# Patient Record
Sex: Male | Born: 1935 | Race: White | Hispanic: No | Smoking: Former smoker
Health system: Southern US, Community
[De-identification: ages and names within clinical notes are randomized; demographics above are authoritative.]

## PROBLEM LIST (undated history)

## (undated) DIAGNOSIS — R112 Nausea with vomiting, unspecified: Secondary | ICD-10-CM

## (undated) DIAGNOSIS — I951 Orthostatic hypotension: Secondary | ICD-10-CM

## (undated) DIAGNOSIS — Z9989 Dependence on other enabling machines and devices: Secondary | ICD-10-CM

## (undated) DIAGNOSIS — Z950 Presence of cardiac pacemaker: Secondary | ICD-10-CM

## (undated) DIAGNOSIS — I714 Abdominal aortic aneurysm, without rupture, unspecified: Secondary | ICD-10-CM

## (undated) DIAGNOSIS — Z9889 Other specified postprocedural states: Secondary | ICD-10-CM

## (undated) DIAGNOSIS — M549 Dorsalgia, unspecified: Secondary | ICD-10-CM

## (undated) DIAGNOSIS — K219 Gastro-esophageal reflux disease without esophagitis: Secondary | ICD-10-CM

## (undated) DIAGNOSIS — I10 Essential (primary) hypertension: Secondary | ICD-10-CM

## (undated) DIAGNOSIS — G4730 Sleep apnea, unspecified: Secondary | ICD-10-CM

## (undated) DIAGNOSIS — M84376A Stress fracture, unspecified foot, initial encounter for fracture: Secondary | ICD-10-CM

## (undated) DIAGNOSIS — M7989 Other specified soft tissue disorders: Secondary | ICD-10-CM

## (undated) DIAGNOSIS — I4891 Unspecified atrial fibrillation: Secondary | ICD-10-CM

## (undated) DIAGNOSIS — N400 Enlarged prostate without lower urinary tract symptoms: Secondary | ICD-10-CM

## (undated) DIAGNOSIS — G8929 Other chronic pain: Secondary | ICD-10-CM

## (undated) HISTORY — DX: Unspecified atrial fibrillation: I48.91

## (undated) HISTORY — DX: Orthostatic hypotension: I95.1

## (undated) HISTORY — PX: HERNIA REPAIR: SHX51

## (undated) HISTORY — PX: SHOULDER SURGERY: SHX246

## (undated) HISTORY — PX: WRIST FRACTURE SURGERY: SHX121

## (undated) HISTORY — DX: Abdominal aortic aneurysm, without rupture: I71.4

## (undated) HISTORY — DX: Abdominal aortic aneurysm, without rupture, unspecified: I71.40

## (undated) HISTORY — DX: Dependence on other enabling machines and devices: Z99.89

## (undated) HISTORY — DX: Presence of cardiac pacemaker: Z95.0

---

## 2003-02-22 ENCOUNTER — Inpatient Hospital Stay (HOSPITAL_COMMUNITY): Admission: AD | Admit: 2003-02-22 | Discharge: 2003-02-23 | Payer: Self-pay | Admitting: *Deleted

## 2003-09-20 ENCOUNTER — Encounter: Admission: RE | Admit: 2003-09-20 | Discharge: 2003-09-20 | Payer: Self-pay | Admitting: Orthopaedic Surgery

## 2003-11-10 ENCOUNTER — Ambulatory Visit (HOSPITAL_COMMUNITY): Admission: RE | Admit: 2003-11-10 | Discharge: 2003-11-10 | Payer: Self-pay | Admitting: Orthopaedic Surgery

## 2003-11-10 ENCOUNTER — Ambulatory Visit (HOSPITAL_BASED_OUTPATIENT_CLINIC_OR_DEPARTMENT_OTHER): Admission: RE | Admit: 2003-11-10 | Discharge: 2003-11-10 | Payer: Self-pay | Admitting: Orthopaedic Surgery

## 2003-12-21 ENCOUNTER — Ambulatory Visit: Payer: Self-pay | Admitting: Cardiology

## 2004-01-19 ENCOUNTER — Ambulatory Visit: Payer: Self-pay | Admitting: Cardiology

## 2004-02-16 ENCOUNTER — Ambulatory Visit: Payer: Self-pay | Admitting: Cardiology

## 2004-03-15 ENCOUNTER — Ambulatory Visit: Payer: Self-pay | Admitting: Cardiology

## 2004-03-30 ENCOUNTER — Ambulatory Visit (HOSPITAL_BASED_OUTPATIENT_CLINIC_OR_DEPARTMENT_OTHER): Admission: RE | Admit: 2004-03-30 | Discharge: 2004-03-30 | Payer: Self-pay | Admitting: Orthopaedic Surgery

## 2004-03-30 ENCOUNTER — Ambulatory Visit (HOSPITAL_COMMUNITY): Admission: RE | Admit: 2004-03-30 | Discharge: 2004-03-30 | Payer: Self-pay | Admitting: Orthopaedic Surgery

## 2004-04-12 ENCOUNTER — Ambulatory Visit: Payer: Self-pay | Admitting: Cardiology

## 2004-04-19 ENCOUNTER — Ambulatory Visit: Payer: Self-pay | Admitting: Cardiology

## 2004-04-26 ENCOUNTER — Ambulatory Visit: Payer: Self-pay | Admitting: Cardiology

## 2004-05-23 ENCOUNTER — Ambulatory Visit: Payer: Self-pay | Admitting: Cardiology

## 2004-05-30 ENCOUNTER — Ambulatory Visit: Payer: Self-pay | Admitting: Cardiology

## 2004-06-13 ENCOUNTER — Ambulatory Visit: Payer: Self-pay | Admitting: Cardiology

## 2004-06-22 ENCOUNTER — Ambulatory Visit: Payer: Self-pay | Admitting: Cardiology

## 2004-06-28 ENCOUNTER — Ambulatory Visit: Payer: Self-pay | Admitting: Cardiology

## 2004-07-06 ENCOUNTER — Ambulatory Visit: Payer: Self-pay | Admitting: Cardiology

## 2004-07-18 ENCOUNTER — Ambulatory Visit: Payer: Self-pay | Admitting: Cardiology

## 2004-08-01 ENCOUNTER — Ambulatory Visit: Payer: Self-pay | Admitting: Cardiology

## 2004-08-08 ENCOUNTER — Ambulatory Visit: Payer: Self-pay | Admitting: Cardiology

## 2004-08-22 ENCOUNTER — Ambulatory Visit: Payer: Self-pay | Admitting: Cardiology

## 2004-09-19 ENCOUNTER — Ambulatory Visit: Payer: Self-pay | Admitting: Cardiology

## 2004-10-03 ENCOUNTER — Ambulatory Visit: Payer: Self-pay | Admitting: Cardiology

## 2004-10-24 ENCOUNTER — Ambulatory Visit: Payer: Self-pay | Admitting: Cardiology

## 2004-11-21 ENCOUNTER — Ambulatory Visit: Payer: Self-pay | Admitting: Cardiology

## 2004-11-28 ENCOUNTER — Ambulatory Visit: Payer: Self-pay | Admitting: Cardiology

## 2004-12-26 ENCOUNTER — Ambulatory Visit: Payer: Self-pay | Admitting: Cardiology

## 2005-01-23 ENCOUNTER — Ambulatory Visit: Payer: Self-pay | Admitting: Cardiology

## 2005-02-20 ENCOUNTER — Ambulatory Visit: Payer: Self-pay | Admitting: Cardiology

## 2005-03-20 ENCOUNTER — Ambulatory Visit: Payer: Self-pay | Admitting: Cardiology

## 2005-04-16 ENCOUNTER — Ambulatory Visit: Payer: Self-pay | Admitting: Cardiology

## 2005-05-15 ENCOUNTER — Ambulatory Visit: Payer: Self-pay | Admitting: Cardiology

## 2005-05-17 ENCOUNTER — Ambulatory Visit (HOSPITAL_COMMUNITY): Admission: RE | Admit: 2005-05-17 | Discharge: 2005-05-17 | Payer: Self-pay | Admitting: Unknown Physician Specialty

## 2005-05-20 ENCOUNTER — Ambulatory Visit: Payer: Self-pay | Admitting: Cardiology

## 2005-06-13 ENCOUNTER — Ambulatory Visit: Payer: Self-pay | Admitting: Cardiology

## 2005-07-15 ENCOUNTER — Ambulatory Visit: Payer: Self-pay | Admitting: Cardiology

## 2005-07-25 ENCOUNTER — Ambulatory Visit: Payer: Self-pay | Admitting: Cardiology

## 2006-03-12 ENCOUNTER — Encounter: Payer: Self-pay | Admitting: Cardiology

## 2006-05-09 ENCOUNTER — Encounter: Payer: Self-pay | Admitting: Cardiology

## 2006-08-26 ENCOUNTER — Ambulatory Visit: Payer: Self-pay | Admitting: Cardiology

## 2007-09-21 ENCOUNTER — Ambulatory Visit: Payer: Self-pay | Admitting: Cardiology

## 2008-02-22 ENCOUNTER — Encounter: Payer: Self-pay | Admitting: Cardiology

## 2008-02-25 ENCOUNTER — Encounter: Payer: Self-pay | Admitting: Cardiology

## 2008-04-08 ENCOUNTER — Encounter: Payer: Self-pay | Admitting: Cardiology

## 2008-04-12 ENCOUNTER — Ambulatory Visit: Payer: Self-pay | Admitting: Cardiology

## 2008-04-24 ENCOUNTER — Encounter: Payer: Self-pay | Admitting: Cardiology

## 2008-06-13 ENCOUNTER — Ambulatory Visit: Payer: Self-pay | Admitting: Cardiology

## 2008-09-21 DIAGNOSIS — I714 Abdominal aortic aneurysm, without rupture, unspecified: Secondary | ICD-10-CM | POA: Insufficient documentation

## 2008-09-21 DIAGNOSIS — I951 Orthostatic hypotension: Secondary | ICD-10-CM | POA: Insufficient documentation

## 2008-09-21 DIAGNOSIS — I4891 Unspecified atrial fibrillation: Secondary | ICD-10-CM | POA: Insufficient documentation

## 2008-11-17 ENCOUNTER — Encounter: Payer: Self-pay | Admitting: Cardiology

## 2008-11-17 DIAGNOSIS — R935 Abnormal findings on diagnostic imaging of other abdominal regions, including retroperitoneum: Secondary | ICD-10-CM

## 2008-12-26 ENCOUNTER — Encounter: Payer: Self-pay | Admitting: Cardiology

## 2009-01-24 ENCOUNTER — Ambulatory Visit: Payer: Self-pay | Admitting: Cardiology

## 2009-01-24 DIAGNOSIS — K449 Diaphragmatic hernia without obstruction or gangrene: Secondary | ICD-10-CM | POA: Insufficient documentation

## 2009-01-24 DIAGNOSIS — E785 Hyperlipidemia, unspecified: Secondary | ICD-10-CM

## 2009-02-03 ENCOUNTER — Encounter: Payer: Self-pay | Admitting: Cardiology

## 2009-02-09 ENCOUNTER — Encounter: Payer: Self-pay | Admitting: Cardiology

## 2009-02-20 ENCOUNTER — Encounter (INDEPENDENT_AMBULATORY_CARE_PROVIDER_SITE_OTHER): Payer: Self-pay | Admitting: *Deleted

## 2009-03-03 ENCOUNTER — Encounter: Payer: Self-pay | Admitting: Cardiology

## 2009-03-30 ENCOUNTER — Ambulatory Visit: Payer: Self-pay | Admitting: Vascular Surgery

## 2009-04-05 ENCOUNTER — Encounter: Payer: Self-pay | Admitting: Vascular Surgery

## 2009-04-06 ENCOUNTER — Encounter: Payer: Self-pay | Admitting: Cardiology

## 2009-04-17 ENCOUNTER — Encounter: Payer: Self-pay | Admitting: Cardiology

## 2009-05-10 ENCOUNTER — Ambulatory Visit: Payer: Self-pay | Admitting: Cardiology

## 2009-05-10 DIAGNOSIS — R609 Edema, unspecified: Secondary | ICD-10-CM | POA: Insufficient documentation

## 2009-11-07 ENCOUNTER — Telehealth (INDEPENDENT_AMBULATORY_CARE_PROVIDER_SITE_OTHER): Payer: Self-pay | Admitting: *Deleted

## 2009-11-14 ENCOUNTER — Ambulatory Visit: Payer: Self-pay | Admitting: Cardiology

## 2009-11-23 ENCOUNTER — Ambulatory Visit (HOSPITAL_COMMUNITY): Admission: RE | Admit: 2009-11-23 | Discharge: 2009-11-23 | Payer: Self-pay | Admitting: Urology

## 2009-12-04 ENCOUNTER — Ambulatory Visit: Payer: Self-pay | Admitting: Cardiology

## 2010-01-28 HISTORY — PX: CHOLECYSTECTOMY: SHX55

## 2010-02-27 NOTE — Assessment & Plan Note (Addendum)
Summary: 6 mo fu   Visit Type:  Follow-up Primary Provider:  [facility]   History of Present Illness: the patient is an 77 year old male with no prior history of coronary artery disease. The patient had a catheterization 2005 which showed nonobstructive coronary disease. He had a recent ER visit with complaints of palpitations, dizziness and chest pain. He does have a history of orthostatic hypotension. He also has a history of paroxysmal A. fib ablation. The patient however was in normal sinus rhythm. His episode occurred in the middle of the night and he took a nitroglycerin tablet. This made him quite hypotensive. Laboratory work done in the ER was essentially within normal limits and the patient was set up for an outpatient stress test with his primary care physician. Myocardial perfusion imaging showed probably normal LV perfusion with an ejection fraction of 69% and 2 small fixed defects but no ischemia. The patient achieved 7 METs.  The patient also has a history of AAA for which he sees [place] for surgery. He has a history of mild leg edema and was given a prescription for a limited time of hydrochlorothiazide. Patient since his ER visit has had no recurrent palpitations. He also has had no shortness of breath.  Preventive Screening-Counseling & Management  Alcohol-Tobacco     Smoking Status: quit     Year Quit: 1956  Current Medications (verified): 1)  Nexium 40 Mg Cpdr (Esomeprazole Magnesium) .... Take 1 Tablet By Mouth Once A Day 2)  Aspirin 81 Mg Tbec (Aspirin) .... Take One Tablet By Mouth Daily 3)  Dorzolamide Hcl 2 % Soln (Dorzolamide Hcl) .... One Drop in The Right Eye Once Daily 4)  Xalatan 0.005 % Soln (Latanoprost) .... One Drop in The Right Eye Two Times A Day 5)  Vitamin D 400 Unit Caps (Cholecalciferol) .... Take 2 Tablet By Mouth Once A Day 6)  Proscar 5 Mg Tabs (Finasteride) .... Take 1 Tablet By Mouth Once A Day 7)  Nitrostat 0.4 Mg Subl (Nitroglycerin) .Marland Kitchen.. 1 Tablet Under  Tongue At Onset of Chest Pain; You May Repeat Every 5 Minutes For Up To 3 Doses. 8)  Multivitamins   Tabs (Multiple Vitamin) .Marland Kitchen.. 1 By Mouth Daily 9)  Fish Oil 1000 Mg Caps (Omega-3 Fatty Acids) .... Take 2 Tablet By Mouth Two Times A Day 10)  Pindolol 5 Mg Tabs (Pindolol) .... Take 1/2 Tab (2.5mg ) Daily  Allergies: 1)  ! Crestor 2)  ! Sulfa  Comments:  Nurse/Medical Assistant: The patient's medication list and allergies were reviewed with the patient and were updated in the Medication and Allergy Lists.  Past History:  Family History: Last updated: 01/24/2009 [place]  Social History: Last updated: 09/21/2008 Tobacco Use - No.  Alcohol Use - no  Risk Factors: Smoking Status: quit (12/04/2009)  Past Medical History: ORTHOSTATIC HYPOTENSION (ICD-458.0) ABDOMINAL AORTIC ANEURYSM (ICD-441.4) ATRIAL FIBRILLATION (ICD-427.31) Bicarbonate perfusion study October 2011 ejection fraction 69%. Small defects no ischemia. 7 metastases achieved. ER visit 1014 2011 ruled out for MI normal sinus rhythm orthostasis after sublingual nitroglycerin.    Review of Systems       The patient complains of chest pain, palpitations, and leg swelling.  The patient denies fatigue, malaise, fever, weight gain/loss, vision loss, decreased hearing, hoarseness, shortness of breath, prolonged cough, wheezing, sleep apnea, coughing up blood, abdominal pain, blood in stool, nausea, vomiting, diarrhea, heartburn, incontinence, blood in urine, muscle weakness, joint pain, rash, skin lesions, headache, fainting, dizziness, depression, anxiety, enlarged lymph nodes, easy bruising  or bleeding, and environmental allergies.    Vital Signs:  Patient profile:   78 year old male Height:      68 inches Weight:      191 pounds Pulse rate:   60 / minute Pulse (ortho):   70 / minute BP sitting:   137 / 82  (left arm) BP standing:   135 / 78 Cuff size:   large  Vitals Entered By: Carlye Grippe (December 04, 2009 8:47  AM)  Serial Vital Signs/Assessments:  Time      Position  BP       Pulse  Resp  Temp     By 9:31 AM   Lying RA  138/84   59                    Gayle [address] LPN 0:45 AM   Sitting   135/85   61                    [place], LPN 4:09 AM   Standing  135/78   70                    [place], LPN  Comments: 8:11 AM After 3 min:  131/81  66 After 5 min:  135/81  65 By: Hoover Brunette, LPN    Physical Exam  Additional Exam:  General: Well-developed, well-nourished in no distress head: Normocephalic and atraumatic eyes PERRLA/EOMI intact, conjunctiva and lids normal nose: No deformity or lesions mouth normal dentition, normal posterior pharynx neck: Supple, no JVD.  No masses, thyromegaly or abnormal cervical nodes lungs: Normal breath sounds bilaterally without wheezing.  Normal percussion heart: regular rate and rhythm with normal S1 and S2, no S3 or S4.  PMI is normal.  No pathological murmurs abdomen: Normal bowel sounds, abdomen is soft and nontender without masses, organomegaly or hernias noted.  No hepatosplenomegaly musculoskeletal: Back normal, normal gait muscle strength and tone normal pulsus: Pulse is normal in all 4 extremities Extremities: very mild low pitting edema of the lower extremities. neurologic: Alert and oriented x 3 skin: Intact without lesions or rashes cervical nodes: No significant adenopathy psychologic: Normal affect    Impression & Recommendations:  Problem # 1:  ORTHOSTATIC HYPOTENSION (ICD-458.0) patient hypotension after nitroglycerin. We will check orthostatics.  Problem # 2:  ATRIAL FIBRILLATION (ICD-427.31) patient reports palpitations. There is no evidence of recurrent atrial fibrillation. However he could benefit from a low dose prescription which pindolol 2.5 mg p.o. q. day Iwith the intrinsic sympathomimetic activity given his heart rate of 60 beats per minute.the patient is currently not on Coumadin. His updated medication list for this  problem includes:    Aspirin 81 Mg Tbec (Aspirin) .Marland Kitchen... Take one tablet by mouth daily    Pindolol 5 Mg Tabs (Pindolol) .Marland Kitchen... Take 1/2 tab (2.5mg ) daily  Problem # 3:  ABDOMINAL AORTIC ANEURYSM (ICD-441.4) follow up after surgery in [place]. Last measurement was 4.1 cm.  Patient Instructions: 1)  Pindolol 2.5mg  once daily  2)  Follow up in  6 months Prescriptions: PINDOLOL 5 MG TABS (PINDOLOL) take 1/2 tab (2.5mg ) daily  #15 x 6   Entered by:   Hoover Brunette, LPN   Authorized by:   Lewayne Bunting, MD, [facility]   Signed by:   Hoover Brunette, LPN on 91/47/8295   Method used:   Electronically to        [facility]* (retail)  [address]       [place], [place]  [address]       Ph: [phone]       Fax: [phone]   RxID:   [id number]   Handout requested.   Appended Document: 6 mo fu Patient at low risk for cardiovascular complications related to galbladder surgery. Can proceed with any furhter cardiac diagnostic testing.   Appended Document: 6 mo fu Correction on above.  Per Dr. Andee Lineman - can proceed without any further cardiac diagnostic testing.

## 2010-02-27 NOTE — Letter (Signed)
Summary: Engineer, materials at [facility]  [address]   [place], [place] [address]   Phone: [phone]  Fax: [phone]        February 20, 2009 MRN: [id number]   [name] [address] [place], [place]  [address]   Dear [name],  Your test ordered by Selena Batten has been reviewed by your physician (or physician assistant) and was found to be normal or stable. Your physician (or physician assistant) felt no changes were needed at this time.  ____ Echocardiogram  ____ Cardiac Stress Test  __X__ Lab Work  ____ Peripheral vascular study of arms, legs or neck  ____ CT scan or X-ray  ____ Lung or Breathing test  ____ Other:   Thank you.   Hoover Brunette, LPN    Duane Boston, M.D., F.A.C.C. Thressa Sheller, M.D., F.A.C.C. Oneal Grout, M.D., F.A.C.C. Cheree Ditto, M.D., F.A.C.C. Daiva Nakayama, M.D., F.A.C.C. Kenney Houseman, M.D., F.A.C.C. Jeanne Ivan, PA-C

## 2010-02-27 NOTE — Letter (Signed)
Summary: Appointment -missed  [facility] [address]   [place], [place] [address]   Phone: [phone]  Fax: [phone]     April 06, 2009 MRN: [id number]     [name] [address] [place], [place]  [address]     Dear [name],  Our records indicate you missed your appointment on April 06, 2009                        with Dr. Andee Lineman.   It is very important that we reach you to reschedule this appointment. We look forward to participating in your health care needs.   Please contact us at the number listed above at your earliest convenience to reschedule this appointment.   Sincerely,    Glass blower/designer

## 2010-02-27 NOTE — Assessment & Plan Note (Signed)
Summary: 3 MO FU R/S FROM NO SHOW   Visit Type:  Follow-up Primary Provider:  [facility]  CC:  Edema Lower extremities.  History of Present Illness: the patient is a 77 year old male history paroxysmal atrial fibrillation, history of DVT and PE. The patient also has a history of abdominal aortic aneurysm. Because of a flank pain and right groin pain a repeat CT scan was done. He was radiologist's impression that the patient's aneurysm had increased in size to 3.9 cm. However his most recent CT scan the patient had an aneurysm size of approximately 4.1 cm and therefore no definite change. There was evidence of thrombosis in the aneurysm but no evidence of dissection. The patient was seen in followup by vascular surgery. They felt his aneurysm was stable and recommended further followup in our practice. The patient denies any central chest pain shortness of breath orthopnea PND palpitations or syncope. He underwent a recent endoscopy by Dr. Karilyn Cota. The patient reports some lower extremity edema over the last several weeks. Otherwise he has remained stable from a cardiovascular perspective. He does report chronic back pain.  Clinical Review Panels:  Echocardiogram Echocardiogram 1. The left ventricular chamber size is normal.         2. Global left ventricular wall motion and contractility are within         normal limits.         3. The estimated ejection fraction is 60-65%.          4. The right ventricular global systolic function is normal.         5. The aortic valve structure is normal. (04/12/2008)  Cardiac Imaging Cardiac Cath Findings  1. Nonobstructive disease.  2. Atrial fibrillation.   PLAN:  Start the patient on the Diltiazem drip for rate control.  Consider  starting him on a permanent rate control agent as well as Coumadin and  attempt at DC cardioversion with heparin coverage over the next 24-hour  period.                                             Vida Roller, M.D.  (02/22/2003)    Current Medications (verified): 1)  Nexium 40 Mg Cpdr (Esomeprazole Magnesium) .... Take 1 Tablet By Mouth Two Times A Day  (Place On File) 2)  Aspirin 81 Mg Tbec (Aspirin) .... Take One Tablet By Mouth Daily 3)  Dorzolamide Hcl 2 % Soln (Dorzolamide Hcl) .... One Drop in The Right Eye Once Daily 4)  Xalatan 0.005 % Soln (Latanoprost) .... One Drop in The Right Eye Two Times A Day 5)  Vitamin D 1000 Unit Tabs (Cholecalciferol) .... One Tablet Two Times A Day 6)  Proscar 5 Mg Tabs (Finasteride) .... Take 1 Tablet By Mouth Once A Day 7)  Simvastatin 20 Mg Tabs (Simvastatin) .... Take 1 Tab By Mouth At Bedtime 8)  Nitrostat 0.4 Mg Subl (Nitroglycerin) .Marland Kitchen.. 1 Tablet Under Tongue At Onset of Chest Pain; You May Repeat Every 5 Minutes For Up To 3 Doses. 9)  Multivitamins   Tabs (Multiple Vitamin) .Marland Kitchen.. 1 By Mouth Daily 10)  Carafate 1 Gm Tabs (Sucralfate) .... As Directed  Allergies (verified): 1)  ! Crestor  Past History:  Past Medical History: Last updated: 09/21/2008 ORTHOSTATIC HYPOTENSION (ICD-458.0) ABDOMINAL AORTIC ANEURYSM (ICD-441.4) ATRIAL FIBRILLATION (ICD-427.31)    Review of Systems  The patient complains of abdominal pain and leg swelling.  The patient denies fatigue, malaise, fever, weight gain/loss, vision loss, decreased hearing, hoarseness, chest pain, palpitations, shortness of breath, prolonged cough, wheezing, sleep apnea, coughing up blood, blood in stool, nausea, vomiting, diarrhea, heartburn, incontinence, blood in urine, muscle weakness, joint pain, rash, skin lesions, headache, fainting, dizziness, depression, anxiety, enlarged lymph nodes, easy bruising or bleeding, and environmental allergies.    Vital Signs:  Patient profile:   77 year old male Height:      68 inches Weight:      188 pounds BMI:     28.69 Pulse rate:   58 / minute Resp:     16 per minute BP sitting:   137 / 80  (right arm)  Vitals Entered By: Marrion Coy, CNA  (May 10, 2009 10:54 AM)  Physical Exam  Additional Exam:  General: Well-developed, well-nourished in no distress head: Normocephalic and atraumatic eyes PERRLA/EOMI intact, conjunctiva and lids normal nose: No deformity or lesions mouth normal dentition, normal posterior pharynx neck: Supple, no JVD.  No masses, thyromegaly or abnormal cervical nodes lungs: Normal breath sounds bilaterally without wheezing.  Normal percussion heart: regular rate and rhythm with normal S1 and S2, no S3 or S4.  PMI is normal.  No pathological murmurs abdomen: Normal bowel sounds, abdomen is soft and nontender without masses, organomegaly or hernias noted.  No hepatosplenomegaly musculoskeletal: Back normal, normal gait muscle strength and tone normal pulsus: Pulse is normal in all 4 extremities Extremities: No peripheral pitting edema neurologic: Alert and oriented x 3 skin: Intact without lesions or rashes cervical nodes: No significant adenopathy psychologic: Normal affect    EKG  Procedure date:  05/10/2009  Findings:      sinus bradycardia. Nonspecific ST-T wave changes.  Impression & Recommendations:  Problem # 1:  ABDOMINAL AORTIC ANEURYSM (ICD-441.4) the patient has a stable aortic aneurysm. There is no evidence of dissection. His right flank and groin pain appears to be related to T11 T. 12 disc narrowing. The patient will have a followup abdominal ultrasound for AAA in 6 months and thereafter every 6 months.  Problem # 2:  HIATAL HERNIA WITH REFLUX (ICD-553.3) followed by gastroenterology His updated medication list for this problem includes:    Nexium 40 Mg Cpdr (Esomeprazole magnesium) .Marland Kitchen... Take 1 tablet by mouth two times a day  (place on file)    Carafate 1 Gm Tabs (Sucralfate) .Marland Kitchen... As directed  Problem # 3:  ATRIAL FIBRILLATION (ICD-427.31) no evidence of recurrent 8 fibrillation. The patient's electrocardiogram was reviewed and was notable for sinus bradycardia. His updated  medication list for this problem includes:    Aspirin 81 Mg Tbec (Aspirin) .Marland Kitchen... Take one tablet by mouth daily  Orders: EKG w/ Interpretation (93000)  Problem # 4:  LEG EDEMA (ICD-782.3) have given the patient a short-term prescription of low-dose hydrochlorothiazide 12.5 mg p.o. q. daily  Other Orders: T-Ultrasound Abdominal, Limited ([address])  Patient Instructions: 1)  In about six months just before your next visit, your physician has requested that you have an abdominal aorta duplex. During this test, an ultrasound is used to evaluate the aorta. Allow 30 minutes for this exam. Do not eat after midnight the day before and avoid carbonated beverages. There are no restrictions or special instructions. Lynden Ang will call and schedule this for you. Please call her in six months if you don't hear from [place].  2)  Your physician has recommended you make the following change in  your medication: START HYDROCHLORATHIAZIDE 12.5MG  DAILY TIMES 4 WEEKS ONLY. Please continue all other meds as before. Your new prescription has been sent to your pharmacy. 3)  Your physician wants you to follow-up in:6 months. You will receive a reminder letter in the mail about two months in advance. If you don't receive a letter, please call our office to schedule the follow-up appointment. Prescriptions: HYDROCHLOROTHIAZIDE 12.5 MG CAPS (HYDROCHLOROTHIAZIDE) Take 1 tablet by mouth once a day times 4 weeks only then stop  #28 x 0   Entered by:   Carlye Grippe   Authorized by:   Lewayne Bunting, MD, [facility]   Signed by:   Carlye Grippe on 05/10/2009   Method used:   Electronically to        [facility]* (retail)       [address]       [place], [place]  [address]       Ph: [phone]       Fax: [phone]   RxID:   [id number]

## 2010-02-27 NOTE — Letter (Signed)
Summary: Andree Coss NP CONSULT/ DR. Sophiea Ueda  V V NP CONSULT/ DR. Cyla Haluska   Imported By: Zachary George 04/05/2009 14:16:42  _____________________________________________________________________  External Attachment:    Type:   Image     Comment:   External Document

## 2010-02-27 NOTE — Letter (Signed)
Summary: [facility] Correspondence/ [facility]  External Correspondence/ [facility]   Imported By: Dorise Hiss 02/14/2009 12:29:57  _____________________________________________________________________  External Attachment:    Type:   Image     Comment:   External Document

## 2010-02-27 NOTE — Progress Notes (Signed)
Summary: BLOOD PRESSURE ISSUES, SOB   Phone Note Call from Patient Call back at Home Phone [phone] Call back at [phone]   Caller: RHONDA  Reason for Call: Talk to Nurse Summary of Call: BLOOD PRESSURE RANGES FROM  140/88 - 188/87  SINCE LAST WEDNESDAY. SAID THAT IT USUALLY RUNS 120/65.  WOULD LIKE TO SEE DOCTOR TODAY ABOUT THIS Initial call taken by: Claudette Laws,  November 07, 2009 8:14 AM  Follow-up for Phone Call        Last Wednesday had chest pain that lasted 10-15 while playing golf.  After pain went away, was able to continue with game.   Has episode over weekend with SOB with walking to car about 1/4 mile.   These symptoms are new to him and usually does not have any problems.   Has OV scheduled for 11/7. Follow-up by: Hoover Brunette, LPN,  November 07, 2009 1:47 PM  Additional Follow-up for Phone Call Additional follow up Details #1::        start him onlabetaloll 100 mg p.o. b.i.d. and needs to be seen next week in the office Additional Follow-up by: Lewayne Bunting, MD, [facility],  November 10, 2009 11:57 AM    Additional Follow-up for Phone Call Additional follow up Details #2::    Went to PMD ([facility]) on 10/11 & he put on HCTZ.  Continued to not feel good.  Heart rate was up to 98 and dizzy.  Went to ED at [facility] around 3:30 that morning and MD there told him to d/c HCTZ.   Did some lab and EKG, sent home.  States he is scheduled for stress test on 10/18 at 11:00 - ordered by PMD.  Maryruth Bun)  Stated his blood pressure was back down to normal now, so I did not advise him to start Labetalol since he had ED visit.  Will scan in ED notes for your review.   Hoover Brunette, LPN  November 10, 2009 5:24 PM   OK, just make sure he has f/u in our office.   Follow-up by: Lewayne Bunting, MD, [facility],  November 15, 2009 12:52 PM  Additional Follow-up for Phone Call Additional follow up Details #3:: Details for Additional Follow-up Action Taken: Has OV scheduled for 11/7.  Additional Follow-up  by: Hoover Brunette, LPN,  November 17, 2009 3:39 PM

## 2010-03-08 ENCOUNTER — Encounter: Payer: Self-pay | Admitting: Cardiology

## 2010-03-14 ENCOUNTER — Encounter: Payer: Self-pay | Admitting: Cardiology

## 2010-03-19 ENCOUNTER — Encounter: Payer: Self-pay | Admitting: Cardiology

## 2010-03-21 ENCOUNTER — Encounter: Payer: Self-pay | Admitting: Cardiology

## 2010-03-27 ENCOUNTER — Encounter: Payer: Self-pay | Admitting: Cardiology

## 2010-03-28 ENCOUNTER — Telehealth (INDEPENDENT_AMBULATORY_CARE_PROVIDER_SITE_OTHER): Payer: Self-pay | Admitting: *Deleted

## 2010-04-03 ENCOUNTER — Encounter: Payer: Self-pay | Admitting: Cardiology

## 2010-04-05 ENCOUNTER — Other Ambulatory Visit: Payer: Self-pay | Admitting: General Surgery

## 2010-04-05 ENCOUNTER — Encounter (HOSPITAL_COMMUNITY): Payer: Medicare Other

## 2010-04-05 LAB — BASIC METABOLIC PANEL
Calcium: 9.2 mg/dL (ref 8.4–10.5)
Chloride: 105 mEq/L (ref 96–112)
Creatinine, Ser: 0.99 mg/dL (ref 0.4–1.5)
GFR calc Af Amer: >60 mL/min (ref >60)
GFR calc non Af Amer: >60 mL/min (ref >60)
Glucose, Bld: 129 mg/dL — ABNORMAL HIGH (ref 70–99)
Potassium: 4.3 mEq/L (ref 3.5–5.1)
Sodium: 139 mEq/L (ref 135–145)

## 2010-04-05 LAB — CBC
HCT: 42.9 % (ref 39.0–52.0)
Hemoglobin: 13.8 g/dL (ref 13.0–17.0)
MCHC: 32.2 g/dL (ref 30.0–36.0)
MCV: 86 fL (ref 78.0–100.0)
Platelets: 192 10*3/uL (ref 150–400)
RDW: 13.1 % (ref 11.5–15.5)
WBC: 7.3 10*3/uL (ref 4.0–10.5)

## 2010-04-05 LAB — SURGICAL PCR SCREEN
MRSA, PCR: NEGATIVE
Staphylococcus aureus: NEGATIVE

## 2010-04-05 NOTE — Letter (Signed)
Summary: External Correspondence/  DR. Lovell Sheehan  External Correspondence/  DR. Lovell Sheehan   Imported By: Dorise Hiss 03/28/2010 09:27:26  _____________________________________________________________________  External Attachment:    Type:   Image     Comment:   External Document

## 2010-04-05 NOTE — Medication Information (Signed)
Summary: RX Folder/  MED LIST DR. Joyce Gross Folder/  MED LIST DR. Lovell Sheehan   Imported By: Dorise Hiss 03/28/2010 09:31:09  _____________________________________________________________________  External Attachment:    Type:   Image     Comment:   External Document

## 2010-04-05 NOTE — Letter (Signed)
Summary: External Correspondence/  DR. Lovell Sheehan  External Correspondence/  DR. Lovell Sheehan   Imported By: Dorise Hiss 03/28/2010 40:98:11  _____________________________________________________________________  External Attachment:    Type:   Image     Comment:   External Document

## 2010-04-05 NOTE — Letter (Signed)
Summary: External Correspondence/  DR. Lovell Sheehan  External Correspondence/  DR. Lovell Sheehan   Imported By: Dorise Hiss 03/28/2010 81:19:14  _____________________________________________________________________  External Attachment:    Type:   Image     Comment:   External Document

## 2010-04-09 ENCOUNTER — Observation Stay (HOSPITAL_COMMUNITY)
Admission: RE | Admit: 2010-04-09 | Discharge: 2010-04-10 | Disposition: A | Discharge status: Home / Self Care | Payer: Medicare Other | Source: Ambulatory Visit | Attending: General Surgery | Admitting: General Surgery

## 2010-04-09 ENCOUNTER — Other Ambulatory Visit: Payer: Self-pay | Admitting: General Surgery

## 2010-04-09 DIAGNOSIS — Z01818 Encounter for other preprocedural examination: Secondary | ICD-10-CM | POA: Insufficient documentation

## 2010-04-09 DIAGNOSIS — Z01812 Encounter for preprocedural laboratory examination: Secondary | ICD-10-CM | POA: Insufficient documentation

## 2010-04-09 DIAGNOSIS — I714 Abdominal aortic aneurysm, without rupture, unspecified: Secondary | ICD-10-CM | POA: Insufficient documentation

## 2010-04-09 DIAGNOSIS — I10 Essential (primary) hypertension: Secondary | ICD-10-CM | POA: Insufficient documentation

## 2010-04-09 DIAGNOSIS — Z79899 Other long term (current) drug therapy: Secondary | ICD-10-CM | POA: Insufficient documentation

## 2010-04-09 DIAGNOSIS — K8020 Calculus of gallbladder without cholecystitis without obstruction: Principal | ICD-10-CM | POA: Insufficient documentation

## 2010-04-10 NOTE — Progress Notes (Signed)
Summary: cardiac clearance   Phone Note Other Incoming   Caller: FAX FROM DR. ZIEGLER - [facility] Summary of Call: Fax received requesting cardiac clearance. Initial call taken by: Hoover Brunette, LPN,  March 28, 2010 3:35 PM  Follow-up for Phone Call        Clearance is for gallbladder surgery.  Patient came by office today wanting to know if done yet.  States he would like to have this done as soon as possible, having alot of pain.  Vicky advised patient that we would send urgent note back to MD. Follow-up by: Hoover Brunette, LPN,  March 30, 2010 11:48 AM  Additional Follow-up for Phone Call Additional follow up Details #1::        Jan with [facility] calling to f/u on clearance  [phone]   [facility], LPN  April 01, 1608 5:01 PM     Additional Follow-up for Phone Call Additional follow up Details #2::    Patient at low risk from cardiovascular standpoint for galbladder surgery. Can proceed .also mader attachement to last office note.  Lewayne Bunting, MD, [facility]  April 03, 2010 5:48 AM   Note faxed.  Follow-up by: Hoover Brunette, LPN,  April 03, 2010 1:41 PM

## 2010-04-10 NOTE — Letter (Signed)
Summary: Letter/ FAXED CARDIAC CLEARANCE DR. ZIEGLER  Letter/ FAXED CARDIAC CLEARANCE DR. ZIEGLER   Imported By: Dorise Hiss 04/04/2010 16:26:26  _____________________________________________________________________  External Attachment:    Type:   Image     Comment:   External Document

## 2010-04-27 NOTE — H&P (Signed)
[name], [name] NO.:  [id number]  MEDICAL RECORD NO.:  [id number]           PATIENT TYPE:  LOCATION:                                 FACILITY:  PHYSICIAN:  Tilford Pillar, MD      DATE OF BIRTH:  15-Jul-1926  DATE OF ADMISSION: DATE OF DISCHARGE:  LH                             HISTORY & PHYSICAL   CHIEF COMPLAINTS:  Right upper quadrant epigastric abdominal pain.  HISTORY OF PRESENT ILLNESS:  The patient is an 76 year old male, who presented to my office with a history of increasing epigastric abdominal pain.  This occurred several times the previous week and with increasing episodes.  He stated that the last episode has occurred after eating barbecue, was having the most significant episode.  He denies any nausea or vomiting.  No change in his bowel movements.  No melena.  No hematochezia.  No jaundice.  He does have a significant family history of gallbladder disease.  PAST MEDICAL HISTORY:  Renal calculi, gastroesophageal reflux disease, benign prostatic hypertrophy, AAA measuring 3.4 on its last measurement, hypertension.  PREVIOUS SURGERIES:  Includes, herniorrhaphy.  MEDICATIONS:  Include aspirin, finasteride, Nexium, and dorzolamide eye drop.  ALLERGIES:  No known drug allergies.  SOCIAL HISTORY:  No tobacco.  Occasional alcohol.  No recreational drug use.  Occupation is retired.  FAMILY HISTORY:  Again, as mentioned above with history of gallbladder disease.  REVIEW OF SYSTEMS:  CONSTITUTIONAL:  Unremarkable.  EYES:  Unremarkable. EARS, NOSE, AND THROAT:  Unremarkable.  RESPIRATORY:  Unremarkable. CARDIOVASCULAR:  Unremarkable.  GASTROINTESTINAL:  Abdominal pain and indigestion.  GENITOURINARY:  Increased frequency.  MUSCULOSKELETAL: Arthralgias of the back and joints.  SKIN:  Unremarkable.  ENDOCRINE: Unremarkable.  NEURO:  Unremarkable.  PHYSICAL EXAM:  GENERAL:  The patient is an elderly-appearing male.  He is calm.  He is not in  any acute distress.  He is alert and oriented x3. HEENT:  Scalp:  No deformities or masses.  Eyes:  Pupils equal, round, reactive to light.  Extraocular movements are intact.  No scleral icterus or conjunctival pallor is noted.  Oral mucosa is pink.  Normal occlusion. NECK:  Trachea is midline.  No cervical lymphadenopathy.  No thyroid nodularity.  No goiter. PULMONARY:  Unlabored respiration.  No wheezes or crackles.  He is clear to auscultation bilaterally. CARDIOVASCULAR:  Regular rate and rhythm.  No murmurs or gallops.  He has 2+ radial, femoral, and dorsalis pedis pulses bilaterally. Initially, no carotid bruits are apparent. GASTROINTESTINAL:  Positive bowel sounds.  The abdomen is soft.  He has mild right upper quadrant abdominal tenderness.  No classic Eulah Pont sign is elicited.  He does have a small umbilical hernia.  No palpable pulsatile masses is apparent over to the abdomen. SKIN:  Warm and dry.  PERTINENT LABORATORY AND RADIOGRAPHIC STUDIES:  Right upper quadrant ultrasound did demonstrate positive stones within the gallbladder.  No gallbladder wall thickening or biliary tree dilatation.  ASSESSMENT AND PLAN:  Cholelithiasis and known abdominal aortic aneurysm disease.  At this time, I did discuss with the patient at length.  The risks, benefits, and alternatives  of laparoscopic possible open cholecystectomy including but not limited to risk of bleeding, infection, bile leak, small-bowel injury, common bile duct injury as well as the possibility of intraoperative cardiac, and pulmonary events. In addition, the patient was advised as he has a minimal expanding abdominal aortic aneurysm, currently less than 4 cm that would be advisable to proceed with a cholecystectomy at earlier rather than later.  At this time, cardiac clearance will be obtained.  We will plan to proceed above-mentioned laparoscopic cholecystectomy in his earliest convenience upon obtaining cardiac  clearance.     Tilford Pillar, MD     BZ/MEDQ  D:  04/08/2010  T:  04/08/2010  Job:  [id number]  Electronically Signed by Tilford Pillar MD on 04/27/2010 12:22:11 PM

## 2010-06-12 NOTE — Assessment & Plan Note (Signed)
[facility]                          [facility] NOTE   NAME:Michael, Fitzpatrick                     MRN:          [id number]  DATE:09/21/2007                            DOB:          03-25-1926    CARDIOLOGIST:  Learta Codding, MD,FACC   PRIMARY CARE PHYSICIAN:  Linward Foster   REASON FOR VISIT:  A 1-year followup.   HISTORY OF PRESENT ILLNESS:  [name] is a very pleasant 77 year old  male patient with a history of paroxysmal atrial fibrillation with a low  CHADS2 score of 1.  He has been maintained on aspirin therapy in the  past.  He had minimal luminal irregularities at cardiac catheterization  in 2005.  He has a small abdominal aortic aneurysm that has been  followed by serial ultrasounds.  The last scan done was in November 2008  and it measured 3.6 cm.  The patient, since being seen in July 2008,  developed a lower extremity DVT after fracturing his ankle.  He then  developed pulmonary emboli bilaterally.  He has been on Coumadin since  about November or December 2008.  In the office today, he notes he is  doing well.  He denies chest pain, shortness breath, syncope, near  syncope, cough, orthopnea, PND.  He continues to have some swelling in  the left leg.   CURRENT MEDICATIONS:  1. Nexium 40 mg daily.  2. Aspirin 81 mg daily.  3. Warfarin as directed and followed by Dr. Gerhard Munch.  4. Dorzolamide eye drops.  5. Xalatan eye drops.   PHYSICAL EXAMINATION:  GENERAL:  He is a well-nourished and well-  developed male.  VITAL SIGNS:  Blood pressure is 116/73, pulse 55, and weight 182.6  pounds.  HEENT:  Normal.  NECK:  Without JVD.  CARDIAC:  Normal S1 and S2 and regular rate and rhythm.  LUNGS:  Clear to auscultation bilaterally.  ABDOMEN:  Soft and nontender.  EXTREMITIES:  A 1+ edema bilaterally with the left being greater than  right.  NEUROLOGIC:  He is alert and oriented x3.  Cranial II through XII  grossly  intact.   Electrocardiogram reveals sinus rhythm at a rate of 54 and normal axes.  No acute changes.   ASSESSMENT AND PLAN:  1. Paroxysmal atrial fibrillation.  He is maintaining normal sinus      rhythm.  He requires only aspirin therapy, but he is currently on      Coumadin therapy secondary to a recent history of DVT and pulmonary      emboli.  From a cardiovascular standpoint, there is no reason for      him to be on aspirin and Coumadin therapy.  I have given him a note      to take to his primary care physician to see if aspirin can be      discontinued while he is on Coumadin.  Certainly when his Coumadin      is discontinued, he can restart aspirin at that time.  2. Deep vein thrombosis with bilateral pulmonary emboli.  He has  completed greater than 3 months of anticoagulation therapy and did      have a correctable cause for his DVT (i.e. leg trauma - fracture).      Management of, as well as the decision of if and when to      discontinue his Coumadin, will be per Dr. Gerhard Munch.  3. Abdominal aortic aneurysm.  His last ultrasound revealed an      aneurysm of 3.6 cm in November 2008.  We will repeat an ultrasound      in November or December of this year to reassess his aneurysm.   DISPOSITION:  The patient will follow up with Dr. Andee Lineman in 1 year or  sooner p.r.n.      Tereso Newcomer, PA-C  Electronically Signed      Learta Codding, MD,FACC  Electronically Signed   SW/MedQ  DD: 09/21/2007  DT: 09/22/2007  Job #: [id number]   cc:   Linward Foster

## 2010-06-12 NOTE — Consult Note (Signed)
NEW PATIENT CONSULTATION   [name], [name]  DOB:  Aug 31, 1926                                       03/30/2009  ZOXWR#:[id number]   I saw the patient in the office today to evaluate his small abdominal  aortic aneurysm.  He was referred by Dr. Cleophas Dunker.  This is a pleasant  77 year old gentleman who has been followed with a small aneurysm for a  couple of years by Dr. Andee Lineman.  He is followed by Dr. Cleophas Dunker and was  having some para lumbosacral pain.  This prompted a CT scan of the  abdomen and pelvis to further evaluate this.  He did have some narrowing  of the disk at T11-T12.  He had evidence of osteoarthritis.  It was felt  that his aneurysm had increased slightly in size compared to a previous  study based on the review by the radiologist and he had some eccentric  thrombus.  For this reason he was sent for vascular consultation.  Of  note, he does have a history of some chronic low back pain which has  been relatively stable.  Over the last few weeks he has had some mild  right lower quadrant pain which came on gradually and comes and goes.  There are no aggravating or alleviating factors.  The pain is fairly  mild.  He has had no associated nausea or vomiting.   PAST MEDICAL HISTORY:  Is significant for history of atrial fibrillation  in the past.  He denies any history of diabetes, hypertension,  hypercholesterolemia, history of previous myocardial infarction, history  of congestive heart failure or history of COPD.   FAMILY HISTORY:  He is unaware of any history of premature  cardiovascular disease.  He did have a brother who had an intracranial  aneurysm.   SOCIAL HISTORY:  He is single.  He has three children.  He quit tobacco  in 1956.  He does not drink alcohol on a regular basis.   REVIEW OF SYSTEMS:  GENERAL:  He has had no recent weight loss, weight  gain or problems with his appetite.  CARDIOVASCULAR:  He has had no  chest pain, chest  pressure, palpitations or arrhythmias.  He has had no  claudication, rest pain or nonhealing ulcers.  He has had no history of  stroke, TIAs or amaurosis fugax.  He did have a DVT in the left leg and  a pulmonary embolus in 2008 and was on Coumadin for a while for this.  GI:  He does have a history of reflux and hiatal hernia.  GU:  He has occasional urinary frequency.  MUSCULOSKELETAL:  He does have arthritis and back pain as described  above.  Pulmonary, neurologic, psychiatric, ENT, hematologic, integumentary  review of systems is unremarkable and is documented on the medical  history form in his chart.   I have reviewed his CT scan of the abdomen and pelvis which was done on  03/03/2009.  He does have a small infrarenal aneurysm which measures 3.9  cm in maximum diameter.  This was compared by the radiologist to a  previous study and showed some slight enlargement.  Of note, he was also  noted to have some diverticulosis in the descending and sigmoid colon.  He had left nephrolithiasis without hydronephrosis.   I have explained that generally we would  not consider elective repair of  the aneurysm unless it reached 5.5 cm in maximum diameter.  He is  followed closely by Dr. Andee Lineman who can continue to follow this.  We  would be happy to see him back at any time if he had any significant  enlargement of the aneurysm.  However, at this time the aneurysm remains  fairly small with really no worrisome features.     Di Kindle. Edilia Bo, M.D.  Electronically Signed   CSD/MEDQ  D:  03/30/2009  T:  03/31/2009  Job:  [id number]   cc:   Claude Manges. Cleophas Dunker, M.D.  Learta Codding, MD,FACC  Dr Quintin Alto

## 2010-06-12 NOTE — Assessment & Plan Note (Signed)
[facility]                          [facility] NOTE   NAME:[name], [name]                     MRN:          [id number]  DATE:06/13/2008                            DOB:          1926/03/19    REFERRING PHYSICIAN:  Linward Foster   HISTORY OF PRESENT ILLNESS:  The patient is a very pleasant 77 year old  male with a history paroxysmal atrial fibrillation, but with low CHADS2  score.  The patient has a history of DVT.  Most recently he was admitted  with chest pain.  He underwent stress testing which was negative.  He  also had a repeat CT scan which showed a recanalized pulmonary artery  and no recurrent DVT or pulmonary emboli.  The patient is currently off  Coumadin.  He does have known abdominal aortic aneurysm which is  followed closely.  The patient states that he has been doing well.  He  has had no recurrent palpitations or clinical evidence of atrial  fibrillation.  He has no orthopnea or PND.   MEDICATIONS:  1. Nexium 40 mg a day.  2. Aspirin 81 mg a day.  3. Dorzolamide eye drops.  4. Xalatan eye drops.   PHYSICAL EXAMINATION:  VITAL SIGNS:  Blood pressure 135/80, heart rate  60, and weight 188 pounds.  GENERAL:  A well-nourished white male in no apparent distress.  HEENT:  Pupils anisocoric.  Conjunctivae clear.  NECK:  Supple.  Normal carotid upstroke and no carotid bruits.  LUNGS:  Clear breath sounds bilaterally.  HEART:  Regular rate and rhythm.  Normal S1 and S2.  No murmurs, rubs,  or gallops.  ABDOMEN:  Soft and nontender.  No rebound or guarding.  Good bowel  sounds.  EXTREMITIES:  Evidence for postphlebitic syndrome with swelling of the  lower extremities, particularly in the left leg where he had prior DVT.  NEUROLOGIC:  The patient is alert and grossly nonfocal.   PROBLEMS:  1. Paroxysmal atrial fibrillation, stable and no recurrence.  2. History of deep vein thrombosis and pulmonary emboli.  No  recurrence off Coumadin.  3. 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  4. Abdominal aortic aneurysm.   PLAN:  1. The patient does report some symptoms of orthostasis when getting      up quickly, particularly on a hot day.  I told him to drink      adequate fluids, to consider compression stockings, but more      importantly to come up slowly when he resumes an erect position.  I      suspect venous pooling secondary to postphlebitic syndrome      particularly in the left leg.  2. The patient from a cardiac standpoint appears to be stable.  He had      stress test which was within normal limits.  3. The patient will need further followup for his abdominal aortic      aneurysm and we will schedule an ultrasound in 6 months.  The      patient's aneurysm did increase over a 59-month period from 3.7 to  4.1 cm.     Learta Codding, MD,FACC  Electronically Signed    GED/MedQ  DD: 06/13/2008  DT: 06/14/2008  Job #: [id number]   cc:   Linward Foster

## 2010-06-15 NOTE — Cardiovascular Report (Signed)
NAME:  [name], [name] NO.:  [id number]   MEDICAL RECORD NO.:  [id number]                   PATIENT TYPE:  OIB   LOCATION:  2899                                 FACILITY:  [facility]   PHYSICIAN:  Vida Roller, M.D.                DATE OF BIRTH:  1926/05/08   DATE OF PROCEDURE:  DATE OF DISCHARGE:                              CARDIAC CATHETERIZATION   PRIMARY CARE PHYSICIAN:  Dr. Eliberto Ivory, [place], [place].   CARDIOLOGIST:  Jonelle Sidle, M.D. [facility]   PROCEDURES PERFORMED:  1. Left-heart catheterization.  2. Selective coronary angiography.  3. Left ventriculography.  4. Attempted DC cardioversion, external.   HISTORY OF PRESENT ILLNESS:  [name] is a 77 year old gentleman who has  no known coronary artery disease.  He came in with chest pain to [facility] and  was transferred to [place] for a heart catheterization. He previously had a  perfusion study which showed no obstructive disease, normal left ventricular  function. He has a distant tobacco history, but no other risk factors.  He  was found to be in atrial fibrillation prior to the catheterization.  He had  been in sinus when he was at [facility], so it was less than 24 hours in atrial  fibrillation.   DESCRIPTION OF PROCEDURE:  After obtaining informed consent for both the  heart catheterization and the DC cardioversion, the patient was brought to  the cardiac catheterization laboratory in the fasting state.  There he was  prepped and draped in the usual sterile manner, and conscious sedation was  obtained using 2 mg of Versed and 50 micrograms of fentanyl in graduated  doses to achieve adequate conscious sedation both before and after the left  heart catheterization prior to the DC cardioversion.  The right femoral  artery was cannulated using the modified Seldinger technique with a 6-  [place], 10 cm sheath, and left heart catheterization was performed using a 6-  French Judkins left #4, 6-French  Judkins right #4, and 6-French angled  pigtail.  The angled pigtail was used for a left ventriculography done in  the 30-degree RAO view with a power injection.  At the conclusion of the  procedure, the catheters were removed.  The sheath was left in place, and  the patient had two attempts at DC cardioversion with a biphasic  defibrillator at 120 joules.  This was unsuccessful.  The patient remained  in atrial fibrillation.  He was moved back to the cardiac catheterization  holding area where the femoral artery sheath was removed.  Hemostasis was  obtained using direct manual pressure.  At the conclusion, there was no  evidence of ecchymosis or hematoma formation, and distal pulses were intact.  Total fluoroscopic time was 5.0 minutes.  Total ionized contrast 100 cc of  material.   RESULTS:  1. Aortic pressure 105/70.  2. Mean arterial pressure of 84 mmHg.  3. Left ventricular  pressure 102/14 with an end-diastolic pressure of 20     mmHg.   CORONARY ARTERIES:  1. The left main coronary artery is a moderate-caliber vessel which is     angiographically normal.  2. The left anterior descending coronary artery is a moderate-caliber vessel     which does not traverse the apex.  It has two moderate-caliber diagonals     and two very small diagonals, all of which are free of disease.  3. The circumflex coronary artery is a moderate-caliber, nondominant vessel     which has one large obtuse marginal and a small, atrial circumflex     branch.  The obtuse marginal bifurcates, has minimal luminal     irregularities.  The remainder of the circumflex is free of disease.  4. The right coronary artery is a large, dominant vessel with a relatively     large branching posterior descending, posterolateral system which is     angiographically free of disease.  5. The left ventriculogram shows an ejection fraction of 70% with no mitral     regurgitation and no wall motion abnormalities.    ASSESSMENT:  1. Nonobstructive disease.  2. Atrial fibrillation.   PLAN:  Start the patient on the Diltiazem drip for rate control.  Consider  starting him on a permanent rate control agent as well as Coumadin and  attempt at DC cardioversion with heparin coverage over the next 24-hour  period.                                               Vida Roller, M.D.    JH/MEDQ  D:  02/22/2003  T:  02/22/2003  Job:  [id number]

## 2010-06-15 NOTE — Assessment & Plan Note (Signed)
[facility]                          [facility] NOTE   NAME:Silversmith, NAJIB COLMENARES                     MRN:          [id number]  DATE:08/26/2006                            DOB:          [birth year]    REFERRING PHYSICIAN:  Weyman Pedro   HISTORY OF PRESENT ILLNESS:  The patient is a pleasant 76 year old male  with a history of atrial fibrillation. The patient is having normal  sinus rhythm. He has been doing well. He is status post hernia surgery.  He reports no chest pain, shortness of breath, orthopnea, PND. He has no  palpitations or syncope. On EKG he remains in normal sinus rhythm.   MEDICATIONS:  Nexium, Zetia, Vytorin, Uroxatral, and aspirin.   PHYSICAL EXAMINATION:  VITAL SIGNS: Blood pressure 126/80, heart rate is  57, weight 284 pounds.  NECK EXAM: Normal carotid upstrokes. No carotid bruits.  LUNGS: Clear.  HEART: Regular rate and rhythm. Normal S1, S2. No murmurs, rubs, or  gallops.  ABDOMEN: Soft.  EXTREMITY EXAM: No cyanosis, clubbing, or edema.  NEURO: Patient alert and oriented and grossly nonfocal.   PROBLEM LIST:  1. History of paroxysmal atrial fibrillation.      a.     Maintaining normal sinus rhythm.      b.     Previously on Coumadin anticoagulation discontinued due to       low CHADS score.  2. Nonobstructive coronary artery disease.  3. Normal left ventricular function.  4. History of right eye visual deficit  5. Gastroesophageal reflux disease.  6. Dyslipidemia.   PLAN:  1. The patient will have lipid panel drawn.  2. The patient will follow up in 1 year. He remains in normal sinus      rhythm, and cardiovascularly, he remains stable.     Learta Codding, MD,FACC  Electronically Signed    GED/MedQ  DD: 09/02/2006  DT: 09/02/2006  Job #: [id number]   cc:   [facility]

## 2010-06-15 NOTE — Op Note (Signed)
NAMEELHADJI, PECORE              ACCOUNT NO.:  [id number]   MEDICAL RECORD NO.:  [id number]          PATIENT TYPE:  AMB   LOCATION:  DSC                          FACILITY:  [facility]   PHYSICIAN:  Claude Manges. Whitfield, M.D.DATE OF BIRTH:  May 22, 1926   DATE OF PROCEDURE:  11/10/2003  DATE OF DISCHARGE:                                 OPERATIVE REPORT   PREOPERATIVE DIAGNOSIS:  1.  SLAP lesion right shoulder extending into biceps tendon.  2.  Impingement.  3.  Degenerative joint disease of distal clavicle, ie. acromioclavicular      joint.   POSTOPERATIVE DIAGNOSIS:  1.  Bucket handle tear of anterior glenoid labrum extending into biceps      tendon.  2.  Partial rotator cuff tear with synovitis.  3.  Impingement.  4.  Degenerative joint disease, acromioclavicular joint.   OPERATION PERFORMED:  1.  Arthroscopic debridement of anterior glenoid labrum, biceps tendon,      partial rotator cuff tear and synovitis.  2.  Arthroscopic subacromial decompression.  3.  Arthroscopic distal clavicle resection.   SURGEON:  Claude Manges. Cleophas Dunker, M.D.   ASSISTANT:  Jamelle Rushing, P.A.   ANESTHESIA:  General orotracheal anesthesia with supplemental interscalene  nerve block.   COMPLICATIONS:  None.   INDICATIONS FOR PROCEDURE:  The patient is a 77 year old gentleman who has  been experiencing problems with his right shoulder for approximately two to  three months.  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  He has tried numerous anti-inflammatory medicines without much  relief and continues to have pain to the point of compromise.  He has had an  MRI scan revealing degenerative arthropathy at the acromioclavicular joint  with a SLAP tear of the anterior glenoid labrum extending into the biceps  tendon.  There was no evidence of a rotator cuff tear.  He is now to have  arthroscopic  evaluation.   DESCRIPTION OF PROCEDURE:  With the patient comfortable on the operating  table and under general orotracheal anesthesia, and supplemental  interscalene nerve block, the patient was placed semisitting position with a  shoulder frame.  The right shoulder was then prepped with DuraPrep from the  base of the neck circumferentially below the elbow.  Sterile draping was  performed.   A marking pen was used to outline the acromion, the acromioclavicular joint  and the coracoid.  At a point a fingerbreadth posterior and medial to the  posterior angle of the acromion, a small stab wound was made.  The  arthroscope was easily placed into the shoulder joint.  There was no  effusion.  Diagnostic arthroscopy revealed a bucket handle tear extending  about 1 cm along the anterior glenoid labrum superiorly.  It did not extend  into the biceps tendon but there was a separate partial tear of the biceps  tendon about 1.5 cm distal to its take off on the superior glenoid rim.  Second portal was established anteriorly and the [employer]  shaver was  introduced to shave the bucket handle tear.  There was at least 50% of the  labrum still intact.  I also debrided the partial biceps tendon tear that  represented probably 20 to 25% of the tendon.  The remainder of the tendon  was intact.  There was a large partial rotator cuff tear which I also  debrided.  The ArthroCare wand was introduced to stabilize any further  fraying and stop any bleeding.  I did not see any appreciable chondromalacia  of the humeral head of the glenoid or loose bodies.   The cannula was then placed in the subacromial space anteriorly and the  arthroscope in the subacromial space posteriorly.  A third portal was  established in the lateral subacromial space.  An arthroscopic subacromial  decompression was performed with the arthrocare wand, the [place] shaver  and the 6 mm hooded bur.  A distal clavicle resection was  also performed  with the same instruments.  It had a nice dry field.  I also evaluated the  rotator cuff tear from the bursal surface and saw some fraying but did not  see any frank full thickness tear.  I got a very nice decompression.   Three stab wounds were then closed with staples.  Sterile bulky dressing was  applied followed by a sling.   PLAN:  Percocet for pain.  Recovery care center overnight.  Office one week.       PWW/MEDQ  D:  11/10/2003  T:  11/10/2003  Job:  [id number]

## 2010-06-15 NOTE — Discharge Summary (Signed)
NAME:  [name], [name]                        ACCOUNT NO.:  [id number]   MEDICAL RECORD NO.:  [id number]                   PATIENT TYPE:  INP   LOCATION:  3728                                 FACILITY:  [facility]   PHYSICIAN:  Vida Roller, M.D.                DATE OF BIRTH:  Jul 24, 1926   DATE OF ADMISSION:  02/22/2003  DATE OF DISCHARGE:  02/23/2003                                 DISCHARGE SUMMARY   BRIEF HISTORY:  This is a 77 year old male with history of gastroesophageal  reflux disease treated with Nexium who presented to [facility] for  evaluation of chest pain.  The patient has no know history of coronary  artery disease.  He had an exercise Cardiolite performed March 2004 which  was negative for ischemia but with poor exercise tolerance.  There was a  persistent inferior basal defect but no clear evidence of ischemia.  Ejection fraction was 60%.  Wall motion was normal.  The patient was treated  with nitroglycerin and morphine at [facility] and eventually transferred to  [facility] for further evaluation.   PAST MEDICAL HISTORY:  Gastroesophageal reflux disease.   ALLERGIES:  No known drug allergies.   MEDICATIONS:  Nexium 40 mg daily.   FAMILY HISTORY:  The patient's mother had an MI at age 80.   SOCIAL HISTORY:  The patient quit smoking in 1995.  He denies any  significant history of alcohol use.   HOSPITAL COURSE:  As noted, this patient was transferred to [facility] for further evaluation of chest pain.  At [facility], the  patient was noted to have an irregular heart rate and was found to be in  atrial fibrillation.   The patient underwent cardiac catheterization on the day of admission.  During the catheterization, he had DC cardioversion x2, both of which were  unsuccessful.  He was found to have nonobstructive coronary disease with the  ejection fraction of 75%.  He was treated with IV Cardizem for his atrial   fibrillation.   The patient spontaneously converted to normal sinus rhythm on January 26.  Decision was made to proceed with discharge on Coumadin therapy.   LABORATORY DATA:  Chest x-ray on January 25, showed atelectasis versus  infiltrate at the left lung base.  A CT scan of the chest on January 26 was  negative for acute pulmonary embolus.  It did show small bilateral pleural  effusions with an adjacent infiltrate or consolidation in the posterior  lobes, left greater than right.  There was also mild mediastinal adenopathy,  nonspecific, possibly reactive.  He was also noted to have a hiatal hernia.   A CBC was within normal limits with hemoglobin 15.3, hematocrit 44.8, WBC  9000, platelets 162,000.  Chemistry profile on January 26, revealed BUN 11,  creatinine 1.1, potassium 4.1, glucose 108, cardiac enzymes negative.  Lipid  profile revealed cholesterol 184, triglycerides  114, HDL 44, LDL 117, TSH  2.753, T4 7.1.   DISCHARGE MEDICATIONS:  1. Nexium as previously taking.  2. Coumadin 5 mg daily.  3. Metoprolol 50 mg 1/4 tablet twice daily.  4. Coated aspirin 325 mg daily.  5. Tylenol as needed.   DISCHARGE INSTRUCTIONS:  He was told to avoid any strenuous activity or  driving for two days.  He was told not to lift more than 10 pounds for one  week.  He was to be on a low-salt, low-fat diet.  He was told to call the  office if he had any increased pain, swelling or bleeding from his groin.  He was to be seen in the Coumadin Clinic Friday, February 25, 2003, at the  [facility] in [place].  He was to have a follow up appointment there February  15, at 1:45.   PROBLEMS:  1. Chest pain, myocardial infarction ruled out.  2. Cardiac catheterization February 22, 2003, with nonobstructive coronary     disease, ejection fraction 70%.  3. Atrial fibrillation with attempted DC cardioversion during heart     catheterization unsuccessful, but later spontaneous conversion to normal     sinus  rhythm.  4. Coumadin therapy initiated this admission.  5. Gastroesophageal reflux disease.  6. History of elevated lipids with normal lipid profile this admission.  7. Abnormal chest CT, please see as noted above.      Delton See, P.A. [facility]                  Vida Roller, M.D.    DR/MEDQ  D:  03/15/2003  T:  03/15/2003  Job:  [id number]   cc:   [facility]  [place], [place] Theresa Mulligan, M.D.  [place], [place]

## 2010-06-15 NOTE — Op Note (Signed)
[name], [name]              ACCOUNT NO.:  [id number]   MEDICAL RECORD NO.:  [id number]          PATIENT TYPE:  AMB   LOCATION:  DSC                          FACILITY:  [facility]   PHYSICIAN:  Claude Manges. Whitfield, M.D.DATE OF BIRTH:  1927-01-10   DATE OF PROCEDURE:  03/30/2004  DATE OF DISCHARGE:                                 OPERATIVE REPORT   PREOPERATIVE DIAGNOSES:  1.  Biceps tendon tear right shoulder.  2.  Residual arthrosis acromioclavicular joint.  3.  Possible rotator cuff tear.   POSTOPERATIVE DIAGNOSES:  1.  Biceps tendon tear.  2.  Residual arthrosis acromioclavicular joint.  3.  Partial rotator cuff tear.   PROCEDURE:  1.  Arthroscopic evaluation right shoulder.  2.  Open biceps tenodesis.  3.  Open distal clavicle resection.   SURGEON:  Claude Manges. Cleophas Dunker, M.D.   ASSISTANT:  Jamelle Rushing, P.A.C.   ANESTHESIA:  General with interscalene nerve block supplement.   COMPLICATIONS:  None.   HISTORY:  A 77 year old gentleman is nearly five months status post an  arthroscopic subacromial decompression distal clavicle resection of his  right shoulder.  He also had debridement of partial rotator cuff tear.  He  has done relatively well.  Much of the pain that he had preoperatively had  resolved but he was still having some discomfort in the area of the [facility] joint  associated with pain to the point that he is still having some compromise  particularly with playing golf - passion.  He has had local cortisone  injections at the [facility] joint with about 75% relief of his pain but he has had  some referred pain along the biceps muscle with a positive speed sign.  He  had previous MRI scan prior to the arthroscopy in October with evidence of a  biceps tendon tear.  This was debrided at the time of surgery but he may  have further tearing as a cause of his pain.  He is now to have an  arthroscopic evaluation, distal clavicle resection and possible biceps  tenodesis.   DESCRIPTION OF PROCEDURE:  With the patient comfortable on the operating  table and under general orotracheal anesthesia, with a supplement  interscalene nerve block in the right side of the neck, the patient was  placed in semisitting position.  The right shoulder was then prepped with  DuraPrep from the base of the neck circumferentially below the elbow.  Sterile draping was performed.   A marking pen was used to outline the acromion and the [facility] joint and the  coracoid.  At a point a fingerbreadth posterior and medial to the posterior  angle of acromion a small stab wound was made.  The arthroscope was easily  placed into the shoulder joint.  Diagnostic arthroscopy revealed very mild  chondromalacia of the humeral head and glenoid.  No evidence of a slap  lesion or labral tear.  There was considerable tearing of the biceps tendon  which appeared to progress since his arthroscopy in October.  There were no  loose bodies.  He also had a partial rotator cuff  tear on the joint surface  but no evidence of a through and through tear.   The arthroscope was then placed to subacromial space posteriorly.  There was  no evidence of a rotator cuff tear.  He had a very nice decompression and  without a significant bursal reaccumulation.   At that point, the arthroscope was removed and an open distal clavicle  resection was performed about a half to three quarter inch incision was made  directly over the distal clavicle for a sharp dissection.  Incision was  carried down to the subcutaneous tissue.  Small bleeders were Bovie  coagulated.  The [facility] joint capsule was incised.  Subperiosteal dissection was  performed along the distal clavicle.  There appeared to be an excellent  decompression from the previous arthroscopy but there was considerable scar  and cicatrix between the remaining distal clavicle and acromion.  This was  sharply debrided.  Oscillating saw was used to remove further distal   clavicle so there was a very nice decompression.  There was no evidence of  bleeding.  The wound was irrigated with saline solution.  The caps were  closed dorsally with 2-0 Vicryl, the subcu with 2-0 Vicryl and the skin  closed with skin clips.   An anterior incision was made over the shoulder at the level of the biceps  tendon approximately an inch and a half for a sharp dissection carried down  through subcutaneous tissue.  Gross bleeders were Bovie coagulated.  Deltoid  fascia was incised such that I could elevate the deltoid muscle fibers off  the anterior aspect of the shoulder.  The rotator cuff was identified.  There was no evidence of a cuff tear.  I had excellent previous  decompression along the acromion and there was no residual impingement from  the distal clavicle.   The biceps screws were identified.  Longitudinal incision was made less than  an inch in length.  Biceps tendon was retrieved.  Tag suture was inserted  and it was incised from its attachment to the superior labrum.  There was  considerable fraying and partial tearing and some synovitis which was  debrided.   The biceps groove was debrided of soft tissue with good bleeding bone.  A  single Mitek anchor was inserted and then there was a tenodesis performed in  the groove.  I further secured the tendon to the groove with 0 Ethibond  suture.  The wound was irrigated with saline solution.  The small incision  into the rotator cuff was closed with interrupted Ethibond suture.  The  wound was again irrigated.  The deltoid fascia closed with a running 0  Vicryl, the subcu with 2-0 Vicryl.  Skin closed with skin clips.  Sterile  bulky dressing was applied followed by a sling.   PLAN:  Discharge as outpatient.  Percocet for pain.  Office one week.      PWW/MEDQ  D:  03/30/2004  T:  03/30/2004  Job:  [id number]

## 2010-07-11 ENCOUNTER — Encounter: Payer: Self-pay | Admitting: Cardiology

## 2010-07-19 NOTE — Discharge Summary (Signed)
  [name], GALINDO NO.:  [id number]  MEDICAL RECORD NO.:  [id number]  LOCATION:  A310                          FACILITY:  [facility]  PHYSICIAN:  Tilford Pillar, MD      DATE OF BIRTH:  [birth year]  DATE OF ADMISSION:  04/09/2010 DATE OF DISCHARGE:  03/13/2012LH                              DISCHARGE SUMMARY   ADMISSION DIAGNOSES:  Status post laparoscopic cholecystectomy.  DISCHARGE DIAGNOSES:  Status post laparoscopic cholecystectomy for cholelithiasis, atherosclerotic vascular disease, history of abdominal aortic aneurysm, renal calculi, gastroesophageal reflux disease and hypertension.  DISPOSITION:  Home.  BRIEF HISTORY AND PHYSICAL:  Please see admission history and physical for complete H and P.  The patient is an 77 year old male who had presented for planned laparoscopic possible open cholecystectomy.  He was admitted for continued postoperative pain control monitoring.  HOSPITAL COURSE:  The patient underwent the above-mentioned laparoscopic cholecystectomy.  He tolerated this extremely well.  He has spent a brief period in the postanesthetic care unit.  Following the procedure, he was transferred to the floor and was watched overnight.  He was advanced to a regular diet.  He tolerated this extremely well.  His pain was controlled on oral analgesia.  He was ambulatory and patient was comfortable on the following morning.  Plans were made for discharge to home.  DISCHARGE INSTRUCTIONS:  He is instructed to increase activity.  He may shower.  He is not to soak the incisions for the next 3 weeks.  He is instructed on wound care.  He is instructed to continue a regular diet. He is to return to see me in the office in 3 weeks.     Tilford Pillar, MD     BZ/MEDQ  D:  07/18/2010  T:  07/19/2010  Job:  [id number]  Electronically Signed by Tilford Pillar MD on 07/19/2010 12:45:52 PM

## 2010-07-19 NOTE — Op Note (Signed)
NAMEROBYN, NOHR NO.:  [id number]  MEDICAL RECORD NO.:  [id number]  LOCATION:                                 FACILITY:  PHYSICIAN:  Tilford Pillar, MD      DATE OF BIRTH:  03-Jan-1927  DATE OF PROCEDURE:  04/09/2010 DATE OF DISCHARGE:  04/10/2010                              OPERATIVE REPORT   PREOPERATIVE DIAGNOSES:  Cholelithiasis.  POSTOPERATIVE DIAGNOSIS:  Cholelithiasis.  PROCEDURE:  Laparoscopic cholecystectomy.  SURGEON:  Dr. Tilford Pillar.  ANESTHESIA:  General anesthetic, local anesthetic 0.5% Sensorcaine plain.  SPECIMEN:  Gallbladder.  ESTIMATED BLOOD LOSS:  Minimal.  INDICATIONS:  The patient is an 77 year old male who had presented to my office with a history of right upper quadrant abdominal pain.  The workup was consistent for cholelithiasis.  The risks, benefits, alternatives of laparoscopic possible open cholecystectomy were discussed at length with the patient.  His questions and concerns were addressed.  The patient was consented for planned procedure.  OPERATION:  The patient was taken to the operating room.  He was placed in supine position on the operating table, at which time, general endotracheal anesthesia was administered.  Once the patient asleep, he was endotracheally intubated by Anesthesia.  At this point, his abdomen was prepped with DuraPrep solution and draped in a standard fashion.  A stab incision created supraumbilically with 11 blade scalpel additional dissection down through subcu tissues was carried out using electrocautery.  A Kocher clamp was utilized to grasp the anterior abdominal fascia with this anteriorly a Veress needle was inserted. Saline drop test was utilized to confirm intraperitoneal placement and then pneumoperitoneum was initiated.  Once sufficient pneumoperitoneum was obtained, 11-mm trocar inserted over laparoscope allowing visualization of the trocar entering the peritoneal cavity.  At  this time the inner cannula was removed.  The laparoscope was reinserted. There was no evidence of any trocar or Veress needle placement injury. At this time, the remaining trocars were placed with 11 mm trocar in the epigastrium, 5 mm trocar in the midline between the two 11-mm trocars and 5-mm trocar in the right lateral abdominal wall.  The patient was placed to a reverse Trendelenburg left lateral decubitus position.  The fundus of the gallbladder was grasped with a regular grasper and lifted up and over the right lobe of the liver.  At this time, a [place] was utilized bluntly stripped and the peritoneal reflection off the infundibulum exposing the cystic duct as it enters into the infundibulum.  A window was created behind the cystic duct.  Three clips was placed proximally, one distally and cystic duct was divided between to two most distal clips, similarly the cystic arteries were identified. Two Endo Clips were placed proximally, one distally and the cystic arteries divided between two most distal clips.  At this time, the gallbladder is dissected free from the gallbladder fossa using electrocautery.  Once it is free was placed into EndoCatch bag placed up and over the right lobe of liver.  At this time inspection of the gallbladder fossa demonstrated excellent hemostasis.  Endo Clips were inspected and there is no bleeding or bile leak.  At this time, attention  was turned to closure.  Using a Endo close suture passer device a 2-0 Vicryl sutures passed through both the 11 trocar sites.  With these sutures in place, the gallbladder was retrieved and it was removed through the umbilical trocar site intact EndoCatch bag.  At this time the pneumoperitoneum was evacuated.  The trocars were removed.  The Vicryl sutures were secured with local anesthetic was instilled.  A 4-0 Monocryl was utilized to reapproximate skin edges at all four trocar sites.  The skin was  washed and dried moist dry towel.  Benzoin was applied around incision.  Half inch Steri-Strips were placed.  Drapes removed.  The patient's allowed come out general anesthetic and the patient was transferred to regular hospital bed in stable condition.  At the conclusion of procedure, all instrument, sponge, and needle counts are correct.  The patient tolerated the procedure extremely well.     Tilford Pillar, MD     BZ/MEDQ  D:  07/18/2010  T:  07/19/2010  Job:  [id number]  Electronically Signed by Tilford Pillar MD on 07/19/2010 12:45:54 PM

## 2010-07-26 ENCOUNTER — Other Ambulatory Visit: Payer: Self-pay | Admitting: Orthopedic Surgery

## 2010-07-26 DIAGNOSIS — T148XXA Other injury of unspecified body region, initial encounter: Secondary | ICD-10-CM

## 2010-07-26 DIAGNOSIS — S52599A Other fractures of lower end of unspecified radius, initial encounter for closed fracture: Secondary | ICD-10-CM

## 2010-07-26 DIAGNOSIS — R52 Pain, unspecified: Secondary | ICD-10-CM

## 2010-07-26 DIAGNOSIS — M79646 Pain in unspecified finger(s): Secondary | ICD-10-CM

## 2010-07-26 DIAGNOSIS — S6390XA Sprain of unspecified part of unspecified wrist and hand, initial encounter: Secondary | ICD-10-CM

## 2010-07-26 DIAGNOSIS — Z89019 Acquired absence of unspecified thumb: Secondary | ICD-10-CM

## 2010-07-26 DIAGNOSIS — M259 Joint disorder, unspecified: Secondary | ICD-10-CM

## 2010-07-26 DIAGNOSIS — S61209A Unspecified open wound of unspecified finger without damage to nail, initial encounter: Secondary | ICD-10-CM

## 2010-07-27 ENCOUNTER — Ambulatory Visit
Admission: RE | Admit: 2010-07-27 | Discharge: 2010-07-27 | Disposition: A | Discharge status: Home / Self Care | Payer: Medicare Other | Source: Ambulatory Visit | Attending: Orthopedic Surgery | Admitting: Orthopedic Surgery

## 2010-07-27 DIAGNOSIS — S52599A Other fractures of lower end of unspecified radius, initial encounter for closed fracture: Secondary | ICD-10-CM

## 2010-07-27 DIAGNOSIS — M259 Joint disorder, unspecified: Secondary | ICD-10-CM

## 2010-08-03 ENCOUNTER — Other Ambulatory Visit: Payer: Medicare Other

## 2010-08-09 ENCOUNTER — Encounter: Payer: Self-pay | Admitting: Cardiology

## 2010-08-16 ENCOUNTER — Ambulatory Visit (INDEPENDENT_AMBULATORY_CARE_PROVIDER_SITE_OTHER): Payer: Medicare Other | Admitting: Cardiology

## 2010-08-16 ENCOUNTER — Encounter: Payer: Self-pay | Admitting: Cardiology

## 2010-08-16 VITALS — BP 133/79 | HR 63 | Ht 68.0 in | Wt 190.0 lb

## 2010-08-16 DIAGNOSIS — Z01818 Encounter for other preprocedural examination: Secondary | ICD-10-CM

## 2010-08-16 DIAGNOSIS — I4891 Unspecified atrial fibrillation: Secondary | ICD-10-CM

## 2010-08-16 DIAGNOSIS — I714 Abdominal aortic aneurysm, without rupture: Secondary | ICD-10-CM

## 2010-08-16 MED ORDER — PINDOLOL 5 MG PO TABS: ORAL_TABLET | ORAL | Status: DC

## 2010-08-16 NOTE — Assessment & Plan Note (Signed)
No recurrent atrial fibrillation. No palpitations. I did prescribe the patient a low dose of pindolol 2.5 mg by mouth daily. This was prescribed previously, but the patient does not recall if he ever filled the prescription already had side effects of this medication. He will check with his pharmacy.

## 2010-08-16 NOTE — Assessment & Plan Note (Signed)
Followed by his primary care physician and recently evaluated. Stable at 3.5 cm

## 2010-08-16 NOTE — Assessment & Plan Note (Signed)
I understand he scheduled for repair of right thumb FPL rupture and right wrist plate removal. There are no cardiovascular contraindications to proceed with surgery either under local or general anesthesia.

## 2010-08-16 NOTE — Progress Notes (Signed)
HPI [name] is an 77 year old patient of mine with no prior history of significant coronary artery disease. Previously he had a myocardial perfusion imaging scan which showed normal LV perfusion and ejection fraction of 69% with 2 small fixed defect but no ischemia. The patient reports no chest pain. He does have a history of paroxysmal atrial fibrillation but has remained in normal sinus rhythm. He reports no recurrent palpitations in his EKG in the office today is within normal limits. He does have a history of prior deep venous thrombosis and pulmonary embolism in 2008 in the setting of an ankle injury when orthosis was applied. He was treated for 6 months with warfarin and has had no recurrence.  The patient has some chronic lower extremity edema which is largely dependent but no evidence or history of heart failure. He does have vascular disease with a small aortic aneurysm at 3.5 cm which was recently assessed by his primary care physician. From a cardiovascular standpoint the patient is stable.  I understand he scheduled for repair of right thumb FPL rupture and right wrist plate removal. There are no cardiovascular contraindications to proceed with surgery either under local or general anesthesia.  Allergies  Allergen Reactions  . Flexeril (Cyclobenzaprine Hcl)   . Percocet (Oxycodone-Acetaminophen)   . Rosuvastatin     REACTION: N/V  . Sulfonamide Derivatives     REACTION: RASH  . Vicodin (Hydrocodone-Acetaminophen)     Current Outpatient Prescriptions on File Prior to Visit  Medication Sig Dispense Refill  . aspirin 81 MG EC tablet Take 81 mg by mouth daily.        . Cholecalciferol (VITAMIN D) 400 UNITS capsule Take 800 Units by mouth daily.        . dorzolamide (TRUSOPT) 2 % ophthalmic solution Place 1 drop into the right eye daily.        Marland Kitchen esomeprazole (NEXIUM) 40 MG capsule Take 40 mg by mouth daily.        . finasteride (PROSCAR) 5 MG tablet Take 5 mg by mouth  daily.        Marland Kitchen latanoprost (XALATAN) 0.005 % ophthalmic solution Place 1 drop into the right eye 2 (two) times daily.        . Multiple Vitamin (MULTIVITAMIN) tablet Take 1 tablet by mouth daily.        . nitroGLYCERIN (NITROSTAT) 0.4 MG SL tablet Place 0.4 mg under the tongue every 5 (five) minutes as needed.        . Omega-3 Fatty Acids (FISH OIL) 1000 MG CAPS Take 2 capsules by mouth 2 (two) times daily.          Past Medical History  Diagnosis Date  . Orthostatic hypotension   . AAA (abdominal aortic aneurysm)   . Atrial fibrillation     bicarbonate perfusion study 10/11 EF 69%. small defects no ischemia. 7 metastases acheived. ER visit 11/10/09 ruled out MI normal sinus rhythm orthostasis after sublingual nitroglycerin     No past surgical history on file.  No family history on file.  History   Social History  . Marital Status: Divorced    Spouse Name: N/A    Number of Children: N/A  . Years of Education: N/A   Occupational History  . Not on file.   Social History Main Topics  . Smoking status: Former Smoker -- 1.0 packs/day for 10 years    Types: Cigarettes    Quit date: 01/28/1954  . Smokeless tobacco: Never Used  Comment: tobacco use - no  . Alcohol Use: No  . Drug Use: Not on file  . Sexually Active: Not on file   Other Topics Concern  . Not on file   Social History Narrative  . No narrative on file    WRU:[id number] positives as outlined above. The remainder of the 18  point review of systems is negative  PHYSICAL EXAM BP 133/79  Pulse 63  Ht 5\' 8"  (1.727 m)  Wt 190 lb (86.183 kg)  BMI 28.89 kg/m2  General: Well-developed, well-nourished in no distress Head: Normocephalic and atraumatic Eyes:PERRLA/EOMI intact, conjunctiva and lids normal Ears: No deformity or lesions Mouth:normal dentition, normal posterior pharynx Neck: Supple, no JVD.  No masses, thyromegaly or abnormal cervical nodes Lungs: Normal breath sounds bilaterally without wheezing.   Normal percussion Cardiac: regular rate and rhythm with normal S1 and S2, no S3 or S4.  PMI is normal.  No pathological murmurs Abdomen: Normal bowel sounds, abdomen is soft and nontender without masses, organomegaly or hernias noted.  No hepatosplenomegaly MSK: Back normal, normal gait muscle strength and tone normal Vascular: Pulse is normal in all 4 extremities Extremities: No peripheral pitting edema Neurologic: Alert and oriented x 3 Skin: Intact without lesions or rashes Lymphatics: No significant adenopathy Psychologic: Normal affect  ECG: Sinus bradycardia no acute changes.  ASSESSMENT AND PLAN

## 2010-08-16 NOTE — Patient Instructions (Addendum)
   Begin Pindolol 5mg  - take 1/2 tab (2.5mg ) daily  Your physician wants you to follow up in: 6 months.  You will receive a reminder letter in the mail one-two months in advance.  If you don't receive a letter, please call our office to schedule the follow up appointment

## 2010-10-18 ENCOUNTER — Ambulatory Visit (HOSPITAL_COMMUNITY)
Admission: RE | Admit: 2010-10-18 | Discharge: 2010-10-18 | Disposition: A | Discharge status: Home / Self Care | Payer: Medicare Other | Source: Ambulatory Visit | Attending: Urology | Admitting: Urology

## 2010-10-18 ENCOUNTER — Other Ambulatory Visit (HOSPITAL_COMMUNITY): Payer: Self-pay | Admitting: Urology

## 2010-10-18 DIAGNOSIS — N200 Calculus of kidney: Secondary | ICD-10-CM

## 2010-10-18 DIAGNOSIS — R1032 Left lower quadrant pain: Secondary | ICD-10-CM | POA: Insufficient documentation

## 2010-11-15 ENCOUNTER — Ambulatory Visit (HOSPITAL_COMMUNITY)
Admission: RE | Admit: 2010-11-15 | Discharge: 2010-11-15 | Disposition: A | Discharge status: Home / Self Care | Payer: Medicare Other | Source: Ambulatory Visit | Attending: Urology | Admitting: Urology

## 2010-11-15 ENCOUNTER — Other Ambulatory Visit (HOSPITAL_COMMUNITY): Payer: Self-pay | Admitting: Urology

## 2010-11-15 DIAGNOSIS — N200 Calculus of kidney: Secondary | ICD-10-CM

## 2010-11-15 DIAGNOSIS — R109 Unspecified abdominal pain: Secondary | ICD-10-CM | POA: Insufficient documentation

## 2011-02-14 DIAGNOSIS — R1013 Epigastric pain: Secondary | ICD-10-CM | POA: Diagnosis not present

## 2011-02-14 DIAGNOSIS — K219 Gastro-esophageal reflux disease without esophagitis: Secondary | ICD-10-CM | POA: Diagnosis not present

## 2011-02-14 DIAGNOSIS — K432 Incisional hernia without obstruction or gangrene: Secondary | ICD-10-CM | POA: Diagnosis not present

## 2011-02-14 DIAGNOSIS — R079 Chest pain, unspecified: Secondary | ICD-10-CM | POA: Diagnosis not present

## 2011-02-14 DIAGNOSIS — I729 Aneurysm of unspecified site: Secondary | ICD-10-CM | POA: Diagnosis not present

## 2011-02-28 DIAGNOSIS — E291 Testicular hypofunction: Secondary | ICD-10-CM | POA: Diagnosis not present

## 2011-03-08 DIAGNOSIS — H4011X0 Primary open-angle glaucoma, stage unspecified: Secondary | ICD-10-CM | POA: Diagnosis not present

## 2011-03-29 DIAGNOSIS — E291 Testicular hypofunction: Secondary | ICD-10-CM | POA: Diagnosis not present

## 2011-04-10 DIAGNOSIS — E538 Deficiency of other specified B group vitamins: Secondary | ICD-10-CM | POA: Diagnosis not present

## 2011-04-10 DIAGNOSIS — E291 Testicular hypofunction: Secondary | ICD-10-CM | POA: Diagnosis not present

## 2011-04-10 DIAGNOSIS — J019 Acute sinusitis, unspecified: Secondary | ICD-10-CM | POA: Diagnosis not present

## 2011-04-10 DIAGNOSIS — E559 Vitamin D deficiency, unspecified: Secondary | ICD-10-CM | POA: Diagnosis not present

## 2011-04-10 DIAGNOSIS — E782 Mixed hyperlipidemia: Secondary | ICD-10-CM | POA: Diagnosis not present

## 2011-04-10 DIAGNOSIS — R079 Chest pain, unspecified: Secondary | ICD-10-CM | POA: Diagnosis not present

## 2011-04-10 DIAGNOSIS — I729 Aneurysm of unspecified site: Secondary | ICD-10-CM | POA: Diagnosis not present

## 2011-04-10 DIAGNOSIS — I10 Essential (primary) hypertension: Secondary | ICD-10-CM | POA: Diagnosis not present

## 2011-04-25 DIAGNOSIS — L570 Actinic keratosis: Secondary | ICD-10-CM | POA: Diagnosis not present

## 2011-05-02 DIAGNOSIS — I4891 Unspecified atrial fibrillation: Secondary | ICD-10-CM | POA: Diagnosis not present

## 2011-06-03 DIAGNOSIS — E291 Testicular hypofunction: Secondary | ICD-10-CM | POA: Diagnosis not present

## 2011-07-05 DIAGNOSIS — E291 Testicular hypofunction: Secondary | ICD-10-CM | POA: Diagnosis not present

## 2011-07-30 DIAGNOSIS — L570 Actinic keratosis: Secondary | ICD-10-CM | POA: Diagnosis not present

## 2011-08-05 DIAGNOSIS — E291 Testicular hypofunction: Secondary | ICD-10-CM | POA: Diagnosis not present

## 2011-08-08 DIAGNOSIS — H409 Unspecified glaucoma: Secondary | ICD-10-CM | POA: Diagnosis not present

## 2011-08-08 DIAGNOSIS — H4011X0 Primary open-angle glaucoma, stage unspecified: Secondary | ICD-10-CM | POA: Diagnosis not present

## 2011-08-08 DIAGNOSIS — H2510 Age-related nuclear cataract, unspecified eye: Secondary | ICD-10-CM | POA: Diagnosis not present

## 2011-09-05 DIAGNOSIS — E291 Testicular hypofunction: Secondary | ICD-10-CM | POA: Diagnosis not present

## 2011-09-27 ENCOUNTER — Other Ambulatory Visit: Payer: Self-pay | Admitting: Cardiology

## 2011-10-09 DIAGNOSIS — N400 Enlarged prostate without lower urinary tract symptoms: Secondary | ICD-10-CM | POA: Diagnosis not present

## 2011-10-09 DIAGNOSIS — I729 Aneurysm of unspecified site: Secondary | ICD-10-CM | POA: Diagnosis not present

## 2011-10-09 DIAGNOSIS — I10 Essential (primary) hypertension: Secondary | ICD-10-CM | POA: Diagnosis not present

## 2011-10-09 DIAGNOSIS — K219 Gastro-esophageal reflux disease without esophagitis: Secondary | ICD-10-CM | POA: Diagnosis not present

## 2011-10-09 DIAGNOSIS — E782 Mixed hyperlipidemia: Secondary | ICD-10-CM | POA: Diagnosis not present

## 2011-10-09 DIAGNOSIS — I4891 Unspecified atrial fibrillation: Secondary | ICD-10-CM | POA: Diagnosis not present

## 2011-10-09 DIAGNOSIS — E291 Testicular hypofunction: Secondary | ICD-10-CM | POA: Diagnosis not present

## 2011-10-09 DIAGNOSIS — K432 Incisional hernia without obstruction or gangrene: Secondary | ICD-10-CM | POA: Diagnosis not present

## 2011-10-11 ENCOUNTER — Encounter: Payer: Self-pay | Admitting: Cardiovascular Disease

## 2011-10-11 DIAGNOSIS — I714 Abdominal aortic aneurysm, without rupture: Secondary | ICD-10-CM | POA: Diagnosis not present

## 2011-10-11 DIAGNOSIS — I4891 Unspecified atrial fibrillation: Secondary | ICD-10-CM | POA: Diagnosis not present

## 2011-10-11 DIAGNOSIS — I2789 Other specified pulmonary heart diseases: Secondary | ICD-10-CM | POA: Diagnosis not present

## 2011-10-11 DIAGNOSIS — I059 Rheumatic mitral valve disease, unspecified: Secondary | ICD-10-CM | POA: Diagnosis not present

## 2011-10-11 DIAGNOSIS — R5381 Other malaise: Secondary | ICD-10-CM | POA: Diagnosis not present

## 2011-10-16 DIAGNOSIS — E291 Testicular hypofunction: Secondary | ICD-10-CM | POA: Diagnosis not present

## 2011-11-03 DIAGNOSIS — Z7982 Long term (current) use of aspirin: Secondary | ICD-10-CM | POA: Diagnosis not present

## 2011-11-03 DIAGNOSIS — Z79899 Other long term (current) drug therapy: Secondary | ICD-10-CM | POA: Diagnosis not present

## 2011-11-03 DIAGNOSIS — T6391XA Toxic effect of contact with unspecified venomous animal, accidental (unintentional), initial encounter: Secondary | ICD-10-CM | POA: Diagnosis not present

## 2011-11-11 DIAGNOSIS — L570 Actinic keratosis: Secondary | ICD-10-CM | POA: Diagnosis not present

## 2011-11-15 ENCOUNTER — Ambulatory Visit: Payer: Medicare Other | Admitting: Cardiology

## 2011-11-16 DIAGNOSIS — Z23 Encounter for immunization: Secondary | ICD-10-CM | POA: Diagnosis not present

## 2011-11-18 DIAGNOSIS — E291 Testicular hypofunction: Secondary | ICD-10-CM | POA: Diagnosis not present

## 2011-12-09 DIAGNOSIS — M722 Plantar fascial fibromatosis: Secondary | ICD-10-CM | POA: Diagnosis not present

## 2011-12-09 DIAGNOSIS — M79609 Pain in unspecified limb: Secondary | ICD-10-CM | POA: Diagnosis not present

## 2011-12-16 DIAGNOSIS — K219 Gastro-esophageal reflux disease without esophagitis: Secondary | ICD-10-CM | POA: Diagnosis not present

## 2011-12-16 DIAGNOSIS — J310 Chronic rhinitis: Secondary | ICD-10-CM | POA: Diagnosis not present

## 2011-12-19 ENCOUNTER — Ambulatory Visit: Payer: Medicare Other | Admitting: Cardiology

## 2011-12-20 ENCOUNTER — Ambulatory Visit (HOSPITAL_COMMUNITY)
Admission: RE | Admit: 2011-12-20 | Discharge: 2011-12-20 | Disposition: A | Discharge status: Home / Self Care | Payer: Medicare Other | Source: Ambulatory Visit | Attending: Urology | Admitting: Urology

## 2011-12-20 ENCOUNTER — Other Ambulatory Visit: Payer: Self-pay | Admitting: Urology

## 2011-12-20 ENCOUNTER — Ambulatory Visit (INDEPENDENT_AMBULATORY_CARE_PROVIDER_SITE_OTHER): Payer: Medicare Other | Admitting: Urology

## 2011-12-20 DIAGNOSIS — R972 Elevated prostate specific antigen [PSA]: Secondary | ICD-10-CM

## 2011-12-20 DIAGNOSIS — N401 Enlarged prostate with lower urinary tract symptoms: Secondary | ICD-10-CM | POA: Diagnosis not present

## 2011-12-20 DIAGNOSIS — K5900 Constipation, unspecified: Secondary | ICD-10-CM | POA: Diagnosis not present

## 2011-12-20 DIAGNOSIS — N400 Enlarged prostate without lower urinary tract symptoms: Secondary | ICD-10-CM | POA: Diagnosis not present

## 2011-12-20 DIAGNOSIS — N200 Calculus of kidney: Secondary | ICD-10-CM

## 2011-12-30 DIAGNOSIS — M79609 Pain in unspecified limb: Secondary | ICD-10-CM | POA: Diagnosis not present

## 2011-12-30 DIAGNOSIS — M722 Plantar fascial fibromatosis: Secondary | ICD-10-CM | POA: Diagnosis not present

## 2012-01-02 ENCOUNTER — Other Ambulatory Visit: Payer: Self-pay | Admitting: Cardiology

## 2012-01-02 MED ORDER — PINDOLOL 5 MG PO TABS: 2.5000 mg | ORAL_TABLET | Freq: Every day | ORAL | Status: DC

## 2012-01-27 DIAGNOSIS — R609 Edema, unspecified: Secondary | ICD-10-CM | POA: Diagnosis not present

## 2012-01-27 DIAGNOSIS — I2510 Atherosclerotic heart disease of native coronary artery without angina pectoris: Secondary | ICD-10-CM | POA: Diagnosis not present

## 2012-01-27 DIAGNOSIS — R002 Palpitations: Secondary | ICD-10-CM | POA: Diagnosis not present

## 2012-01-27 DIAGNOSIS — I4891 Unspecified atrial fibrillation: Secondary | ICD-10-CM | POA: Diagnosis not present

## 2012-02-04 DIAGNOSIS — H4011X0 Primary open-angle glaucoma, stage unspecified: Secondary | ICD-10-CM | POA: Diagnosis not present

## 2012-02-04 DIAGNOSIS — H409 Unspecified glaucoma: Secondary | ICD-10-CM | POA: Diagnosis not present

## 2012-02-05 DIAGNOSIS — M722 Plantar fascial fibromatosis: Secondary | ICD-10-CM | POA: Diagnosis not present

## 2012-02-17 DIAGNOSIS — R972 Elevated prostate specific antigen [PSA]: Secondary | ICD-10-CM | POA: Diagnosis not present

## 2012-02-18 DIAGNOSIS — I4891 Unspecified atrial fibrillation: Secondary | ICD-10-CM | POA: Diagnosis not present

## 2012-02-21 DIAGNOSIS — M722 Plantar fascial fibromatosis: Secondary | ICD-10-CM | POA: Diagnosis not present

## 2012-04-09 ENCOUNTER — Encounter: Payer: Self-pay | Admitting: Family Medicine

## 2012-04-09 DIAGNOSIS — E782 Mixed hyperlipidemia: Secondary | ICD-10-CM | POA: Diagnosis not present

## 2012-04-09 DIAGNOSIS — E559 Vitamin D deficiency, unspecified: Secondary | ICD-10-CM | POA: Diagnosis not present

## 2012-04-09 DIAGNOSIS — E291 Testicular hypofunction: Secondary | ICD-10-CM | POA: Diagnosis not present

## 2012-04-09 DIAGNOSIS — I10 Essential (primary) hypertension: Secondary | ICD-10-CM | POA: Diagnosis not present

## 2012-05-11 DIAGNOSIS — L570 Actinic keratosis: Secondary | ICD-10-CM | POA: Diagnosis not present

## 2012-05-11 DIAGNOSIS — C44221 Squamous cell carcinoma of skin of unspecified ear and external auricular canal: Secondary | ICD-10-CM | POA: Diagnosis not present

## 2012-05-11 DIAGNOSIS — D0460 Carcinoma in situ of skin of unspecified upper limb, including shoulder: Secondary | ICD-10-CM | POA: Diagnosis not present

## 2012-06-17 ENCOUNTER — Other Ambulatory Visit: Payer: Self-pay | Admitting: *Deleted

## 2012-06-17 DIAGNOSIS — I714 Abdominal aortic aneurysm, without rupture: Secondary | ICD-10-CM

## 2012-06-26 ENCOUNTER — Ambulatory Visit (INDEPENDENT_AMBULATORY_CARE_PROVIDER_SITE_OTHER): Payer: Medicare Other | Admitting: Urology

## 2012-06-26 DIAGNOSIS — N401 Enlarged prostate with lower urinary tract symptoms: Secondary | ICD-10-CM

## 2012-06-26 DIAGNOSIS — R972 Elevated prostate specific antigen [PSA]: Secondary | ICD-10-CM | POA: Diagnosis not present

## 2012-07-03 ENCOUNTER — Ambulatory Visit (INDEPENDENT_AMBULATORY_CARE_PROVIDER_SITE_OTHER): Payer: Medicare Other | Admitting: Urology

## 2012-07-03 DIAGNOSIS — M545 Low back pain, unspecified: Secondary | ICD-10-CM

## 2012-07-03 DIAGNOSIS — Z87442 Personal history of urinary calculi: Secondary | ICD-10-CM | POA: Diagnosis not present

## 2012-07-03 DIAGNOSIS — N401 Enlarged prostate with lower urinary tract symptoms: Secondary | ICD-10-CM | POA: Diagnosis not present

## 2012-07-05 DIAGNOSIS — N401 Enlarged prostate with lower urinary tract symptoms: Secondary | ICD-10-CM | POA: Diagnosis not present

## 2012-07-05 DIAGNOSIS — N200 Calculus of kidney: Secondary | ICD-10-CM | POA: Diagnosis not present

## 2012-07-05 DIAGNOSIS — R509 Fever, unspecified: Secondary | ICD-10-CM | POA: Diagnosis not present

## 2012-07-05 DIAGNOSIS — Z79899 Other long term (current) drug therapy: Secondary | ICD-10-CM | POA: Diagnosis not present

## 2012-07-05 DIAGNOSIS — Z87442 Personal history of urinary calculi: Secondary | ICD-10-CM | POA: Diagnosis not present

## 2012-07-05 DIAGNOSIS — R300 Dysuria: Secondary | ICD-10-CM | POA: Diagnosis not present

## 2012-07-05 DIAGNOSIS — N400 Enlarged prostate without lower urinary tract symptoms: Secondary | ICD-10-CM | POA: Diagnosis not present

## 2012-07-05 DIAGNOSIS — R350 Frequency of micturition: Secondary | ICD-10-CM | POA: Diagnosis not present

## 2012-07-05 DIAGNOSIS — K449 Diaphragmatic hernia without obstruction or gangrene: Secondary | ICD-10-CM | POA: Diagnosis not present

## 2012-07-05 DIAGNOSIS — Z7982 Long term (current) use of aspirin: Secondary | ICD-10-CM | POA: Diagnosis not present

## 2012-07-05 DIAGNOSIS — I714 Abdominal aortic aneurysm, without rupture: Secondary | ICD-10-CM | POA: Diagnosis not present

## 2012-07-05 DIAGNOSIS — N138 Other obstructive and reflux uropathy: Secondary | ICD-10-CM | POA: Diagnosis not present

## 2012-07-05 DIAGNOSIS — E871 Hypo-osmolality and hyponatremia: Secondary | ICD-10-CM | POA: Diagnosis not present

## 2012-07-07 DIAGNOSIS — E871 Hypo-osmolality and hyponatremia: Secondary | ICD-10-CM | POA: Diagnosis not present

## 2012-07-07 DIAGNOSIS — N400 Enlarged prostate without lower urinary tract symptoms: Secondary | ICD-10-CM | POA: Diagnosis not present

## 2012-07-07 DIAGNOSIS — R509 Fever, unspecified: Secondary | ICD-10-CM | POA: Diagnosis not present

## 2012-07-07 DIAGNOSIS — I4891 Unspecified atrial fibrillation: Secondary | ICD-10-CM | POA: Diagnosis not present

## 2012-07-07 DIAGNOSIS — I729 Aneurysm of unspecified site: Secondary | ICD-10-CM | POA: Diagnosis not present

## 2012-07-07 DIAGNOSIS — N410 Acute prostatitis: Secondary | ICD-10-CM | POA: Diagnosis not present

## 2012-07-09 DIAGNOSIS — E871 Hypo-osmolality and hyponatremia: Secondary | ICD-10-CM | POA: Diagnosis not present

## 2012-07-10 ENCOUNTER — Ambulatory Visit (INDEPENDENT_AMBULATORY_CARE_PROVIDER_SITE_OTHER): Payer: Medicare Other | Admitting: Urology

## 2012-07-10 DIAGNOSIS — N401 Enlarged prostate with lower urinary tract symptoms: Secondary | ICD-10-CM

## 2012-07-10 DIAGNOSIS — K5732 Diverticulitis of large intestine without perforation or abscess without bleeding: Secondary | ICD-10-CM

## 2012-07-10 DIAGNOSIS — N200 Calculus of kidney: Secondary | ICD-10-CM | POA: Diagnosis not present

## 2012-07-14 ENCOUNTER — Encounter (INDEPENDENT_AMBULATORY_CARE_PROVIDER_SITE_OTHER): Payer: Self-pay | Admitting: *Deleted

## 2012-07-14 DIAGNOSIS — I4891 Unspecified atrial fibrillation: Secondary | ICD-10-CM | POA: Diagnosis not present

## 2012-07-14 DIAGNOSIS — N400 Enlarged prostate without lower urinary tract symptoms: Secondary | ICD-10-CM | POA: Diagnosis not present

## 2012-07-14 DIAGNOSIS — I729 Aneurysm of unspecified site: Secondary | ICD-10-CM | POA: Diagnosis not present

## 2012-07-14 DIAGNOSIS — N410 Acute prostatitis: Secondary | ICD-10-CM | POA: Diagnosis not present

## 2012-07-14 DIAGNOSIS — R509 Fever, unspecified: Secondary | ICD-10-CM | POA: Diagnosis not present

## 2012-07-14 DIAGNOSIS — E871 Hypo-osmolality and hyponatremia: Secondary | ICD-10-CM | POA: Diagnosis not present

## 2012-07-22 ENCOUNTER — Ambulatory Visit: Payer: Medicare Other | Admitting: Vascular Surgery

## 2012-07-28 ENCOUNTER — Encounter: Payer: Self-pay | Admitting: Vascular Surgery

## 2012-07-29 ENCOUNTER — Ambulatory Visit (INDEPENDENT_AMBULATORY_CARE_PROVIDER_SITE_OTHER): Payer: Medicare Other | Admitting: Vascular Surgery

## 2012-07-29 ENCOUNTER — Encounter: Payer: Self-pay | Admitting: Vascular Surgery

## 2012-07-29 ENCOUNTER — Encounter (INDEPENDENT_AMBULATORY_CARE_PROVIDER_SITE_OTHER): Payer: Medicare Other | Admitting: Vascular Surgery

## 2012-07-29 VITALS — BP 131/81 | HR 58 | Resp 16 | Ht 68.0 in | Wt 188.0 lb

## 2012-07-29 DIAGNOSIS — I714 Abdominal aortic aneurysm, without rupture: Secondary | ICD-10-CM | POA: Diagnosis not present

## 2012-07-29 NOTE — Progress Notes (Signed)
[facility] [place]  Patient name: [name] MRN: [id number] DOB: 1926/09/14 Sex: male  REASON FOR CONSULT: reestablish follow up of abdominal aortic aneurysm.  HPI: [name] is a 79 y.o. male who I had seen previously back in March in 2011 with 3.9 cm infrarenal abdominal aortic aneurysm. I recommended yearly follow up and he continued that with Dr. Andee Lineman. However he now would like [place] to continue to fall the aneurysm is Dr. Andee Lineman has moved. Of note, he denies any abdominal pain or back pain. He is not a smoker. He is unaware of any history of abdominal aortic aneurysms in the family.  Past Medical History  Diagnosis Date  . Orthostatic hypotension   . AAA (abdominal aortic aneurysm)   . Atrial fibrillation     bicarbonate perfusion study 10/11 EF 69%. small defects no ischemia. 7 metastases acheived. ER visit 11/10/09 ruled out MI normal sinus rhythm orthostasis after sublingual nitroglycerin     FAMILY HISTORY: There is no history of premature cardiovascular disease.  SOCIAL HISTORY: History  Substance Use Topics  . Smoking status: Former Smoker -- 1.00 packs/day for 10 years    Types: Cigarettes    Quit date: 01/28/1954  . Smokeless tobacco: Never Used     Comment: tobacco use - no  . Alcohol Use: No   Allergies  Allergen Reactions  . Flexeril (Cyclobenzaprine Hcl)   . Percocet (Oxycodone-Acetaminophen)   . Rosuvastatin     REACTION: N/V  . Sulfonamide Derivatives     REACTION: RASH  . Vicodin (Hydrocodone-Acetaminophen)    Current Outpatient Prescriptions  Medication Sig Dispense Refill  . aspirin 81 MG EC tablet Take 81 mg by mouth daily.        . Cholecalciferol (VITAMIN D) 400 UNITS capsule Take 800 Units by mouth daily.        . dorzolamide (TRUSOPT) 2 % ophthalmic solution Place 1 drop into the right eye daily.        Marland Kitchen esomeprazole (NEXIUM) 40 MG capsule Take 40 mg by mouth daily.        . finasteride (PROSCAR) 5 MG tablet  Take 5 mg by mouth daily.        Marland Kitchen latanoprost (XALATAN) 0.005 % ophthalmic solution Place 1 drop into the right eye 2 (two) times daily.        . Multiple Vitamin (MULTIVITAMIN) tablet Take 1 tablet by mouth daily.        . nitroGLYCERIN (NITROSTAT) 0.4 MG SL tablet Place 0.4 mg under the tongue every 5 (five) minutes as needed.        . Omega-3 Fatty Acids (FISH OIL) 1000 MG CAPS Take 2 capsules by mouth 2 (two) times daily.        . pindolol (VISKEN) 5 MG tablet Take 0.5 tablets (2.5 mg total) by mouth daily.  15 tablet  0  . testosterone cypionate (DEPOTESTOTERONE CYPIONATE) 200 MG/ML injection Inject 200 mg into the muscle every 28 (twenty-eight) days. 5 ml        No current facility-administered medications for this visit.   REVIEW OF SYSTEMS: Arly.Keller ] denotes positive finding; [  ] denotes negative finding  CARDIOVASCULAR:  [ ]  chest pain   [ ]  chest pressure   Arly.Keller ] palpitations   Arly.Keller ] orthopnea   Arly.Keller ] dyspnea on exertion   [ ]  claudication   [ ]  rest pain   [ ]  DVT   [ ]  phlebitis PULMONARY:   [ ]   productive cough   [ ]  asthma   [ ]  wheezing NEUROLOGIC:   [ ]  weakness  [ ]  paresthesias  [ ]  aphasia  [ ]  amaurosis  [ ]  dizziness HEMATOLOGIC:   [ ]  bleeding problems   [ ]  clotting disorders MUSCULOSKELETAL:  [ ]  joint pain   [ ]  joint swelling Arly.Keller ] leg swelling GASTROINTESTINAL: [ ]   blood in stool  [ ]   hematemesis GENITOURINARY:  [ ]   dysuria  [ ]   hematuria PSYCHIATRIC:  [ ]  history of major depression INTEGUMENTARY:  [ ]  rashes  [ ]  ulcers CONSTITUTIONAL:  [ ]  fever   [ ]  chills  PHYSICAL EXAM: Filed Vitals:   07/29/12 0956  BP: 131/81  Pulse: 58  Resp: 16  Height: 5\' 8"  (1.727 m)  Weight: 188 lb (85.276 kg)  SpO2: 98%   Body mass index is 28.59 kg/(m^2). GENERAL: The patient is a well-nourished male, in no acute distress. The vital signs are documented above. CARDIOVASCULAR: There is a regular rate and rhythm. I do not detect carotid bruits. He has palpable femoral pulses,  popliteal pulses, and a left dorsalis pedis pulse. Both feet are warm and well-perfused. He has minimal lower extremity swelling. PULMONARY: There is good air exchange bilaterally without wheezing or rales. ABDOMEN: Soft and non-tender with normal pitched bowel sounds. His aneurysm is palpable and nontender. He does have a ventral hernia. MUSCULOSKELETAL: There are no major deformities or cyanosis. NEUROLOGIC: No focal weakness or paresthesias are detected. SKIN: There are no ulcers or rashes noted. PSYCHIATRIC: The patient has a normal affect.  DATA:  I have independently interpreted his duplex of his aneurysm which shows that the maximum diameter of the aneurysm is 4.5 cm. The maximum diameter of the right common iliac artery is 1.37 cm. The maximum diameter of the left common iliac artery is 1.39 cm.  MEDICAL ISSUES:  ABDOMINAL AORTIC ANEURYSM His abdominal aortic aneurysm is 4.5 cm in maximum diameter. This has not looked enlarge significantly. He has been followed previously by Dr.DeGent, but would now like [place] to follow the aneurysm. I've ordered a follow up ultrasound in 9 months and I'll see him back at that time. He knows to call sooner if he has problems. He understands we would not consider elective repair of the aneurysm unless it reached 5.5 cm in maximum diameter.   DICKSON,CHRISTOPHER S [facility] [place] Beeper: [phone]

## 2012-07-29 NOTE — Addendum Note (Signed)
Addended by: Adria Dill L on: 07/29/2012 10:33 AM   Modules accepted: Orders

## 2012-07-29 NOTE — Assessment & Plan Note (Signed)
His abdominal aortic aneurysm is 4.5 cm in maximum diameter. This has not looked enlarge significantly. He has been followed previously by Dr.DeGent, but would now like [place] to follow the aneurysm. I've ordered a follow up ultrasound in 9 months and I'll see him back at that time. He knows to call sooner if he has problems. He understands we would not consider elective repair of the aneurysm unless it reached 5.5 cm in maximum diameter.

## 2012-08-04 DIAGNOSIS — H4011X0 Primary open-angle glaucoma, stage unspecified: Secondary | ICD-10-CM | POA: Diagnosis not present

## 2012-08-04 DIAGNOSIS — H2510 Age-related nuclear cataract, unspecified eye: Secondary | ICD-10-CM | POA: Diagnosis not present

## 2012-08-04 DIAGNOSIS — H409 Unspecified glaucoma: Secondary | ICD-10-CM | POA: Diagnosis not present

## 2012-08-10 DIAGNOSIS — Z85828 Personal history of other malignant neoplasm of skin: Secondary | ICD-10-CM | POA: Diagnosis not present

## 2012-08-10 DIAGNOSIS — L570 Actinic keratosis: Secondary | ICD-10-CM | POA: Diagnosis not present

## 2012-08-11 ENCOUNTER — Other Ambulatory Visit (INDEPENDENT_AMBULATORY_CARE_PROVIDER_SITE_OTHER): Payer: Self-pay | Admitting: *Deleted

## 2012-08-11 ENCOUNTER — Telehealth (INDEPENDENT_AMBULATORY_CARE_PROVIDER_SITE_OTHER): Payer: Self-pay | Admitting: *Deleted

## 2012-08-11 ENCOUNTER — Encounter (INDEPENDENT_AMBULATORY_CARE_PROVIDER_SITE_OTHER): Payer: Self-pay | Admitting: Internal Medicine

## 2012-08-11 ENCOUNTER — Ambulatory Visit (INDEPENDENT_AMBULATORY_CARE_PROVIDER_SITE_OTHER): Payer: Medicare Other | Admitting: Internal Medicine

## 2012-08-11 VITALS — BP 128/64 | HR 60 | Temp 98.0°F | Ht 68.0 in | Wt 189.3 lb

## 2012-08-11 DIAGNOSIS — Z1211 Encounter for screening for malignant neoplasm of colon: Secondary | ICD-10-CM

## 2012-08-11 DIAGNOSIS — D126 Benign neoplasm of colon, unspecified: Secondary | ICD-10-CM

## 2012-08-11 DIAGNOSIS — R935 Abnormal findings on diagnostic imaging of other abdominal regions, including retroperitoneum: Secondary | ICD-10-CM

## 2012-08-11 MED ORDER — PEG-KCL-NACL-NASULF-NA ASC-C 100 G PO SOLR: 1.0000 | Freq: Once | ORAL | Status: DC

## 2012-08-11 NOTE — Telephone Encounter (Signed)
Patient needs movi prep 

## 2012-08-11 NOTE — Patient Instructions (Addendum)
Surveillance colonoscopy. The risks and benefits such as perforation, bleeding, and infection were reviewed with the patient and is agreeable. 

## 2012-08-11 NOTE — Progress Notes (Signed)
Subjective:     Patient ID: [name], male   DOB: 12-Jan-1927, 79 y.o.   MRN: [id number]  HPI Referred to our office by Dr. Bjorn Pippin for possible cecal diverticulitis.  Seen at [facility] on 07/05/12 for burning during urination.  He also apparently had a fever per ED notes. He had low back pain. He did not have any abdominal pain during that visit. CT scan w/o CM revealed Normal caliber large small bowel throughout the abdomen. No evidence of obstruction. Extensive colonic diverticulosis without evidence of active inflammation. There is fullness in the region of the cecum which is nonspecific. No free fluid or suspicious adenopathy. Impression: Marked prostatomegaly with coarse central calcifications and indentation of the bladder base. This may be etiology of the patient's dysuria. Tiny solitary punctate renal stones bilaterally. Slight interval growth in the size of a fusiform infrarenal abdominal aortic aneurysm which is currently measures 4.2 cm. Nonspecific free fluid in the rectcovesicular recess is an abnormal finding in a male patient. Although no definite causative abnormality is identified, a subacute infectious or inflammatory process involving the bowel is difficult to exclude entirely. Moderately large hiatal hernia.   07/05/2012 WBC 3.8, H and H 14.2 and 40.1, Platelet ct 96. NA 126, K 3.9, Chloride 98, BUN 13, Creatinine 1.14, Glucose 143, Calcium 8.1 Urinalysis: Nitrates negative, Leukocytes: negative, Bacteria: few, Mucous occ. Glucose negative.  Started on Cipro BID x 10 days by the ED per Dr. Belva Crome notes. 07/10/2012.  Colonoscopy 02/11/2006 Dr. Cleotis Nipper: recurrent rt inguinal hernia. Personal hx of adenomatous polyps and he has not had a colonoscopy for almost 6 yrs. Normal rectum. Diverticulum in the sigmoid colon. No inflammation. No colonic or rectal polyps. Normal colon, otherwise. Internal hemorrhoids.   Today he tells me he feels good.  He still has some lower back pain but  no fever. He has fleeting lower abdominal pain which comes and goes. Appetite is good. He has not lost over 5 pounds.   He has a BM a  every other day or every 3rd day. Stools are brown in color. No melena or bright red rectal bleeding. Stools are normal size.    Review of Systems see hpi Current Outpatient Prescriptions  Medication Sig Dispense Refill  . acetaminophen (TYLENOL) 325 MG tablet Take 650 mg by mouth every 6 (six) hours as needed for pain.      Marland Kitchen aspirin 81 MG EC tablet Take 81 mg by mouth daily.        . Cholecalciferol (VITAMIN D) 400 UNITS capsule Take 800 Units by mouth daily.        . dorzolamide (TRUSOPT) 2 % ophthalmic solution Place 1 drop into the right eye daily.        Marland Kitchen esomeprazole (NEXIUM) 40 MG capsule Take 40 mg by mouth daily.        . finasteride (PROSCAR) 5 MG tablet Take 5 mg by mouth daily.        . furosemide (LASIX) 20 MG tablet Take 20 mg by mouth. Twice weekly      . ibuprofen (ADVIL,MOTRIN) 200 MG tablet Take 200 mg by mouth every 6 (six) hours as needed for pain.      Marland Kitchen latanoprost (XALATAN) 0.005 % ophthalmic solution Place 1 drop into the right eye 2 (two) times daily.        . Multiple Vitamin (MULTIVITAMIN) tablet Take 1 tablet by mouth daily.        . nitroGLYCERIN (NITROSTAT) 0.4 MG  SL tablet Place 0.4 mg under the tongue every 5 (five) minutes as needed.        . Omega-3 Fatty Acids (FISH OIL) 1000 MG CAPS Take 2 capsules by mouth 2 (two) times daily.        . pindolol (VISKEN) 5 MG tablet Take 0.5 tablets (2.5 mg total) by mouth daily.  15 tablet  0   No current facility-administered medications for this visit.   Past Medical History  Diagnosis Date  . Orthostatic hypotension   . AAA (abdominal aortic aneurysm)   . Atrial fibrillation     bicarbonate perfusion study 10/11 EF 69%. small defects no ischemia. 7 metastases acheived. ER visit 11/10/09 ruled out MI normal sinus rhythm orthostasis after sublingual nitroglycerin    Past Surgical  History  Procedure Laterality Date  . Hernia repair Bilateral     Inguinal and umbilical  . Shoulder surgery Right     for open rotator cuff repair  . Wrist fracture surgery Right    Allergies  Allergen Reactions  . Flexeril (Cyclobenzaprine Hcl)   . Percocet (Oxycodone-Acetaminophen)   . Rosuvastatin     REACTION: N/V  . Sulfonamide Derivatives     REACTION: RASH  . Vicodin (Hydrocodone-Acetaminophen)         Objective:   Physical Exam  Filed Vitals:   08/11/12 1022  BP: 128/64  Pulse: 60  Temp: 98 F (36.7 C)  Height: 5\' 8"  (1.727 m)  Weight: 189 lb 4.8 oz (85.866 kg)  Alert and oriented. Skin warm and dry. Oral mucosa is moist.   . Sclera anicteric, conjunctivae is pink. Thyroid not enlarged. No cervical lymphadenopathy. Lungs clear. Heart regular rate and rhythm.  Abdomen is soft. Bowel sounds are positive. No hepatomegaly. No abdominal masses felt. No tenderness.  No edema to lower extremities.        Assessment:    Colonic adenoma.  Abnormal CT which revealed fullness in the region of the cecum which is nonspecific. I discussed this case with Dr Karilyn Cota    Plan:    Surveillance colonoscopy. The risks and benefits such as perforation, bleeding, and infection were reviewed with the patient and is agreeable.

## 2012-08-26 ENCOUNTER — Encounter (HOSPITAL_COMMUNITY): Payer: Self-pay | Admitting: Pharmacy Technician

## 2012-08-27 ENCOUNTER — Ambulatory Visit (HOSPITAL_COMMUNITY)
Admission: RE | Admit: 2012-08-27 | Discharge: 2012-08-27 | Disposition: A | Discharge status: Home / Self Care | Payer: Medicare Other | Source: Ambulatory Visit | Attending: Internal Medicine | Admitting: Internal Medicine

## 2012-08-27 ENCOUNTER — Encounter (HOSPITAL_COMMUNITY): Payer: Self-pay

## 2012-08-27 ENCOUNTER — Ambulatory Visit: Payer: Medicare Other | Admitting: Cardiovascular Disease

## 2012-08-27 ENCOUNTER — Encounter (HOSPITAL_COMMUNITY)
Admission: RE | Disposition: A | Discharge status: Home / Self Care | Payer: Self-pay | Source: Ambulatory Visit | Attending: Internal Medicine

## 2012-08-27 DIAGNOSIS — Z885 Allergy status to narcotic agent status: Secondary | ICD-10-CM | POA: Insufficient documentation

## 2012-08-27 DIAGNOSIS — N400 Enlarged prostate without lower urinary tract symptoms: Secondary | ICD-10-CM | POA: Insufficient documentation

## 2012-08-27 DIAGNOSIS — K5730 Diverticulosis of large intestine without perforation or abscess without bleeding: Secondary | ICD-10-CM | POA: Diagnosis not present

## 2012-08-27 DIAGNOSIS — Z860100 Personal history of colon polyps, unspecified: Secondary | ICD-10-CM | POA: Insufficient documentation

## 2012-08-27 DIAGNOSIS — I714 Abdominal aortic aneurysm, without rupture, unspecified: Secondary | ICD-10-CM | POA: Insufficient documentation

## 2012-08-27 DIAGNOSIS — R933 Abnormal findings on diagnostic imaging of other parts of digestive tract: Secondary | ICD-10-CM | POA: Insufficient documentation

## 2012-08-27 DIAGNOSIS — Z882 Allergy status to sulfonamides status: Secondary | ICD-10-CM | POA: Insufficient documentation

## 2012-08-27 DIAGNOSIS — I951 Orthostatic hypotension: Secondary | ICD-10-CM | POA: Diagnosis not present

## 2012-08-27 DIAGNOSIS — D126 Benign neoplasm of colon, unspecified: Secondary | ICD-10-CM

## 2012-08-27 DIAGNOSIS — Z87891 Personal history of nicotine dependence: Secondary | ICD-10-CM | POA: Insufficient documentation

## 2012-08-27 DIAGNOSIS — I4891 Unspecified atrial fibrillation: Secondary | ICD-10-CM | POA: Insufficient documentation

## 2012-08-27 DIAGNOSIS — R935 Abnormal findings on diagnostic imaging of other abdominal regions, including retroperitoneum: Secondary | ICD-10-CM

## 2012-08-27 DIAGNOSIS — Z7982 Long term (current) use of aspirin: Secondary | ICD-10-CM | POA: Insufficient documentation

## 2012-08-27 DIAGNOSIS — Z79899 Other long term (current) drug therapy: Secondary | ICD-10-CM | POA: Insufficient documentation

## 2012-08-27 HISTORY — PX: COLONOSCOPY: SHX5424

## 2012-08-27 SURGERY — COLONOSCOPY
Anesthesia: Moderate Sedation

## 2012-08-27 MED ORDER — SODIUM CHLORIDE 0.9 % IV SOLN
INTRAVENOUS | Status: DC
  Administered 2012-08-27: 10:00:00 via INTRAVENOUS

## 2012-08-27 MED ORDER — MIDAZOLAM HCL 5 MG/5ML IJ SOLN
INTRAMUSCULAR | Status: AC
  Filled 2012-08-27: qty 10

## 2012-08-27 MED ORDER — MEPERIDINE HCL 50 MG/ML IJ SOLN
INTRAMUSCULAR | Status: DC | PRN
  Administered 2012-08-27: 25 mg via INTRAVENOUS

## 2012-08-27 MED ORDER — MIDAZOLAM HCL 5 MG/5ML IJ SOLN
INTRAMUSCULAR | Status: DC | PRN
  Administered 2012-08-27: 1 mg via INTRAVENOUS
  Administered 2012-08-27: 2 mg via INTRAVENOUS

## 2012-08-27 MED ORDER — MEPERIDINE HCL 50 MG/ML IJ SOLN
INTRAMUSCULAR | Status: AC
  Filled 2012-08-27: qty 1

## 2012-08-27 MED ORDER — STERILE WATER FOR IRRIGATION IR SOLN
Status: DC | PRN
  Administered 2012-08-27: 10:00:00

## 2012-08-27 NOTE — Op Note (Signed)
COLONOSCOPY PROCEDURE REPORT  PATIENT:  [name]  MR#:  [id number] Birthdate:  02/19/1926, 79 y.o., male Endoscopist:  Dr. Malissa Hippo, MD Referred By:  Dr. Excell Seltzer.Annabell Howells, MD Procedure Date: 08/27/2012  Procedure:   Colonoscopy  Indications: Patient is an 79 year old Caucasian male who underwent  abdomino pelvic CT for urologic indication and noted to have thickened cecal wall. He is therefore undergoing diagnostic colonoscopy. He has history of colonic adenomas and his last colonoscopy was in August 2008 [facility]  Informed Consent:  The procedure and risks were reviewed with the patient and informed consent was obtained.  Medications:  Demerol 25 mg IV Versed 3 mg IV  Description of procedure:  After a digital rectal exam was performed, that colonoscope was advanced from the anus through the rectum and colon to the area of the cecum, ileocecal valve and appendiceal orifice. The cecum was deeply intubated. These structures were well-seen and photographed for the record. From the level of the cecum and ileocecal valve, the scope was slowly and cautiously withdrawn. The mucosal surfaces were carefully surveyed utilizing scope tip to flexion to facilitate fold flattening as needed. The scope was pulled down into the rectum where a thorough exam including retroflexion was performed. Terminal ileum was also examined.  Findings:   Prep satisfactory. Normal mucosa of terminal ileu. Normal appearing ileocecal valve and cecum without polyps, mass or ulceration. Mucosa of rest of the colon was normal. Scattered diverticula throughout the colon. Normal mucosa of rectum and anorectal junction.   Therapeutic/Diagnostic Maneuvers Performed:  None  Complications:  None  Cecal Withdrawal Time:  10 minutes  Impression:  Normal terminal ileal mucosa. No evidence of cecal ulceration mass or polyp. Pancolonic diverticulosis.  Recommendations:  Standard instructions  given. No further workup needed.  REHMAN,NAJEEB U  08/27/2012 10:22 AM  CC: Dr. Juliette Alcide, MD & Dr. Bonnetta Barry ref. provider found         Dr. Excell Seltzer. Annabell Howells, MD

## 2012-08-27 NOTE — H&P (Signed)
[name] is an 79 y.o. male.   Chief Complaint: Patient is here for colonoscopy. HPI: Patient is an 79 year old Caucasian male who has history of enlarged prostate and underwent abdominopelvic CT for dysuria 0.6 09/16/2012. History of thickening to cecal wall. He is therefore undergoing colonoscopy. He has history of colonic adenomas and his last exam was in January 2008 by Dr. Cleotis Nipper of [facility]. He denies abdominal pain change in his bowel habits or rectal bleeding. History is negative for colorectal carcinoma. I have reviewed the CT films revealing thickening to cecal wall.  Past Medical History  Diagnosis Date  . Orthostatic hypotension   . AAA (abdominal aortic aneurysm)   . Atrial fibrillation     bicarbonate perfusion study 10/11 EF 69%. small defects no ischemia. 7 metastases acheived. ER visit 11/10/09 ruled out MI normal sinus rhythm orthostasis after sublingual nitroglycerin     Past Surgical History  Procedure Laterality Date  . Hernia repair Bilateral     Inguinal and umbilical  . Shoulder surgery Right     for open rotator cuff repair  . Wrist fracture surgery Right     History reviewed. No pertinent family history. Social History:  reports that he quit smoking about 58 years ago. His smoking use included Cigarettes. He has a 10 pack-year smoking history. He has never used smokeless tobacco. He reports that he does not drink alcohol or use illicit drugs.  Allergies:  Allergies  Allergen Reactions  . Flexeril (Cyclobenzaprine Hcl)   . Percocet (Oxycodone-Acetaminophen)   . Sulfonamide Derivatives     REACTION: RASH  . Vicodin (Hydrocodone-Acetaminophen)     Medications Prior to Admission  Medication Sig Dispense Refill  . acetaminophen (TYLENOL) 325 MG tablet Take 650 mg by mouth every 6 (six) hours as needed for pain.      Marland Kitchen aspirin 81 MG EC tablet Take 81 mg by mouth daily.        . Cholecalciferol (VITAMIN D) 400 UNITS capsule Take 800  Units by mouth daily.        . dorzolamide (TRUSOPT) 2 % ophthalmic solution Place 1 drop into the right eye daily.        Marland Kitchen esomeprazole (NEXIUM) 20 MG capsule Take 20 mg by mouth daily.      . finasteride (PROSCAR) 5 MG tablet Take 5 mg by mouth daily.        . furosemide (LASIX) 20 MG tablet Take 20 mg by mouth. Twice weekly      . latanoprost (XALATAN) 0.005 % ophthalmic solution Place 1 drop into the right eye 2 (two) times daily.        . nitroGLYCERIN (NITROSTAT) 0.4 MG SL tablet Place 0.4 mg under the tongue every 5 (five) minutes as needed.        . Omega-3 Fatty Acids (FISH OIL) 1000 MG CAPS Take 2 capsules by mouth 2 (two) times daily.        . peg 3350 powder (MOVIPREP) 100 G SOLR Take 1 kit (100 g total) by mouth once.  1 kit  0  . pindolol (VISKEN) 5 MG tablet Take 0.5 tablets (2.5 mg total) by mouth daily.  15 tablet  0  . ibuprofen (ADVIL,MOTRIN) 200 MG tablet Take 200 mg by mouth every 6 (six) hours as needed for pain.        No results found for this or any previous visit (from the past 48 hour(s)). No results found.  ROS  Blood  pressure 130/77, temperature 97.9 F (36.6 C), temperature source Oral, resp. rate 13, height 5\' 8"  (1.727 m), weight 190 lb (86.183 kg), SpO2 97.00%. Physical Exam  Constitutional: He appears well-developed and well-nourished.  HENT:  Mouth/Throat: Oropharynx is clear and moist.  Eyes: Conjunctivae are normal. No scleral icterus.  Neck: No thyromegaly present.  Cardiovascular: Normal rate, regular rhythm and normal heart sounds.   No murmur heard. Respiratory: Effort normal and breath sounds normal.  GI: Soft. He exhibits no distension and no mass. There is no tenderness.  Small supraumbilical hernia  Musculoskeletal: Edema: trace edema around ankles.  Lymphadenopathy:    He has no cervical adenopathy.  Neurological: He is alert.  Skin: Skin is warm and dry.     Assessment/Plan Cecal wall thickening on abdominopelvic CT. History of  colonic adenomas. Diagnostic colonoscopy.  REHMAN,NAJEEB U 08/27/2012, 9:52 AM

## 2012-08-28 ENCOUNTER — Encounter (HOSPITAL_COMMUNITY): Payer: Self-pay | Admitting: Internal Medicine

## 2012-09-01 ENCOUNTER — Encounter: Payer: Self-pay | Admitting: Cardiovascular Disease

## 2012-09-01 ENCOUNTER — Ambulatory Visit (INDEPENDENT_AMBULATORY_CARE_PROVIDER_SITE_OTHER): Payer: Medicare Other | Admitting: Cardiovascular Disease

## 2012-09-01 VITALS — BP 110/69 | HR 52 | Ht 68.0 in | Wt 188.0 lb

## 2012-09-01 DIAGNOSIS — R609 Edema, unspecified: Secondary | ICD-10-CM

## 2012-09-01 DIAGNOSIS — R001 Bradycardia, unspecified: Secondary | ICD-10-CM

## 2012-09-01 DIAGNOSIS — I714 Abdominal aortic aneurysm, without rupture, unspecified: Secondary | ICD-10-CM

## 2012-09-01 DIAGNOSIS — I4891 Unspecified atrial fibrillation: Secondary | ICD-10-CM | POA: Diagnosis not present

## 2012-09-01 DIAGNOSIS — I2510 Atherosclerotic heart disease of native coronary artery without angina pectoris: Secondary | ICD-10-CM

## 2012-09-01 DIAGNOSIS — R600 Localized edema: Secondary | ICD-10-CM

## 2012-09-01 DIAGNOSIS — I498 Other specified cardiac arrhythmias: Secondary | ICD-10-CM

## 2012-09-01 MED ORDER — METOPROLOL TARTRATE 25 MG PO TABS: 12.5000 mg | ORAL_TABLET | Freq: Every morning | ORAL | Status: DC

## 2012-09-01 NOTE — Patient Instructions (Addendum)
Your physician recommends that you schedule a follow-up appointment in: 1 year with Dr. Purvis Sheffield . You should receive a letter in the mail in 10 months. If you do not receive a letter by June 2015 call our office to schedule this appointment.   Record you blood pressure and heart rate for 3 weeks starting in 1 week and call our office with results. Phone number [phone]  Your physician has recommended you make the following change in your medication:  Stop: Pindolol 5MG   Start: Metoprolol 12.5 mg every morning. (You will get a 25 mg tablet take 1/2 of this)  Continue all other medications.

## 2012-09-01 NOTE — Progress Notes (Signed)
Patient ID: [name], male   DOB: 1926-08-26, 79 y.o.   MRN: [id number]    SUBJECTIVE: [name] is an 79 year old patient with no prior history of significant coronary artery disease. Previously he had a myocardial perfusion imaging scan which showed normal LV perfusion and ejection fraction of 69% with 2 small fixed defect but no ischemia.  An echocardiogram in September 2013 revealed normal LV systolic function, EF 55-60%.  The patient reports no chest pain. He does have a history of paroxysmal atrial fibrillation but has remained in normal sinus rhythm. He reports no recurrent palpitations. He denies lightheadedness. He seldom gets shortness of breath, and only if he overexerts himself.  He does have a history of prior deep venous thrombosis and pulmonary embolism in 2008 in the setting of an ankle injury when orthosis was applied. He was treated for 6 months with warfarin and has had no recurrence.   The patient has some chronic lower extremity edema which is largely dependent but no evidence or history of heart failure. He does have vascular disease with an aortic aneurysm which is followed by his PCP. It was last imaged on July 29, 2012, and it was 4.43 x 4.51 cm with intramural thrombus present.  He used to enjoy walking and playing racquetball. He once walked 236 miles in a shopping mall competition in one month.   Filed Vitals:   09/01/12 0844  BP: 110/69  Pulse: 52  Height: 5\' 8"  (1.727 m)  Weight: 188 lb (85.276 kg)     PHYSICAL EXAM General: NAD Neck: No JVD, no thyromegaly or thyroid nodule.  Lungs: Clear to auscultation bilaterally with normal respiratory effort. CV: Nondisplaced PMI.  Heart bradycardic but regular, normal S1/S2, no S3/S4, no murmur.  1+ pitting lower extremity edema.  No carotid bruit.  Normal pedal pulses.  Abdomen: Soft, nontender, no hepatosplenomegaly, no distention.  Neurologic: Alert and oriented x 3.  Psych: Normal  affect. Extremities: No clubbing or cyanosis.   ECG: sinus bradycardia, 50 bpm, nonspecific T wave abnormality  LABS: Basic Metabolic Panel: No results found for this basename: NA, K, CL, CO2, GLUCOSE, BUN, CREATININE, CALCIUM, MG, PHOS,  in the last 72 hours Liver Function Tests: No results found for this basename: AST, ALT, ALKPHOS, BILITOT, PROT, ALBUMIN,  in the last 72 hours No results found for this basename: LIPASE, AMYLASE,  in the last 72 hours CBC: No results found for this basename: WBC, NEUTROABS, HGB, HCT, MCV, PLT,  in the last 72 hours Cardiac Enzymes: No results found for this basename: CKTOTAL, CKMB, CKMBINDEX, TROPONINI,  in the last 72 hours BNP: No components found with this basename: POCBNP,  D-Dimer: No results found for this basename: DDIMER,  in the last 72 hours Hemoglobin A1C: No results found for this basename: HGBA1C,  in the last 72 hours Fasting Lipid Panel: No results found for this basename: CHOL, HDL, LDLCALC, TRIG, CHOLHDL, LDLDIRECT,  in the last 72 hours Thyroid Function Tests: No results found for this basename: TSH, T4TOTAL, FREET3, T3FREE, THYROIDAB,  in the last 72 hours Anemia Panel: No results found for this basename: VITAMINB12, FOLATE, FERRITIN, TIBC, IRON, RETICCTPCT,  in the last 72 hours      ASSESSMENT AND PLAN: Coronary artery disease No recurrent chest pain. Continue healthy lifestyle and ASA.  Abdominal aneurysm Follow-up by the patient's primary care physician. It was last imaged on July 29, 2012, and it was 4.43 x 4.51 cm with intramural thrombus present. Again,  he is on ASA.  Paroxysmal atrial fibrillation He is bradycardic today. He denies palpitations. Will switch from Pindolol to Metoprolol 12.5 mg daily, and have him monitor his HR and BP 4 times a week for the next 3-4 weeks, and to inform me of what these values are.  Edema I encouraged him to reduce his salt intake. He can take extra Lasix as needed when he has  edema.     Prentice Docker, M.D., F.A.C.C.

## 2012-09-14 ENCOUNTER — Encounter (INDEPENDENT_AMBULATORY_CARE_PROVIDER_SITE_OTHER): Payer: Self-pay

## 2012-09-15 ENCOUNTER — Encounter (INDEPENDENT_AMBULATORY_CARE_PROVIDER_SITE_OTHER): Payer: Self-pay

## 2012-10-09 DIAGNOSIS — I10 Essential (primary) hypertension: Secondary | ICD-10-CM | POA: Diagnosis not present

## 2012-10-09 DIAGNOSIS — E782 Mixed hyperlipidemia: Secondary | ICD-10-CM | POA: Diagnosis not present

## 2012-10-09 DIAGNOSIS — K219 Gastro-esophageal reflux disease without esophagitis: Secondary | ICD-10-CM | POA: Diagnosis not present

## 2012-10-16 DIAGNOSIS — E871 Hypo-osmolality and hyponatremia: Secondary | ICD-10-CM | POA: Diagnosis not present

## 2012-10-16 DIAGNOSIS — I4891 Unspecified atrial fibrillation: Secondary | ICD-10-CM | POA: Diagnosis not present

## 2012-10-16 DIAGNOSIS — I729 Aneurysm of unspecified site: Secondary | ICD-10-CM | POA: Diagnosis not present

## 2012-10-16 DIAGNOSIS — N400 Enlarged prostate without lower urinary tract symptoms: Secondary | ICD-10-CM | POA: Diagnosis not present

## 2012-10-16 DIAGNOSIS — N410 Acute prostatitis: Secondary | ICD-10-CM | POA: Diagnosis not present

## 2012-11-10 DIAGNOSIS — L570 Actinic keratosis: Secondary | ICD-10-CM | POA: Diagnosis not present

## 2012-11-10 DIAGNOSIS — Z85828 Personal history of other malignant neoplasm of skin: Secondary | ICD-10-CM | POA: Diagnosis not present

## 2012-12-03 ENCOUNTER — Other Ambulatory Visit: Payer: Self-pay

## 2012-12-03 DIAGNOSIS — Z23 Encounter for immunization: Secondary | ICD-10-CM | POA: Diagnosis not present

## 2013-01-04 DIAGNOSIS — R972 Elevated prostate specific antigen [PSA]: Secondary | ICD-10-CM | POA: Diagnosis not present

## 2013-01-08 ENCOUNTER — Ambulatory Visit (INDEPENDENT_AMBULATORY_CARE_PROVIDER_SITE_OTHER): Payer: Medicare Other | Admitting: Urology

## 2013-01-08 DIAGNOSIS — R972 Elevated prostate specific antigen [PSA]: Secondary | ICD-10-CM

## 2013-01-08 DIAGNOSIS — E291 Testicular hypofunction: Secondary | ICD-10-CM

## 2013-01-08 DIAGNOSIS — N401 Enlarged prostate with lower urinary tract symptoms: Secondary | ICD-10-CM | POA: Diagnosis not present

## 2013-01-12 DIAGNOSIS — E291 Testicular hypofunction: Secondary | ICD-10-CM | POA: Diagnosis not present

## 2013-02-09 DIAGNOSIS — H4011X0 Primary open-angle glaucoma, stage unspecified: Secondary | ICD-10-CM | POA: Diagnosis not present

## 2013-02-09 DIAGNOSIS — H409 Unspecified glaucoma: Secondary | ICD-10-CM | POA: Diagnosis not present

## 2013-02-09 DIAGNOSIS — D2310 Other benign neoplasm of skin of unspecified eyelid, including canthus: Secondary | ICD-10-CM | POA: Diagnosis not present

## 2013-02-09 DIAGNOSIS — H01009 Unspecified blepharitis unspecified eye, unspecified eyelid: Secondary | ICD-10-CM | POA: Diagnosis not present

## 2013-03-02 DIAGNOSIS — Z85828 Personal history of other malignant neoplasm of skin: Secondary | ICD-10-CM | POA: Diagnosis not present

## 2013-03-02 DIAGNOSIS — L570 Actinic keratosis: Secondary | ICD-10-CM | POA: Diagnosis not present

## 2013-03-23 ENCOUNTER — Other Ambulatory Visit: Payer: Self-pay | Admitting: Cardiovascular Disease

## 2013-04-08 DIAGNOSIS — E871 Hypo-osmolality and hyponatremia: Secondary | ICD-10-CM | POA: Diagnosis not present

## 2013-04-08 DIAGNOSIS — I10 Essential (primary) hypertension: Secondary | ICD-10-CM | POA: Diagnosis not present

## 2013-04-08 DIAGNOSIS — I729 Aneurysm of unspecified site: Secondary | ICD-10-CM | POA: Diagnosis not present

## 2013-04-08 DIAGNOSIS — I4891 Unspecified atrial fibrillation: Secondary | ICD-10-CM | POA: Diagnosis not present

## 2013-04-08 DIAGNOSIS — M818 Other osteoporosis without current pathological fracture: Secondary | ICD-10-CM | POA: Diagnosis not present

## 2013-04-08 DIAGNOSIS — E7800 Pure hypercholesterolemia, unspecified: Secondary | ICD-10-CM | POA: Diagnosis not present

## 2013-04-08 DIAGNOSIS — K219 Gastro-esophageal reflux disease without esophagitis: Secondary | ICD-10-CM | POA: Diagnosis not present

## 2013-04-27 ENCOUNTER — Encounter: Payer: Self-pay | Admitting: Vascular Surgery

## 2013-04-28 ENCOUNTER — Ambulatory Visit (HOSPITAL_COMMUNITY)
Admission: RE | Admit: 2013-04-28 | Discharge: 2013-04-28 | Disposition: A | Discharge status: Home / Self Care | Payer: Medicare Other | Source: Ambulatory Visit | Attending: Vascular Surgery | Admitting: Vascular Surgery

## 2013-04-28 ENCOUNTER — Encounter: Payer: Self-pay | Admitting: Vascular Surgery

## 2013-04-28 ENCOUNTER — Ambulatory Visit (INDEPENDENT_AMBULATORY_CARE_PROVIDER_SITE_OTHER): Payer: Medicare Other | Admitting: Vascular Surgery

## 2013-04-28 VITALS — BP 124/77 | HR 52 | Ht 68.0 in | Wt 190.8 lb

## 2013-04-28 DIAGNOSIS — I714 Abdominal aortic aneurysm, without rupture, unspecified: Secondary | ICD-10-CM | POA: Insufficient documentation

## 2013-04-28 NOTE — Addendum Note (Signed)
Addended by: Dorthula Rue L on: 04/28/2013 05:08 PM   Modules accepted: Orders

## 2013-04-28 NOTE — Assessment & Plan Note (Signed)
His aneurysm has remained stable in size at 4.5 cm in the last 9 months. This reason I think it is safe to stretch his follow up out to one year. I have ordered a follow up duplex scan in 1 year and I'll see him back at that time. He understands we would not consider elective aneurysm repair unless the aneurysm reached 5.5 cm. Fortunately he is not a smoker. His blood pressure is under good control.

## 2013-04-28 NOTE — Progress Notes (Signed)
[facility] [place]  Patient name: [name] MRN: [id number] DOB: 1926-05-20 Sex: male  REASON FOR VISIT: Follow up of abdominal aortic aneurysm  HPI: [name] is a 80 y.o. male with a history of an abdominal aortic aneurysm. He comes in for a 9 month follow up visit. When I saw him last on 07/29/12, the aneurysm measured 4.5 cm in maximum diameter. The right common iliac artery was 1.37 cm. The left common iliac artery was 1.39 cm.   Since I saw him last, he denies any abdominal pain. He denies any significant back pain. There have been no significant changes in his medical history.   Past Medical History  Diagnosis Date  . Orthostatic hypotension   . AAA (abdominal aortic aneurysm)   . Atrial fibrillation     bicarbonate perfusion study 10/11 EF 69%. small defects no ischemia. 7 metastases acheived. ER visit 11/10/09 ruled out MI normal sinus rhythm orthostasis after sublingual nitroglycerin    History reviewed. No pertinent family history. SOCIAL HISTORY: History  Substance Use Topics  . Smoking status: Former Smoker -- 1.00 packs/day for 10 years    Types: Cigarettes    Quit date: 01/28/1954  . Smokeless tobacco: Never Used     Comment: tobacco use - no  . Alcohol Use: No   Allergies  Allergen Reactions  . Flexeril [Cyclobenzaprine Hcl]   . Percocet [Oxycodone-Acetaminophen]   . Sulfonamide Derivatives     REACTION: RASH  . Vicodin [Hydrocodone-Acetaminophen]    Current Outpatient Prescriptions  Medication Sig Dispense Refill  . aspirin 81 MG EC tablet Take 81 mg by mouth daily.        . Cholecalciferol (VITAMIN D) 400 UNITS capsule Take 800 Units by mouth daily.        . dorzolamide (TRUSOPT) 2 % ophthalmic solution Place 1 drop into the right eye 2 (two) times daily.       Marland Kitchen esomeprazole (NEXIUM) 20 MG capsule Take 20 mg by mouth daily.      . finasteride (PROSCAR) 5 MG tablet Take 5 mg by mouth daily.        . furosemide (LASIX)  20 MG tablet Take 20 mg by mouth. Twice weekly      . latanoprost (XALATAN) 0.005 % ophthalmic solution Place 1 drop into the right eye at bedtime.       . metoprolol tartrate (LOPRESSOR) 25 MG tablet TAKE 1/2 TABLET BY MOUTH EVERY MORNING  15 tablet  6  . Multiple Vitamins-Minerals (CENTRUM SILVER PO) Take 1 tablet by mouth daily.      . Omega-3 Fatty Acids (FISH OIL) 1000 MG CAPS Take 2 capsules by mouth daily.        No current facility-administered medications for this visit.   REVIEW OF SYSTEMS: Valu.Nieves ] denotes positive finding; [  ] denotes negative finding  CARDIOVASCULAR:  [ ]  chest pain   [ ]  chest pressure   [ ]  palpitations   [ ]  orthopnea   Valu.Nieves ] dyspnea on exertion   [ ]  claudication   [ ]  rest pain   [ ]  DVT   [ ]  phlebitis PULMONARY:   [ ]  productive cough   [ ]  asthma   [ ]  wheezing NEUROLOGIC:   [ ]  weakness  [ ]  paresthesias  [ ]  aphasia  [ ]  amaurosis  [ ]  dizziness HEMATOLOGIC:   [ ]  bleeding problems   [ ]  clotting disorders MUSCULOSKELETAL:  [ ]   joint pain   [ ]  joint swelling [ ]  leg swelling GASTROINTESTINAL: [ ]   blood in stool  [ ]   hematemesis GENITOURINARY:  [ ]   dysuria  [ ]   hematuria PSYCHIATRIC:  [ ]  history of major depression INTEGUMENTARY:  [ ]  rashes  [ ]  ulcers CONSTITUTIONAL:  [ ]  fever   [ ]  chills  PHYSICAL EXAM: Filed Vitals:   04/28/13 0937  BP: 124/77  Pulse: 52  Height: 5\' 8"  (1.727 m)  Weight: 190 lb 12.8 oz (86.546 kg)  SpO2: 96%   Body mass index is 29.02 kg/(m^2). GENERAL: The patient is a well-nourished male, in no acute distress. The vital signs are documented above. CARDIOVASCULAR: There is a regular rate and rhythm. I do not detect carotid bruits. He has palpable femoral and popliteal pulses bilaterally. I cannot palpate pedal pulses however both feet are warm and well-perfused. He has mild bilateral lower extremity swelling. PULMONARY: There is good air exchange bilaterally without wheezing or rales. ABDOMEN: Soft and non-tender with  normal pitched bowel sounds. It is difficult to palpate his aneurysm. His abdomen is nontender. MUSCULOSKELETAL: There are no major deformities or cyanosis. NEUROLOGIC: No focal weakness or paresthesias are detected. SKIN: There are no ulcers or rashes noted. PSYCHIATRIC: The patient has a normal affect.  DATA:  DUPLEX OF ABDOMEN: I have independently interpreted his duplex today. The maximum diameter of his infrarenal aorta is 4.53 cm. Thus this has not changed in the last 9 months. The maximum diameter of the right common iliac artery is 1.2 cm. The maximum diameter of the left common iliac artery is 1.98 cm.  MEDICAL ISSUES: ABDOMINAL AORTIC ANEURYSM His aneurysm has remained stable in size at 4.5 cm in the last 9 months. This reason I think it is safe to stretch his follow up out to one year. I have ordered a follow up duplex scan in 1 year and I'll see him back at that time. He understands we would not consider elective aneurysm repair unless the aneurysm reached 5.5 cm. Fortunately he is not a smoker. His blood pressure is under good control.    Return in about 1 year (around 04/29/2014).  [facility] [place] Beeper: [phone]

## 2013-05-04 DIAGNOSIS — M818 Other osteoporosis without current pathological fracture: Secondary | ICD-10-CM | POA: Diagnosis not present

## 2013-05-04 DIAGNOSIS — K219 Gastro-esophageal reflux disease without esophagitis: Secondary | ICD-10-CM | POA: Diagnosis not present

## 2013-05-04 DIAGNOSIS — I729 Aneurysm of unspecified site: Secondary | ICD-10-CM | POA: Diagnosis not present

## 2013-05-04 DIAGNOSIS — I10 Essential (primary) hypertension: Secondary | ICD-10-CM | POA: Diagnosis not present

## 2013-05-04 DIAGNOSIS — N400 Enlarged prostate without lower urinary tract symptoms: Secondary | ICD-10-CM | POA: Diagnosis not present

## 2013-05-04 DIAGNOSIS — I4891 Unspecified atrial fibrillation: Secondary | ICD-10-CM | POA: Diagnosis not present

## 2013-05-04 DIAGNOSIS — E871 Hypo-osmolality and hyponatremia: Secondary | ICD-10-CM | POA: Diagnosis not present

## 2013-05-15 DIAGNOSIS — R42 Dizziness and giddiness: Secondary | ICD-10-CM | POA: Diagnosis not present

## 2013-05-15 DIAGNOSIS — I10 Essential (primary) hypertension: Secondary | ICD-10-CM | POA: Diagnosis not present

## 2013-05-17 ENCOUNTER — Ambulatory Visit (INDEPENDENT_AMBULATORY_CARE_PROVIDER_SITE_OTHER): Payer: Medicare Other | Admitting: Cardiovascular Disease

## 2013-05-17 VITALS — BP 131/85 | HR 47 | Ht 68.0 in | Wt 187.0 lb

## 2013-05-17 DIAGNOSIS — I714 Abdominal aortic aneurysm, without rupture, unspecified: Secondary | ICD-10-CM

## 2013-05-17 DIAGNOSIS — I498 Other specified cardiac arrhythmias: Secondary | ICD-10-CM

## 2013-05-17 DIAGNOSIS — R5381 Other malaise: Secondary | ICD-10-CM | POA: Diagnosis not present

## 2013-05-17 DIAGNOSIS — I4891 Unspecified atrial fibrillation: Secondary | ICD-10-CM | POA: Diagnosis not present

## 2013-05-17 DIAGNOSIS — I2510 Atherosclerotic heart disease of native coronary artery without angina pectoris: Secondary | ICD-10-CM | POA: Diagnosis not present

## 2013-05-17 DIAGNOSIS — R600 Localized edema: Secondary | ICD-10-CM

## 2013-05-17 DIAGNOSIS — R001 Bradycardia, unspecified: Secondary | ICD-10-CM

## 2013-05-17 DIAGNOSIS — R0989 Other specified symptoms and signs involving the circulatory and respiratory systems: Secondary | ICD-10-CM

## 2013-05-17 DIAGNOSIS — R609 Edema, unspecified: Secondary | ICD-10-CM

## 2013-05-17 DIAGNOSIS — R5383 Other fatigue: Secondary | ICD-10-CM

## 2013-05-17 DIAGNOSIS — R0609 Other forms of dyspnea: Secondary | ICD-10-CM | POA: Diagnosis not present

## 2013-05-17 DIAGNOSIS — R42 Dizziness and giddiness: Secondary | ICD-10-CM | POA: Diagnosis not present

## 2013-05-17 NOTE — Patient Instructions (Signed)
   Stop Metoprolol Continue all other medications.   Your physician has requested that you regularly monitor and record your blood pressure readings at home. Please take at varied times of the day & bring readings back to your next visit for MD review.   Follow up in  3 weeks

## 2013-05-17 NOTE — Progress Notes (Signed)
Patient ID: TUPAC JEFFUS, male   DOB: [birth year], 80 y.o.   MRN: [id number]      SUBJECTIVE: [name] is an 80 year old male with no prior history of significant coronary artery disease. Previously he had a myocardial perfusion imaging scan which showed normal LV perfusion and ejection fraction of 69% with 2 small fixed defects but no ischemia. He has a history of paroxysmal atrial fibrillation but has remained in normal sinus rhythm. An echocardiogram in September 2013 revealed normal LV systolic function, EF 24-23%.  He does have a history of prior deep venous thrombosis and pulmonary embolism in 2008 in the setting of an ankle injury when orthosis was applied. He was treated for 6 months with warfarin and has had no recurrence.  The patient has some chronic lower extremity edema which is largely dependent but no evidence or history of heart failure. He does have vascular disease with an abdominal aortic aneurysm which has been stable, and is followed by Dr. Scot Dock of [facility].  Over the weekend, he went to his PCP's office and saw a PA there, complaining of dizziness, shortness of breath, and lack of energy. His HR there was reportedly 56 bpm. He takes 12.5 mg of metoprolol daily.  Today his HR is 47 bpm.  He is here with his niece, [name]. He says he's felt fatigued and short of breath for the past 2 weeks, and has felt dizzy as well. He denies syncope. He has occasional chest pains related to GERD. [name] says that he has been fatigued for the past 2 months.  He's been monitoring his HR and BP over the last few days. BP's have been normal, and HR has ranged between 54-68 bpm.         Allergies  Allergen Reactions  . Flexeril [Cyclobenzaprine Hcl]   . Percocet [Oxycodone-Acetaminophen]   . Sulfonamide Derivatives     REACTION: RASH  . Vicodin [Hydrocodone-Acetaminophen]     Current Outpatient Prescriptions  Medication Sig Dispense Refill  . aspirin 81  MG EC tablet Take 81 mg by mouth daily.        . Cholecalciferol (VITAMIN D) 400 UNITS capsule Take 800 Units by mouth daily.        . dorzolamide (TRUSOPT) 2 % ophthalmic solution Place 1 drop into the right eye 2 (two) times daily.       Marland Kitchen esomeprazole (NEXIUM) 20 MG capsule Take 20 mg by mouth daily.      . finasteride (PROSCAR) 5 MG tablet Take 5 mg by mouth daily.        . furosemide (LASIX) 20 MG tablet Take 20 mg by mouth. Twice weekly      . latanoprost (XALATAN) 0.005 % ophthalmic solution Place 1 drop into the right eye at bedtime.       . metoprolol tartrate (LOPRESSOR) 25 MG tablet TAKE 1/2 TABLET BY MOUTH EVERY MORNING  15 tablet  6  . Multiple Vitamins-Minerals (CENTRUM SILVER PO) Take 1 tablet by mouth daily.      . Omega-3 Fatty Acids (FISH OIL) 1000 MG CAPS Take 2 capsules by mouth daily.        No current facility-administered medications for this visit.    Past Medical History  Diagnosis Date  . Orthostatic hypotension   . AAA (abdominal aortic aneurysm)   . Atrial fibrillation     bicarbonate perfusion study 10/11 EF 69%. small defects no ischemia. 7 metastases acheived. ER visit 11/10/09 ruled out  MI normal sinus rhythm orthostasis after sublingual nitroglycerin     Past Surgical History  Procedure Laterality Date  . Hernia repair Bilateral     Inguinal and umbilical  . Shoulder surgery Right     for open rotator cuff repair  . Wrist fracture surgery Right   . Colonoscopy N/A 08/27/2012    Procedure: COLONOSCOPY;  Surgeon: Rogene Houston, MD;  Location: [facility];  Service: Endoscopy;  Laterality: N/A;  325  . Cholecystectomy      History   Social History  . Marital Status: Divorced    Spouse Name: N/A    Number of Children: N/A  . Years of Education: N/A   Occupational History  . Not on file.   Social History Main Topics  . Smoking status: Former Smoker -- 1.00 packs/day for 10 years    Types: Cigarettes    Quit date: 01/28/1954  . Smokeless  tobacco: Never Used     Comment: tobacco use - no  . Alcohol Use: No  . Drug Use: No  . Sexual Activity: Not on file   Other Topics Concern  . Not on file   Social History Narrative  . No narrative on file    BP 131/85  Pulse 47    PHYSICAL EXAM General: NAD Neck: No JVD, no thyromegaly. Lungs: Clear to auscultation bilaterally with normal respiratory effort. CV: Nondisplaced PMI.  Bradycardic but normal rhythm, normal S1/S2, no S3/S4, no murmur. No pretibial or periankle edema.  No carotid bruit.  Normal pedal pulses.  Abdomen: Soft, nontender, no hepatosplenomegaly, no distention.  Neurologic: Alert and oriented x 3.  Psych: Normal affect. Extremities: No clubbing or cyanosis.   ECG: reviewed and available in electronic records.      ASSESSMENT AND PLAN:  Coronary artery disease No recurrent chest pain. Continue healthy lifestyle and ASA.  Abdominal aneurysm Follows with Dr. Scot Dock of [facility]. Deemed to be stable.  Paroxysmal atrial fibrillation He is bradycardic today. He denies palpitations. Will discontinue metoprolol to see if this alleviates what appears to be symptomatic bradycardia.  Edema He can take extra Lasix as needed when he has edema.  Dizziness and Fatigue, Shortness of Breath / Symptomatic Bradycardia Will discontinue metoprolol to see if this alleviates what appears to be symptomatic bradycardia. I've asked him to wait for the metoprolol to wash out of his system, and then to monitor BP and HR daily, particularly when he is feeling weakest. I will follow up with him in 3 weeks. If he remains bradycardic and symptomatic, consideration will be given for a pacemaker.  Dispo: f/u 3 weeks.   Kate Sable, M.D., F.A.C.C.

## 2013-05-31 DIAGNOSIS — J019 Acute sinusitis, unspecified: Secondary | ICD-10-CM | POA: Diagnosis not present

## 2013-06-07 ENCOUNTER — Ambulatory Visit (INDEPENDENT_AMBULATORY_CARE_PROVIDER_SITE_OTHER): Payer: Medicare Other | Admitting: Cardiovascular Disease

## 2013-06-07 ENCOUNTER — Encounter: Payer: Self-pay | Admitting: Cardiovascular Disease

## 2013-06-07 VITALS — BP 194/97 | HR 57 | Ht 68.0 in | Wt 196.0 lb

## 2013-06-07 DIAGNOSIS — G4710 Hypersomnia, unspecified: Secondary | ICD-10-CM | POA: Diagnosis not present

## 2013-06-07 DIAGNOSIS — R5381 Other malaise: Secondary | ICD-10-CM

## 2013-06-07 DIAGNOSIS — R0989 Other specified symptoms and signs involving the circulatory and respiratory systems: Secondary | ICD-10-CM

## 2013-06-07 DIAGNOSIS — R51 Headache: Secondary | ICD-10-CM | POA: Diagnosis not present

## 2013-06-07 DIAGNOSIS — I714 Abdominal aortic aneurysm, without rupture, unspecified: Secondary | ICD-10-CM

## 2013-06-07 DIAGNOSIS — R0609 Other forms of dyspnea: Secondary | ICD-10-CM | POA: Diagnosis not present

## 2013-06-07 DIAGNOSIS — I4891 Unspecified atrial fibrillation: Secondary | ICD-10-CM

## 2013-06-07 DIAGNOSIS — R5383 Other fatigue: Secondary | ICD-10-CM | POA: Diagnosis not present

## 2013-06-07 DIAGNOSIS — I2510 Atherosclerotic heart disease of native coronary artery without angina pectoris: Secondary | ICD-10-CM

## 2013-06-07 DIAGNOSIS — R519 Headache, unspecified: Secondary | ICD-10-CM

## 2013-06-07 DIAGNOSIS — R400 Somnolence: Secondary | ICD-10-CM

## 2013-06-07 MED ORDER — AMLODIPINE BESYLATE 5 MG PO TABS: 5.0000 mg | ORAL_TABLET | Freq: Every day | ORAL | Status: DC

## 2013-06-07 NOTE — Progress Notes (Signed)
Patient ID: [name], male   DOB: [birth year], 80 y.o.   MRN: [id number]      SUBJECTIVE: At the patient's last visit in April, I discontinued metoprolol as he had been feeling fatigued with shortness of breath and dizziness. His blood pressure was normal at that visit, and his heart rate was 47 beats per minute. Today his heart rate is 57 beats per minute, and blood pressure is 194/97. He has no further complaints of dizziness. He does remain fatigued and I am told he snores. He also admits to morning headaches. He had one episode of chest pain last week which spontaneously resolved.  In summary, he has no prior history of significant coronary artery disease. Previously he had a myocardial perfusion imaging scan which showed normal LV perfusion and ejection fraction of 69% with 2 small fixed defects but no ischemia. He has a history of paroxysmal atrial fibrillation but has remained in normal sinus rhythm.  An echocardiogram in September 2013 revealed normal LV systolic function, EF 83-41%.  He does have a history of prior deep venous thrombosis and pulmonary embolism in 2008 in the setting of an ankle injury when orthosis was applied. He was treated for 6 months with warfarin and has had no recurrence.  The patient has some chronic lower extremity edema which is largely dependent but no evidence or history of heart failure. He does have vascular disease with an abdominal aortic aneurysm which has been stable, and is followed by Dr. Scot Dock of [facility].     Allergies  Allergen Reactions  . Flexeril [Cyclobenzaprine Hcl]   . Percocet [Oxycodone-Acetaminophen]   . Sulfonamide Derivatives     REACTION: RASH  . Vicodin [Hydrocodone-Acetaminophen]     Current Outpatient Prescriptions  Medication Sig Dispense Refill  . aspirin 81 MG EC tablet Take 81 mg by mouth daily.        . Cholecalciferol (VITAMIN D) 400 UNITS capsule Take 800 Units by mouth daily.        . dorzolamide  (TRUSOPT) 2 % ophthalmic solution Place 1 drop into the right eye 2 (two) times daily.       Marland Kitchen esomeprazole (NEXIUM) 20 MG capsule Take 20 mg by mouth daily.      . finasteride (PROSCAR) 5 MG tablet Take 5 mg by mouth daily.        . furosemide (LASIX) 20 MG tablet Take 20 mg by mouth. Twice weekly      . ibuprofen (ADVIL,MOTRIN) 200 MG tablet Take 200 mg by mouth every 6 (six) hours as needed.      . latanoprost (XALATAN) 0.005 % ophthalmic solution Place 1 drop into both eyes at bedtime.       . Omega-3 Fatty Acids (FISH OIL) 1000 MG CAPS Take 2 capsules by mouth daily.        No current facility-administered medications for this visit.    Past Medical History  Diagnosis Date  . Orthostatic hypotension   . AAA (abdominal aortic aneurysm)   . Atrial fibrillation     bicarbonate perfusion study 10/11 EF 69%. small defects no ischemia. 7 metastases acheived. ER visit 11/10/09 ruled out MI normal sinus rhythm orthostasis after sublingual nitroglycerin     Past Surgical History  Procedure Laterality Date  . Hernia repair Bilateral     Inguinal and umbilical  . Shoulder surgery Right     for open rotator cuff repair  . Wrist fracture surgery Right   . Colonoscopy  N/A 08/27/2012    Procedure: COLONOSCOPY;  Surgeon: Rogene Houston, MD;  Location: [facility];  Service: Endoscopy;  Laterality: N/A;  325  . Cholecystectomy      History   Social History  . Marital Status: Divorced    Spouse Name: N/A    Number of Children: N/A  . Years of Education: N/A   Occupational History  . Not on file.   Social History Main Topics  . Smoking status: Former Smoker -- 1.00 packs/day for 10 years    Types: Cigarettes    Quit date: 01/28/1954  . Smokeless tobacco: Never Used     Comment: tobacco use - no  . Alcohol Use: No  . Drug Use: No  . Sexual Activity: Not on file   Other Topics Concern  . Not on file   Social History Narrative  . No narrative on file     Filed Vitals:    06/07/13 1401  BP: 194/97  Pulse: 57  Height: 5\' 8"  (1.727 m)  Weight: 196 lb (88.905 kg)    PHYSICAL EXAM General: NAD Neck: No JVD, no thyromegaly. Lungs: Clear to auscultation bilaterally with normal respiratory effort. CV: Nondisplaced PMI.  Regular rate and rhythm, normal S1/S2, no S3/S4, no murmur. No pretibial or periankle edema.  No carotid bruit.  Normal pedal pulses.  Abdomen: Soft, nontender, no hepatosplenomegaly, no distention.  Neurologic: Alert and oriented x 3.  Psych: Normal affect. Extremities: No clubbing or cyanosis.   ECG: reviewed and available in electronic records.      ASSESSMENT AND PLAN: Coronary artery disease One episode of chest pain since his last visit. Will continue to pursue a heart healthy lifestyle and continue ASA.  Abdominal aneurysm Follows with Dr. Scot Dock of [facility]. Deemed to be stable.   Paroxysmal atrial fibrillation He is only mildly bradycardic today. He denies palpitations and his dizziness has resolved. No clear evidence of symptomatic bradycardia.  Edema He can take extra Lasix as needed when he has edema.   Dizziness and Fatigue, Shortness of Breath / Symptomatic Bradycardia  No clear evidence of symptomatic bradycardia.  He has been monitoring his heart rate and blood pressure and heart rate has been in the high 50 to mid 60 beat per minute range. He does admit to morning headaches and I am told he snores. He also has clear evidence of hypertension with systolic readings at home in the 160-170 range. He very well may have sleep apnea. I will pursue a sleep study.  Hypertension I will start amlodipine 5 mg daily. It is quite likely being exacerbated by untreated sleep apnea.  Dispo: f/u 3 months.      Kate Sable, M.D., F.A.C.C.

## 2013-06-07 NOTE — Patient Instructions (Signed)
   Begin Amlodipine 5mg  daily - new sent to pharm Continue all other medications.   Referral for sleep study - Dr. Adriana Reams Follow up in  3 months

## 2013-06-10 ENCOUNTER — Ambulatory Visit: Payer: Medicare Other | Admitting: Cardiovascular Disease

## 2013-06-23 DIAGNOSIS — R5381 Other malaise: Secondary | ICD-10-CM | POA: Diagnosis not present

## 2013-06-23 DIAGNOSIS — R5383 Other fatigue: Secondary | ICD-10-CM | POA: Diagnosis not present

## 2013-06-23 DIAGNOSIS — G4733 Obstructive sleep apnea (adult) (pediatric): Secondary | ICD-10-CM | POA: Diagnosis not present

## 2013-06-30 DIAGNOSIS — Z6831 Body mass index (BMI) 31.0-31.9, adult: Secondary | ICD-10-CM | POA: Diagnosis not present

## 2013-06-30 DIAGNOSIS — G4733 Obstructive sleep apnea (adult) (pediatric): Secondary | ICD-10-CM | POA: Diagnosis not present

## 2013-07-02 DIAGNOSIS — G4733 Obstructive sleep apnea (adult) (pediatric): Secondary | ICD-10-CM | POA: Diagnosis not present

## 2013-07-07 DIAGNOSIS — G4761 Periodic limb movement disorder: Secondary | ICD-10-CM | POA: Diagnosis not present

## 2013-07-07 DIAGNOSIS — G4733 Obstructive sleep apnea (adult) (pediatric): Secondary | ICD-10-CM | POA: Diagnosis not present

## 2013-07-07 DIAGNOSIS — Z6831 Body mass index (BMI) 31.0-31.9, adult: Secondary | ICD-10-CM | POA: Diagnosis not present

## 2013-07-11 DIAGNOSIS — G4733 Obstructive sleep apnea (adult) (pediatric): Secondary | ICD-10-CM | POA: Diagnosis not present

## 2013-08-10 DIAGNOSIS — D2310 Other benign neoplasm of skin of unspecified eyelid, including canthus: Secondary | ICD-10-CM | POA: Diagnosis not present

## 2013-08-10 DIAGNOSIS — H409 Unspecified glaucoma: Secondary | ICD-10-CM | POA: Diagnosis not present

## 2013-08-10 DIAGNOSIS — H4011X0 Primary open-angle glaucoma, stage unspecified: Secondary | ICD-10-CM | POA: Diagnosis not present

## 2013-08-10 DIAGNOSIS — H2510 Age-related nuclear cataract, unspecified eye: Secondary | ICD-10-CM | POA: Diagnosis not present

## 2013-09-01 DIAGNOSIS — G4733 Obstructive sleep apnea (adult) (pediatric): Secondary | ICD-10-CM | POA: Diagnosis not present

## 2013-09-09 ENCOUNTER — Ambulatory Visit (INDEPENDENT_AMBULATORY_CARE_PROVIDER_SITE_OTHER): Payer: Medicare Other | Admitting: Cardiovascular Disease

## 2013-09-09 ENCOUNTER — Encounter: Payer: Self-pay | Admitting: Cardiovascular Disease

## 2013-09-09 VITALS — BP 141/78 | HR 54 | Ht 68.0 in | Wt 186.0 lb

## 2013-09-09 DIAGNOSIS — I714 Abdominal aortic aneurysm, without rupture, unspecified: Secondary | ICD-10-CM

## 2013-09-09 DIAGNOSIS — I10 Essential (primary) hypertension: Secondary | ICD-10-CM

## 2013-09-09 DIAGNOSIS — R609 Edema, unspecified: Secondary | ICD-10-CM

## 2013-09-09 DIAGNOSIS — G4730 Sleep apnea, unspecified: Secondary | ICD-10-CM

## 2013-09-09 DIAGNOSIS — I498 Other specified cardiac arrhythmias: Secondary | ICD-10-CM

## 2013-09-09 DIAGNOSIS — I4891 Unspecified atrial fibrillation: Secondary | ICD-10-CM | POA: Diagnosis not present

## 2013-09-09 DIAGNOSIS — R600 Localized edema: Secondary | ICD-10-CM

## 2013-09-09 DIAGNOSIS — R001 Bradycardia, unspecified: Secondary | ICD-10-CM

## 2013-09-09 DIAGNOSIS — I2510 Atherosclerotic heart disease of native coronary artery without angina pectoris: Secondary | ICD-10-CM

## 2013-09-09 DIAGNOSIS — R0609 Other forms of dyspnea: Secondary | ICD-10-CM | POA: Diagnosis not present

## 2013-09-09 DIAGNOSIS — R0989 Other specified symptoms and signs involving the circulatory and respiratory systems: Secondary | ICD-10-CM

## 2013-09-09 NOTE — Progress Notes (Signed)
Patient ID: [name], male   DOB: 1926/04/04, 80 y.o.   MRN: [id number]      SUBJECTIVE: At a prior visit in April, I discontinued metoprolol as he had been feeling fatigued with shortness of breath and dizziness. His blood pressure was normal at that visit, and his heart rate was 47 beats per minute.  Today his heart rate is 53 beats per minute, and blood pressure is 141/78.  ECG in the office today demonstrates sinus bradycardia, heart rate 53 beats per minute with a nonspecific T wave abnormality. He obtained the sleep study and was formally diagnosed with sleep apnea, and is now on CPAP. He has been monitoring his heart rate and blood pressures at home and heart rates have ranged from 41 up to 72 beats per minute and blood pressures have been reasonably controlled overall.  He has no further complaints of dizziness. He has been feeling very well and feels much more energetic, is much less short of breath, and no longer has nasal congestion problems. He denies chest pain. He is here with his niece, Nigel Bridgeman, who visits with him daily and helps to looks after him.  In summary, he has no prior history of significant coronary artery disease. Previously he had a myocardial perfusion imaging scan which showed normal LV perfusion and ejection fraction of 69% with 2 small fixed defects but no ischemia. He has a history of paroxysmal atrial fibrillation but has remained in normal sinus rhythm.  An echocardiogram in September 2013 revealed normal LV systolic function, EF 97-41%.  He does have a history of prior deep venous thrombosis and pulmonary embolism in 2008 in the setting of an ankle injury when orthosis was applied. He was treated for 6 months with warfarin and has had no recurrence.  The patient has some chronic lower extremity edema which is largely dependent but no evidence or history of heart failure. He does have vascular disease with an abdominal aortic aneurysm which has been  stable, and is followed by Dr. Scot Dock of [facility].    Review of Systems: As per "subjective", otherwise negative.  Allergies  Allergen Reactions  . Flexeril [Cyclobenzaprine Hcl]   . Percocet [Oxycodone-Acetaminophen]   . Sulfonamide Derivatives     REACTION: RASH  . Vicodin [Hydrocodone-Acetaminophen]     Current Outpatient Prescriptions  Medication Sig Dispense Refill  . acetaminophen (TYLENOL) 325 MG tablet Take 650 mg by mouth every 6 (six) hours as needed.      Marland Kitchen amLODipine (NORVASC) 5 MG tablet Take 1 tablet (5 mg total) by mouth daily.  30 tablet  6  . aspirin 81 MG EC tablet Take 81 mg by mouth daily.        . Cholecalciferol (VITAMIN D) 400 UNITS capsule Take 800 Units by mouth daily.        . dorzolamide (TRUSOPT) 2 % ophthalmic solution Place 1 drop into the right eye 2 (two) times daily.       Marland Kitchen esomeprazole (NEXIUM) 20 MG capsule Take 20 mg by mouth daily.      . finasteride (PROSCAR) 5 MG tablet Take 5 mg by mouth daily.        . furosemide (LASIX) 20 MG tablet Take 20 mg by mouth as needed. Twice weekly      . ibuprofen (ADVIL,MOTRIN) 200 MG tablet Take 200 mg by mouth every 6 (six) hours as needed.      . latanoprost (XALATAN) 0.005 % ophthalmic solution Place  1 drop into both eyes at bedtime.       . Omega-3 Fatty Acids (FISH OIL) 1000 MG CAPS Take 2 capsules by mouth daily.        No current facility-administered medications for this visit.    Past Medical History  Diagnosis Date  . Orthostatic hypotension   . AAA (abdominal aortic aneurysm)   . Atrial fibrillation     bicarbonate perfusion study 10/11 EF 69%. small defects no ischemia. 7 metastases acheived. ER visit 11/10/09 ruled out MI normal sinus rhythm orthostasis after sublingual nitroglycerin     Past Surgical History  Procedure Laterality Date  . Hernia repair Bilateral     Inguinal and umbilical  . Shoulder surgery Right     for open rotator cuff repair  . Wrist fracture surgery  Right   . Colonoscopy N/A 08/27/2012    Procedure: COLONOSCOPY;  Surgeon: Rogene Houston, MD;  Location: [facility];  Service: Endoscopy;  Laterality: N/A;  325  . Cholecystectomy      History   Social History  . Marital Status: Divorced    Spouse Name: N/A    Number of Children: N/A  . Years of Education: N/A   Occupational History  . Not on file.   Social History Main Topics  . Smoking status: Former Smoker -- 1.00 packs/day for 10 years    Types: Cigarettes    Quit date: 01/28/1954  . Smokeless tobacco: Never Used     Comment: tobacco use - no  . Alcohol Use: No  . Drug Use: No  . Sexual Activity: Not on file   Other Topics Concern  . Not on file   Social History Narrative  . No narrative on file     Filed Vitals:   09/09/13 0826  BP: 141/78  Pulse: 54  Height: 5\' 8"  (1.727 m)  Weight: 186 lb (84.369 kg)  SpO2: 94%    PHYSICAL EXAM General: NAD Neck: No JVD, no thyromegaly. Lungs: Clear to auscultation bilaterally with normal respiratory effort. CV: Nondisplaced PMI.  Bradycardic, regular rhythm, normal S1/S2, no S3/S4, no murmur. No pretibial or periankle edema.  No carotid bruit.  Normal pedal pulses.  Abdomen: Soft, nontender, no hepatosplenomegaly, no distention.  Neurologic: Alert and oriented x 3.  Psych: Normal affect. Extremities: No clubbing or cyanosis.   ECG: reviewed and available in electronic records.      ASSESSMENT AND PLAN: Coronary artery disease Asymptomatic. Will continue to pursue a heart healthy lifestyle and continue ASA.  Abdominal aneurysm Follows with Dr. Scot Dock of [facility]. Deemed to be stable.   Paroxysmal atrial fibrillation He is mildly bradycardic today. He denies palpitations and his dizziness has resolved. No clear evidence of symptomatic bradycardia. No anticoagulation at this time as apparently isolated instance.  Edema He can take extra Lasix as needed when he has edema.   Dizziness and  Fatigue, Shortness of Breath / Symptomatic Bradycardia  No clear evidence of symptomatic bradycardia.  He has been monitoring his heart rate and blood pressure with results noted above.  Sleep apnea Diagnosed with sleep apnea, now on CPAP. This has likely been contributing to bradycardia, hypertension, and prior morning headaches.  Essential hypertension Now reasonably controlled on amlodipine 5 mg daily. Should become easier to control with treated sleep apnea.   Dispo: f/u 3 months.   Kate Sable, M.D., F.A.C.C.

## 2013-09-09 NOTE — Patient Instructions (Signed)
Continue all current medications. Your physician wants you to follow up in:  3 months.  You will receive a reminder letter in the mail one-two months in advance.  If you don't receive a letter, please call our office to schedule the follow up appointment   

## 2013-10-20 DIAGNOSIS — Z23 Encounter for immunization: Secondary | ICD-10-CM | POA: Diagnosis not present

## 2013-11-02 DIAGNOSIS — E782 Mixed hyperlipidemia: Secondary | ICD-10-CM | POA: Diagnosis not present

## 2013-11-02 DIAGNOSIS — I10 Essential (primary) hypertension: Secondary | ICD-10-CM | POA: Diagnosis not present

## 2013-11-15 DIAGNOSIS — K458 Other specified abdominal hernia without obstruction or gangrene: Secondary | ICD-10-CM | POA: Diagnosis not present

## 2013-11-15 DIAGNOSIS — N400 Enlarged prostate without lower urinary tract symptoms: Secondary | ICD-10-CM | POA: Diagnosis not present

## 2013-11-15 DIAGNOSIS — I480 Paroxysmal atrial fibrillation: Secondary | ICD-10-CM | POA: Diagnosis not present

## 2013-11-15 DIAGNOSIS — E782 Mixed hyperlipidemia: Secondary | ICD-10-CM | POA: Diagnosis not present

## 2013-11-15 DIAGNOSIS — E871 Hypo-osmolality and hyponatremia: Secondary | ICD-10-CM | POA: Diagnosis not present

## 2013-11-15 DIAGNOSIS — I10 Essential (primary) hypertension: Secondary | ICD-10-CM | POA: Diagnosis not present

## 2013-11-15 DIAGNOSIS — K219 Gastro-esophageal reflux disease without esophagitis: Secondary | ICD-10-CM | POA: Diagnosis not present

## 2013-11-15 DIAGNOSIS — I714 Abdominal aortic aneurysm, without rupture: Secondary | ICD-10-CM | POA: Diagnosis not present

## 2013-12-07 ENCOUNTER — Encounter: Payer: Self-pay | Admitting: Cardiovascular Disease

## 2013-12-07 ENCOUNTER — Ambulatory Visit (INDEPENDENT_AMBULATORY_CARE_PROVIDER_SITE_OTHER): Payer: Medicare Other | Admitting: Cardiovascular Disease

## 2013-12-07 VITALS — BP 128/77 | HR 55 | Ht 68.0 in | Wt 188.0 lb

## 2013-12-07 DIAGNOSIS — R600 Localized edema: Secondary | ICD-10-CM

## 2013-12-07 DIAGNOSIS — I2510 Atherosclerotic heart disease of native coronary artery without angina pectoris: Secondary | ICD-10-CM | POA: Diagnosis not present

## 2013-12-07 DIAGNOSIS — I714 Abdominal aortic aneurysm, without rupture, unspecified: Secondary | ICD-10-CM

## 2013-12-07 DIAGNOSIS — I10 Essential (primary) hypertension: Secondary | ICD-10-CM | POA: Diagnosis not present

## 2013-12-07 DIAGNOSIS — I4891 Unspecified atrial fibrillation: Secondary | ICD-10-CM

## 2013-12-07 DIAGNOSIS — G4730 Sleep apnea, unspecified: Secondary | ICD-10-CM

## 2013-12-07 DIAGNOSIS — R609 Edema, unspecified: Secondary | ICD-10-CM | POA: Diagnosis not present

## 2013-12-07 DIAGNOSIS — R0609 Other forms of dyspnea: Secondary | ICD-10-CM | POA: Diagnosis not present

## 2013-12-07 DIAGNOSIS — I480 Paroxysmal atrial fibrillation: Secondary | ICD-10-CM | POA: Diagnosis not present

## 2013-12-07 DIAGNOSIS — R001 Bradycardia, unspecified: Secondary | ICD-10-CM

## 2013-12-07 MED ORDER — AMLODIPINE BESYLATE 5 MG PO TABS: 5.0000 mg | ORAL_TABLET | Freq: Every day | ORAL | Status: DC

## 2013-12-07 NOTE — Progress Notes (Signed)
Patient ID: MARQUON ALCALA, male   DOB: 19-Jul-1926, 80 y.o.   MRN: [id number]      SUBJECTIVE: Mr. Fiumara is doing well and is without complaints. He denies chest pain, shortness of breath, leg swelling, and dizziness. He very seldom feels a "skipped beat". He is here with his girlfriend. He has a lot more energy since starting CPAP.   Review of Systems: As per "subjective", otherwise negative.  Allergies  Allergen Reactions  . Flexeril [Cyclobenzaprine Hcl]   . Percocet [Oxycodone-Acetaminophen]   . Sulfonamide Derivatives     REACTION: RASH  . Vicodin [Hydrocodone-Acetaminophen]     Current Outpatient Prescriptions  Medication Sig Dispense Refill  . acetaminophen (TYLENOL) 325 MG tablet Take 650 mg by mouth every 6 (six) hours as needed.    Marland Kitchen amLODipine (NORVASC) 5 MG tablet Take 1 tablet (5 mg total) by mouth daily. 30 tablet 6  . aspirin 81 MG EC tablet Take 81 mg by mouth daily.      . Cholecalciferol (VITAMIN D) 400 UNITS capsule Take 800 Units by mouth daily.      . dorzolamide (TRUSOPT) 2 % ophthalmic solution Place 1 drop into the right eye 2 (two) times daily.     Marland Kitchen esomeprazole (NEXIUM) 20 MG capsule Take 20 mg by mouth daily.    . finasteride (PROSCAR) 5 MG tablet Take 5 mg by mouth daily.      . furosemide (LASIX) 20 MG tablet Take 20 mg by mouth as needed. Twice weekly    . ibuprofen (ADVIL,MOTRIN) 200 MG tablet Take 200 mg by mouth every 6 (six) hours as needed.    . latanoprost (XALATAN) 0.005 % ophthalmic solution Place 1 drop into both eyes at bedtime.     . Omega-3 Fatty Acids (FISH OIL) 1000 MG CAPS Take 2 capsules by mouth daily.     Marland Kitchen UNABLE TO FIND as directed. On CPAP     No current facility-administered medications for this visit.    Past Medical History  Diagnosis Date  . Orthostatic hypotension   . AAA (abdominal aortic aneurysm)   . Atrial fibrillation     bicarbonate perfusion study 10/11 EF 69%. small defects no ischemia. 7 metastases  acheived. ER visit 11/10/09 ruled out MI normal sinus rhythm orthostasis after sublingual nitroglycerin     Past Surgical History  Procedure Laterality Date  . Hernia repair Bilateral     Inguinal and umbilical  . Shoulder surgery Right     for open rotator cuff repair  . Wrist fracture surgery Right   . Colonoscopy N/A 08/27/2012    Procedure: COLONOSCOPY;  Surgeon: Rogene Houston, MD;  Location: [facility];  Service: Endoscopy;  Laterality: N/A;  325  . Cholecystectomy      History   Social History  . Marital Status: Divorced    Spouse Name: N/A    Number of Children: N/A  . Years of Education: N/A   Occupational History  . Not on file.   Social History Main Topics  . Smoking status: Former Smoker -- 1.00 packs/day for 10 years    Types: Cigarettes    Start date: 10/15/1943    Quit date: 01/28/1954  . Smokeless tobacco: Never Used     Comment: tobacco use - no  . Alcohol Use: No  . Drug Use: No  . Sexual Activity: Not on file   Other Topics Concern  . Not on file   Social History Narrative  Filed Vitals:   12/07/13 0814  BP: 128/77  Pulse: 55  Height: 5\' 8"  (1.727 m)  Weight: 188 lb (85.276 kg)    PHYSICAL EXAM General: NAD HEENT: Normal. Neck: No JVD, no thyromegaly. Lungs: Clear to auscultation bilaterally with normal respiratory effort. CV: Nondisplaced PMI.  Bradycardic, regular rhythm with occasional premature contractions, normal S1/S2, no S3/S4, no murmur. No pretibial or periankle edema.   Abdomen: Soft, nontender, no hepatosplenomegaly, no distention.  Neurologic: Alert and oriented x 3.  Psych: Normal affect. Skin: Normal. Musculoskeletal: Normal range of motion, no gross deformities. Extremities: No clubbing or cyanosis.   ECG: Most recent ECG reviewed.    ASSESSMENT AND PLAN:  Coronary artery disease Asymptomatic. Will continue to pursue a heart healthy lifestyle and continue ASA.  Abdominal aneurysm Follows with Dr.  Scot Dock of [facility]. Deemed to be stable.   Paroxysmal atrial fibrillation He is mildly bradycardic today. He very seldom has palpitations. No clear evidence of symptomatic bradycardia. No anticoagulation at this time as apparently isolated instance.  Edema He can take extra Lasix as needed when he has edema.   Dizziness and Fatigue, Shortness of Breath / Symptomatic Bradycardia  No clear evidence of symptomatic bradycardia.  He has been monitoring his heart rate and blood pressure with results noted above.  Sleep apnea Diagnosed with sleep apnea, and regularly using CPAP. This has likely been contributing to bradycardia, hypertension, and prior morning headaches.  Essential hypertension Well controlled on amlodipine 5 mg daily.   Dispo: f/u 3-4 months.    Kate Sable, M.D., F.A.C.C.

## 2013-12-07 NOTE — Patient Instructions (Signed)
Continue all current medications. Follow up in  3-4 months.  

## 2013-12-13 ENCOUNTER — Telehealth: Payer: Self-pay | Admitting: Cardiovascular Disease

## 2013-12-13 MED ORDER — FUROSEMIDE 20 MG PO TABS: 20.0000 mg | ORAL_TABLET | ORAL | Status: DC | PRN

## 2013-12-13 NOTE — Telephone Encounter (Signed)
Done

## 2013-12-13 NOTE — Telephone Encounter (Signed)
[name] needs to get a refill on furosemide (LASIX) 20 MG tablet. [facility]

## 2013-12-15 DIAGNOSIS — M60261 Foreign body granuloma of soft tissue, not elsewhere classified, right lower leg: Secondary | ICD-10-CM | POA: Diagnosis not present

## 2014-01-04 DIAGNOSIS — K432 Incisional hernia without obstruction or gangrene: Secondary | ICD-10-CM | POA: Diagnosis not present

## 2014-01-05 NOTE — H&P (Signed)
  NTS SOAP Note  Vital Signs:  Vitals as of: 78/04/6960: Systolic 952: Diastolic 84: Heart Rate 60: Temp 98.72F: Height 73ft 8in: Weight 192Lbs 0 Ounces: Pain Level 3: BMI 29.19  BMI : 29.19 kg/m2  Subjective: This 80 year old male presents for of a hernia.  Is present at umbilicus,  below a surgical scar.  Has been present for some time,  but is increasing in size and causing him discomfort.    Review of Symptoms:  Constitutional:unremarkable   Head:negative    Eyes:unremarkable   sinus Cardiovascular:  unremarkable Respiratory:unremarkable Gastrointestindyspepsia Genitourinary:frequency back Skin:unremarkable Hematolgic/Lymphatic:unremarkable   Allergic/Immunologic:unremarkable   Past Medical History:  Reviewed  Past Medical History  Surgical History: lap chole Medical Problems: HTN Allergies: flexeril,  hydrocodone,  percocet,  sulfa,  vicodin Medications: finasteride,  amlodipine,  lasix   Social History:Reviewed  Social History  Preferred Language: English Race:  White Ethnicity: Not Hispanic / Latino Age: 71 year Marital Status:  M Alcohol: minimal   Smoking Status: Never smoker reviewed on 01/04/2014 Functional Status reviewed on 01/04/2014 ------------------------------------------------ Bathing: Normal Cooking: Normal Dressing: Normal Driving: Normal Eating: Normal Managing Meds: Normal Oral Care: Normal Shopping: Normal Toileting: Normal Transferring: Normal Walking: Normal Cognitive Status reviewed on 01/04/2014 ------------------------------------------------ Attention: Normal Decision Making: Normal Language: Normal Memory: Normal Motor: Normal Perception: Normal Problem Solving: Normal Visual and Spatial: Normal   Family History:Reviewed  Family Health History Family History is Unknown    Objective Information: General:Well appearing, well nourished in no distress. Heart:RRR, no murmur or gallop.  Normal  S1, S2.  No S3, S4.  Lungs:  CTA bilaterally, no wheezes, rhonchi, rales.  Breathing unlabored. Abdomen:Soft, NT/ND, normal bowel sounds, no HSM, no masses.  No peritoneal signs.  Reducible periumbilical hernia,  surgical scar present.  Assessment:Incisional hernia  Diagnoses: 553.21  K43.2 Incisional hernia (Incisional hernia without obstruction or gangrene)  Procedures: 84132 - OFFICE OUTPATIENT NEW 30 MINUTES    Plan:  Scheduled for an incisional herniorrhaphy with mesh on 01/31/14.  Will also evaluate a right lower leg wound at the same time which is not healing well.   Patient Education:Alternative treatments to surgery were discussed with patient (and family).  Risks and benefits  of procedure including bleeding,  infection,  mesh use,  and recurrence of the hernia were fully explained to the patient (and family) who gave informed consent. Patient/family questions were addressed.  Follow-up:Pending Surgery

## 2014-01-07 ENCOUNTER — Ambulatory Visit (INDEPENDENT_AMBULATORY_CARE_PROVIDER_SITE_OTHER): Payer: Medicare Other | Admitting: Urology

## 2014-01-07 DIAGNOSIS — N200 Calculus of kidney: Secondary | ICD-10-CM | POA: Diagnosis not present

## 2014-01-07 DIAGNOSIS — N401 Enlarged prostate with lower urinary tract symptoms: Secondary | ICD-10-CM

## 2014-01-07 DIAGNOSIS — R351 Nocturia: Secondary | ICD-10-CM

## 2014-01-26 DIAGNOSIS — M25572 Pain in left ankle and joints of left foot: Secondary | ICD-10-CM | POA: Diagnosis not present

## 2014-01-26 DIAGNOSIS — S9032XA Contusion of left foot, initial encounter: Secondary | ICD-10-CM | POA: Diagnosis not present

## 2014-01-26 NOTE — Patient Instructions (Signed)
[name]  01/26/2014   Your procedure is scheduled on:  01/31/2014  Report to [facility] at 6:15 AM.  Call this number if you have problems the morning of surgery: [phone]   Remember:   Do not eat food or drink liquids after midnight.   Take these medicines the morning of surgery with A SIP OF WATER: Amlodipine, Nexium, Proscar   Do not wear jewelry, make-up or nail polish.  Do not wear lotions, powders, or perfumes. You may wear deodorant.  Do not shave 48 hours prior to surgery. Men may shave face and neck.  Do not bring valuables to the hospital.  [facility] is not responsible for any belongings or valuables.               Contacts, dentures or bridgework may not be worn into surgery.  Leave suitcase in the car. After surgery it may be brought to your room.  For patients admitted to the hospital, discharge time is determined by your treatment team.               Patients discharged the day of surgery will not be allowed to drive home.  Name and phone number of your driver:   Special Instructions: Shower using CHG 2 nights before surgery and the night before surgery.  If you shower the day of surgery use CHG.  Use special wash - you have one bottle of CHG for all showers.  You should use approximately 1/3 of the bottle for each shower.   Please read over the following fact sheets that you were given: Surgical Site Infection Prevention and Anesthesia Post-op Instructions   PATIENT INSTRUCTIONS POST-ANESTHESIA  IMMEDIATELY FOLLOWING SURGERY:  Do not drive or operate machinery for the first twenty four hours after surgery.  Do not make any important decisions for twenty four hours after surgery or while taking narcotic pain medications or sedatives.  If you develop intractable nausea and vomiting or a severe headache please notify your doctor immediately.  FOLLOW-UP:  Please make an appointment with your surgeon as instructed. You do not need to follow up with anesthesia  unless specifically instructed to do so.  WOUND CARE INSTRUCTIONS (if applicable):  Keep a dry clean dressing on the anesthesia/puncture wound site if there is drainage.  Once the wound has quit draining you may leave it open to air.  Generally you should leave the bandage intact for twenty four hours unless there is drainage.  If the epidural site drains for more than 36-48 hours please call the anesthesia department.  QUESTIONS?:  Please feel free to call your physician or the hospital operator if you have any questions, and they will be happy to assist you.      Hernia A hernia happens when an organ inside your body pushes out through a weak spot in your belly (abdominal) wall. Most hernias get worse over time. They can often be pushed back into place (reduced). Surgery may be needed to repair hernias that cannot be pushed into place. HOME CARE  Keep doing normal activities.  Avoid lifting more than 10 pounds (4.5 kilograms).  Cough gently and avoid straining. Over time, these things will:  Increase your hernia size.  Irritate your hernia.  Break down hernia repairs.  Stop smoking.  Do not wear anything tight over your hernia. Do not keep the hernia in with an outside bandage.  Eat food that is high in fiber (fruit, vegetables, whole grains).  Drink enough  fluids to keep your pee (urine) clear or pale yellow.  Take medicines to make your poop soft (stool softeners) if you cannot poop (constipated). GET HELP RIGHT AWAY IF:   You have a fever.  You have belly pain that gets worse.  You feel sick to your stomach (nauseous) and throw up (vomit).  Your skin starts to bulge out.  Your hernia turns a different color, feels hard, or is tender.  You have increased pain or puffiness (swelling) around the hernia.  You poop more or less often.  Your poop does not look the way normally does.  You have watery poop (diarrhea).  You cannot push the hernia back in place by  applying gentle pressure while lying down. MAKE SURE YOU:   Understand these instructions.  Will watch your condition.  Will get help right away if you are not doing well or get worse. Document Released: 07/04/2009 Document Revised: 04/08/2011 Document Reviewed: 07/04/2009 [facility] Patient Information 2015 [place], [place]. This information is not intended to replace advice given to you by your health care provider. Make sure you discuss any questions you have with your health care provider.

## 2014-01-27 ENCOUNTER — Encounter (HOSPITAL_COMMUNITY): Payer: Self-pay

## 2014-01-27 ENCOUNTER — Encounter (HOSPITAL_COMMUNITY)
Admission: RE | Admit: 2014-01-27 | Discharge: 2014-01-27 | Disposition: A | Discharge status: Home / Self Care | Payer: Medicare Other | Source: Ambulatory Visit | Attending: General Surgery | Admitting: General Surgery

## 2014-01-27 DIAGNOSIS — I10 Essential (primary) hypertension: Secondary | ICD-10-CM | POA: Diagnosis not present

## 2014-01-27 DIAGNOSIS — Z7982 Long term (current) use of aspirin: Secondary | ICD-10-CM | POA: Insufficient documentation

## 2014-01-27 DIAGNOSIS — Z79899 Other long term (current) drug therapy: Secondary | ICD-10-CM | POA: Insufficient documentation

## 2014-01-27 DIAGNOSIS — I4891 Unspecified atrial fibrillation: Secondary | ICD-10-CM | POA: Diagnosis not present

## 2014-01-27 DIAGNOSIS — Z01812 Encounter for preprocedural laboratory examination: Secondary | ICD-10-CM | POA: Diagnosis not present

## 2014-01-27 DIAGNOSIS — K432 Incisional hernia without obstruction or gangrene: Secondary | ICD-10-CM | POA: Diagnosis not present

## 2014-01-27 HISTORY — DX: Other specified postprocedural states: R11.2

## 2014-01-27 HISTORY — DX: Benign prostatic hyperplasia without lower urinary tract symptoms: N40.0

## 2014-01-27 HISTORY — DX: Stress fracture, unspecified foot, initial encounter for fracture: M84.376A

## 2014-01-27 HISTORY — DX: Essential (primary) hypertension: I10

## 2014-01-27 HISTORY — DX: Gastro-esophageal reflux disease without esophagitis: K21.9

## 2014-01-27 HISTORY — DX: Other specified postprocedural states: Z98.890

## 2014-01-27 HISTORY — DX: Sleep apnea, unspecified: G47.30

## 2014-01-27 HISTORY — DX: Other specified soft tissue disorders: M79.89

## 2014-01-27 LAB — BASIC METABOLIC PANEL
Anion gap: 4 — ABNORMAL LOW (ref 5–15)
BUN: 11 mg/dL (ref 6–23)
CO2: 24 mmol/L (ref 19–32)
Calcium: 9.2 mg/dL (ref 8.4–10.5)
Chloride: 110 mEq/L (ref 96–112)
Creatinine, Ser: 0.9 mg/dL (ref 0.50–1.35)
GFR calc Af Amer: 87 mL/min — ABNORMAL LOW (ref >90)
GFR, EST NON AFRICAN AMERICAN: 75 mL/min — AB (ref >90)
GLUCOSE: 127 mg/dL — AB (ref 70–99)
POTASSIUM: 3.7 mmol/L (ref 3.5–5.1)
Sodium: 138 mmol/L (ref 135–145)

## 2014-01-27 LAB — CBC WITH DIFFERENTIAL/PLATELET
BASOS ABS: 0 10*3/uL (ref 0.0–0.1)
BASOS PCT: 0 % (ref 0–1)
Eosinophils Absolute: 0.2 10*3/uL (ref 0.0–0.7)
Eosinophils Relative: 3 % (ref 0–5)
HCT: 39.4 % (ref 39.0–52.0)
HEMOGLOBIN: 13.3 g/dL (ref 13.0–17.0)
Lymphocytes Relative: 37 % (ref 12–46)
Lymphs Abs: 2.5 10*3/uL (ref 0.7–4.0)
MCH: 31.1 pg (ref 26.0–34.0)
MCHC: 33.8 g/dL (ref 30.0–36.0)
MCV: 92.1 fL (ref 78.0–100.0)
MONOS PCT: 8 % (ref 3–12)
Monocytes Absolute: 0.5 10*3/uL (ref 0.1–1.0)
NEUTROS ABS: 3.5 10*3/uL (ref 1.7–7.7)
NEUTROS PCT: 52 % (ref 43–77)
PLATELETS: 155 10*3/uL (ref 150–400)
RBC: 4.28 MIL/uL (ref 4.22–5.81)
RDW: 12.6 % (ref 11.5–15.5)
WBC: 6.8 10*3/uL (ref 4.0–10.5)

## 2014-01-27 MED ORDER — CHLORHEXIDINE GLUCONATE 4 % EX LIQD: 1.0000 "application " | Freq: Once | CUTANEOUS | Status: DC

## 2014-01-31 ENCOUNTER — Encounter (HOSPITAL_COMMUNITY)
Admission: RE | Disposition: A | Discharge status: Home / Self Care | Payer: Self-pay | Source: Ambulatory Visit | Attending: General Surgery

## 2014-01-31 ENCOUNTER — Ambulatory Visit (HOSPITAL_COMMUNITY)
Admission: RE | Admit: 2014-01-31 | Discharge: 2014-01-31 | Disposition: A | Discharge status: Home / Self Care | Payer: Medicare Other | Source: Ambulatory Visit | Attending: General Surgery | Admitting: General Surgery

## 2014-01-31 ENCOUNTER — Encounter (HOSPITAL_COMMUNITY): Payer: Self-pay | Admitting: *Deleted

## 2014-01-31 ENCOUNTER — Ambulatory Visit (HOSPITAL_COMMUNITY): Payer: Medicare Other | Admitting: Anesthesiology

## 2014-01-31 DIAGNOSIS — K432 Incisional hernia without obstruction or gangrene: Secondary | ICD-10-CM | POA: Insufficient documentation

## 2014-01-31 DIAGNOSIS — L929 Granulomatous disorder of the skin and subcutaneous tissue, unspecified: Secondary | ICD-10-CM | POA: Diagnosis not present

## 2014-01-31 HISTORY — PX: WOUND DEBRIDEMENT: SHX247

## 2014-01-31 HISTORY — PX: INSERTION OF MESH: SHX5868

## 2014-01-31 HISTORY — PX: INCISIONAL HERNIA REPAIR: SHX193

## 2014-01-31 SURGERY — REPAIR, HERNIA, INCISIONAL
Anesthesia: General | Site: Leg Lower | Laterality: Right

## 2014-01-31 MED ORDER — SUCCINYLCHOLINE CHLORIDE 20 MG/ML IJ SOLN
INTRAMUSCULAR | Status: AC
  Filled 2014-01-31: qty 1

## 2014-01-31 MED ORDER — EPHEDRINE SULFATE 50 MG/ML IJ SOLN
INTRAMUSCULAR | Status: DC | PRN
  Administered 2014-01-31: 5 mg via INTRAVENOUS

## 2014-01-31 MED ORDER — EPHEDRINE SULFATE 50 MG/ML IJ SOLN
INTRAMUSCULAR | Status: AC
  Filled 2014-01-31: qty 1

## 2014-01-31 MED ORDER — FENTANYL CITRATE 0.05 MG/ML IJ SOLN
INTRAMUSCULAR | Status: DC | PRN
  Administered 2014-01-31 (×2): 50 ug via INTRAVENOUS

## 2014-01-31 MED ORDER — ONDANSETRON HCL 4 MG/2ML IJ SOLN
INTRAMUSCULAR | Status: AC
  Filled 2014-01-31: qty 2

## 2014-01-31 MED ORDER — CEFAZOLIN SODIUM-DEXTROSE 2-3 GM-% IV SOLR
2.0000 g | INTRAVENOUS | Status: AC
  Administered 2014-01-31: 2 g via INTRAVENOUS
  Filled 2014-01-31: qty 50

## 2014-01-31 MED ORDER — LIDOCAINE HCL (PF) 1 % IJ SOLN
INTRAMUSCULAR | Status: AC
  Filled 2014-01-31: qty 5

## 2014-01-31 MED ORDER — SODIUM CHLORIDE 0.9 % IJ SOLN
INTRAMUSCULAR | Status: AC
  Filled 2014-01-31: qty 30

## 2014-01-31 MED ORDER — PROPOFOL 10 MG/ML IV BOLUS
INTRAVENOUS | Status: DC | PRN
  Administered 2014-01-31: 50 mg via INTRAVENOUS

## 2014-01-31 MED ORDER — FENTANYL CITRATE 0.05 MG/ML IJ SOLN
25.0000 ug | INTRAMUSCULAR | Status: DC | PRN
  Administered 2014-01-31 (×2): 50 ug via INTRAVENOUS

## 2014-01-31 MED ORDER — DEXAMETHASONE SODIUM PHOSPHATE 4 MG/ML IJ SOLN
4.0000 mg | Freq: Once | INTRAMUSCULAR | Status: AC
  Administered 2014-01-31: 4 mg via INTRAVENOUS
  Filled 2014-01-31: qty 1

## 2014-01-31 MED ORDER — ONDANSETRON HCL 4 MG/2ML IJ SOLN
4.0000 mg | Freq: Once | INTRAMUSCULAR | Status: AC | PRN
  Administered 2014-01-31: 4 mg via INTRAVENOUS
  Filled 2014-01-31: qty 2

## 2014-01-31 MED ORDER — ARTIFICIAL TEARS OP OINT
TOPICAL_OINTMENT | OPHTHALMIC | Status: AC
  Filled 2014-01-31: qty 3.5

## 2014-01-31 MED ORDER — BUPIVACAINE LIPOSOME 1.3 % IJ SUSP
INTRAMUSCULAR | Status: AC
  Filled 2014-01-31: qty 20

## 2014-01-31 MED ORDER — KETOROLAC TROMETHAMINE 30 MG/ML IJ SOLN
30.0000 mg | Freq: Once | INTRAMUSCULAR | Status: AC
  Administered 2014-01-31: 30 mg via INTRAVENOUS
  Filled 2014-01-31: qty 1

## 2014-01-31 MED ORDER — ONDANSETRON HCL 4 MG/2ML IJ SOLN
4.0000 mg | Freq: Once | INTRAMUSCULAR | Status: AC
  Administered 2014-01-31: 4 mg via INTRAVENOUS

## 2014-01-31 MED ORDER — FENTANYL CITRATE 0.05 MG/ML IJ SOLN
INTRAMUSCULAR | Status: AC
  Filled 2014-01-31: qty 5

## 2014-01-31 MED ORDER — BUPIVACAINE HCL (PF) 0.5 % IJ SOLN
INTRAMUSCULAR | Status: AC
  Filled 2014-01-31: qty 30

## 2014-01-31 MED ORDER — GLYCOPYRROLATE 0.2 MG/ML IJ SOLN
INTRAMUSCULAR | Status: DC | PRN
  Administered 2014-01-31: 0.6 mg via INTRAVENOUS

## 2014-01-31 MED ORDER — PROPOFOL 10 MG/ML IV EMUL
INTRAVENOUS | Status: AC
  Filled 2014-01-31: qty 20

## 2014-01-31 MED ORDER — ROCURONIUM BROMIDE 50 MG/5ML IV SOLN
INTRAVENOUS | Status: AC
  Filled 2014-01-31: qty 1

## 2014-01-31 MED ORDER — LACTATED RINGERS IV SOLN
INTRAVENOUS | Status: DC
  Administered 2014-01-31: 07:00:00 via INTRAVENOUS

## 2014-01-31 MED ORDER — BUPIVACAINE HCL (PF) 0.5 % IJ SOLN
INTRAMUSCULAR | Status: DC | PRN
  Administered 2014-01-31: 10 mL
  Administered 2014-01-31: 1 mL

## 2014-01-31 MED ORDER — POVIDONE-IODINE 10 % OINT PACKET
TOPICAL_OINTMENT | CUTANEOUS | Status: DC | PRN
  Administered 2014-01-31: 1 via TOPICAL

## 2014-01-31 MED ORDER — POVIDONE-IODINE 10 % EX OINT
TOPICAL_OINTMENT | CUTANEOUS | Status: AC
  Filled 2014-01-31: qty 1

## 2014-01-31 MED ORDER — PHENYLEPHRINE HCL 10 MG/ML IJ SOLN
INTRAMUSCULAR | Status: AC
  Filled 2014-01-31: qty 1

## 2014-01-31 MED ORDER — ROCURONIUM BROMIDE 100 MG/10ML IV SOLN
INTRAVENOUS | Status: DC | PRN
Start: 2014-01-31 — End: 2014-01-31
  Administered 2014-01-31: 30 mg via INTRAVENOUS

## 2014-01-31 MED ORDER — MIDAZOLAM HCL 2 MG/2ML IJ SOLN
INTRAMUSCULAR | Status: AC
  Filled 2014-01-31: qty 2

## 2014-01-31 MED ORDER — TRAMADOL HCL 50 MG PO TABS: 50.0000 mg | ORAL_TABLET | Freq: Four times a day (QID) | ORAL | Status: DC | PRN

## 2014-01-31 MED ORDER — NEOSTIGMINE METHYLSULFATE 10 MG/10ML IV SOLN
INTRAVENOUS | Status: DC | PRN
  Administered 2014-01-31: 4 mg via INTRAVENOUS

## 2014-01-31 MED ORDER — FENTANYL CITRATE 0.05 MG/ML IJ SOLN
INTRAMUSCULAR | Status: AC
  Filled 2014-01-31: qty 2

## 2014-01-31 MED ORDER — SCOPOLAMINE 1 MG/3DAYS TD PT72
1.0000 | MEDICATED_PATCH | Freq: Once | TRANSDERMAL | Status: DC
  Administered 2014-01-31: 1.5 mg via TRANSDERMAL
  Filled 2014-01-31: qty 1

## 2014-01-31 MED ORDER — MIDAZOLAM HCL 2 MG/2ML IJ SOLN
1.0000 mg | INTRAMUSCULAR | Status: DC | PRN
  Administered 2014-01-31: 2 mg via INTRAVENOUS

## 2014-01-31 MED ORDER — SODIUM CHLORIDE 0.9 % IR SOLN
Status: DC | PRN
Start: 2014-01-31 — End: 2014-01-31
  Administered 2014-01-31: 1000 mL

## 2014-01-31 MED ORDER — LACTATED RINGERS IV SOLN
INTRAVENOUS | Status: DC | PRN
  Administered 2014-01-31: 07:00:00 via INTRAVENOUS

## 2014-01-31 MED ORDER — LIDOCAINE HCL (CARDIAC) 20 MG/ML IV SOLN
INTRAVENOUS | Status: DC | PRN
  Administered 2014-01-31: 25 mg via INTRAVENOUS

## 2014-01-31 SURGICAL SUPPLY — 52 items
BAG HAMPER (MISCELLANEOUS) ×4 IMPLANT
BNDG CONFORM 2 STRL LF (GAUZE/BANDAGES/DRESSINGS) IMPLANT
CHLORAPREP W/TINT 26ML (MISCELLANEOUS) ×4 IMPLANT
CLOTH BEACON ORANGE TIMEOUT ST (SAFETY) ×4 IMPLANT
COVER LIGHT HANDLE STERIS (MISCELLANEOUS) ×8 IMPLANT
DECANTER SPIKE VIAL GLASS SM (MISCELLANEOUS) IMPLANT
DRAPE EENT ADH APERT 31X51 STR (DRAPES) ×4 IMPLANT
DRSG TEGADERM 2-3/8X2-3/4 SM (GAUZE/BANDAGES/DRESSINGS) ×4 IMPLANT
DRSG XEROFORM 1X8 (GAUZE/BANDAGES/DRESSINGS) ×4 IMPLANT
ELECT REM PT RETURN 9FT ADLT (ELECTROSURGICAL) ×4
ELECTRODE REM PT RTRN 9FT ADLT (ELECTROSURGICAL) ×2 IMPLANT
FORMALIN 10 PREFIL 480ML (MISCELLANEOUS) ×4 IMPLANT
GAUZE SPONGE 4X4 12PLY STRL (GAUZE/BANDAGES/DRESSINGS) IMPLANT
GLOVE BIOGEL M STRL SZ7.5 (GLOVE) ×4 IMPLANT
GLOVE BIOGEL PI IND STRL 7.0 (GLOVE) ×2 IMPLANT
GLOVE BIOGEL PI IND STRL 7.5 (GLOVE) ×4 IMPLANT
GLOVE BIOGEL PI INDICATOR 7.0 (GLOVE) ×2
GLOVE BIOGEL PI INDICATOR 7.5 (GLOVE) ×4
GLOVE ECLIPSE 6.5 STRL STRAW (GLOVE) ×4 IMPLANT
GLOVE ECLIPSE 7.0 STRL STRAW (GLOVE) ×4 IMPLANT
GLOVE SURG SS PI 7.5 STRL IVOR (GLOVE) ×4 IMPLANT
GOWN STRL REUS W/ TWL XL LVL3 (GOWN DISPOSABLE) ×2 IMPLANT
GOWN STRL REUS W/TWL LRG LVL3 (GOWN DISPOSABLE) ×8 IMPLANT
GOWN STRL REUS W/TWL XL LVL3 (GOWN DISPOSABLE) ×4
INST SET MAJOR GENERAL (KITS) ×4 IMPLANT
KIT ROOM TURNOVER APOR (KITS) ×4 IMPLANT
MANIFOLD NEPTUNE II (INSTRUMENTS) ×4 IMPLANT
MESH VENTRALEX ST 1-7/10 CRC S (Mesh General) ×4 IMPLANT
NEEDLE HYPO 25X1 1.5 SAFETY (NEEDLE) ×4 IMPLANT
NS IRRIG 1000ML POUR BTL (IV SOLUTION) ×4 IMPLANT
PACK ABDOMINAL MAJOR (CUSTOM PROCEDURE TRAY) ×4 IMPLANT
PACK MINOR (CUSTOM PROCEDURE TRAY) IMPLANT
PAD ABD 5X9 TENDERSORB (GAUZE/BANDAGES/DRESSINGS) IMPLANT
PAD ARMBOARD 7.5X6 YLW CONV (MISCELLANEOUS) ×4 IMPLANT
SET BASIN LINEN APH (SET/KITS/TRAYS/PACK) ×4 IMPLANT
SPONGE GAUZE 4X4 12PLY (GAUZE/BANDAGES/DRESSINGS) ×4 IMPLANT
STAPLER VISISTAT (STAPLE) ×4 IMPLANT
SUT ETHIBOND NAB MO 7 #0 18IN (SUTURE) ×4 IMPLANT
SUT NOVA NAB GS-21 1 T12 (SUTURE) IMPLANT
SUT NOVA NAB GS-22 2 2-0 T-19 (SUTURE) IMPLANT
SUT NOVA NAB GS-26 0 60 (SUTURE) IMPLANT
SUT PROLENE 0 CT 1 CR/8 (SUTURE) IMPLANT
SUT SILK 2 0 (SUTURE)
SUT SILK 2-0 18XBRD TIE 12 (SUTURE) IMPLANT
SUT VIC AB 2-0 CT1 27 (SUTURE) ×4
SUT VIC AB 2-0 CT1 TAPERPNT 27 (SUTURE) ×2 IMPLANT
SUT VIC AB 3-0 SH 27 (SUTURE) ×3
SUT VIC AB 3-0 SH 27X BRD (SUTURE) ×2 IMPLANT
SUT VIC AB 4-0 PS2 27 (SUTURE) IMPLANT
SYR 20CC LL (SYRINGE) ×4 IMPLANT
SYR BULB IRRIGATION 50ML (SYRINGE) ×4 IMPLANT
TAPE CLOTH SURG 4X10 WHT LF (GAUZE/BANDAGES/DRESSINGS) ×4 IMPLANT

## 2014-01-31 NOTE — Op Note (Signed)
Patient:  [name]  DOB:  1926/12/24  MRN:  [id number]   Preop Diagnosis:  Incisional hernia, granuloma of right lower leg  Postop Diagnosis:  Same  Procedure:  Incisional herniorrhaphy with mesh, debridement of granuloma of right leg  Surgeon:  Aviva Signs, M.D.  Anes:  Gen. endotracheal  Indications:  Patient is an 81 year old white male status post laparoscopic cholecystectomy in the past 2 presents with an incisional hernia just superior to the umbilicus. He also has a granuloma from bumping his right leg in the past which has not fully healed.  The risks and benefits of the procedures including bleeding, infection, and the possibility of recurrence of the hernia were fully explained to the patient, who gave informed consent.  Procedure note:  The patient was placed the supine position. After induction of general endotracheal anesthesia, the abdomen was prepped and draped using the usual sterile technique with ChloraPrep. Surgical site confirmation was performed.  A supraumbilical incision was made through the previous surgical scar. Dissection was taken down to the fascia. The patient had omentum which was protruding through a 1 cm trocar site. Any adhesions were freed away and the omentum was reduced without difficulty. The ventral wall was then palpated circumferentially and no other hernia defects were noted. A 4.3 cm Bard Ventralex plug was then inserted and secured to the fascia using 0 Ethibond interrupted sutures. The overlying fascia was reapproximated using 0 Ethibond interrupted sutures. Subcutaneous layer was reapproximated using 3-0 Vicryl interrupted sutures. 0.5% Sensorcaine was instilled into the wound. The incision was closed using staples. Betadine ointment and a dry sterile dressing was applied.  Next, the 1.5 cm granuloma along the pretibial region of the right leg was prepped and draped using Betadine. Surgical site confirmation was performed. The granulomatous  tissue was excised sharply using Bovie electrocautery and a curette. A small portion of the granuloma did extend into the subcutaneous tissue. 0.5% Sensorcaine was instilled the surrounding wound. A Xeroform was then placed. A dry sterile dressing was then applied.  All tape and needle counts were correct after both procedures. The patient was extubated in the operating room and transferred to PACU in stable condition.  Complications:  None  EBL:  Minimal  Specimen:  None

## 2014-01-31 NOTE — Anesthesia Procedure Notes (Signed)
Procedure Name: Intubation Date/Time: 01/31/2014 7:40 AM Performed by: Andree Elk, AMY A Pre-anesthesia Checklist: Patient identified, Patient being monitored, Timeout performed, Emergency Drugs available and Suction available Patient Re-evaluated:Patient Re-evaluated prior to inductionOxygen Delivery Method: Circle System Utilized Preoxygenation: Pre-oxygenation with 100% oxygen Intubation Type: IV induction Ventilation: Mask ventilation without difficulty Laryngoscope Size: 3 and Miller Grade View: Grade I Tube type: Oral Tube size: 7.0 mm Number of attempts: 1 Airway Equipment and Method: stylet Placement Confirmation: ETT inserted through vocal cords under direct vision,  positive ETCO2 and breath sounds checked- equal and bilateral Secured at: 21 cm Tube secured with: Tape Dental Injury: Teeth and Oropharynx as per pre-operative assessment

## 2014-01-31 NOTE — Discharge Instructions (Signed)
Open Hernia Repair, Care After °Refer to this sheet in the next few weeks. These instructions provide you with information on caring for yourself after your procedure. Your health care provider may also give you more specific instructions. Your treatment has been planned according to current medical practices, but problems sometimes occur. Call your health care provider if you have any problems or questions after your procedure. °WHAT TO EXPECT AFTER THE PROCEDURE °After your procedure, it is typical to have the following: °· Pain in your abdomen, especially along your incision. You will be given pain medicines to control the pain. °· Constipation. You may be given a stool softener to help prevent this. °HOME CARE INSTRUCTIONS  °· Only take over-the-counter or prescription medicines as directed by your health care provider. °· Keep the wound dry and clean. You may wash the wound gently with soap and water 48 hours after surgery. Gently blot or dab the wound dry. Do not take baths, use swimming pools, or use hot tubs for 10 days or until your health care provider approves. °· Change bandages (dressings) as directed by your health care provider. °· Continue your normal diet as directed by your health care provider. Eat plenty of fruits and vegetables to help prevent constipation. °· Drink enough fluids to keep your urine clear or pale yellow. This also helps prevent constipation. °· Do not drive until your health care provider says it is okay. °· Do not lift anything heavier than 10 pounds (4.5 kg) or play contact sports for 4 weeks or until your health care provider approves. °· Follow up with your health care provider as directed. Ask your health care provider when to make an appointment to have your stitches (sutures) or staples removed. °SEEK MEDICAL CARE IF:  °· You have increased bleeding coming from the incision site. °· You have blood in your stool. °· You have increasing pain in the wound. °· You see redness  or swelling in the wound. °· You have fluid (pus) coming from the wound. °· You have a fever. °· You notice a bad smell coming from the wound or dressing. °SEEK IMMEDIATE MEDICAL CARE IF:  °· You develop a rash. °· You have chest pain or shortness of breath. °· You feel lightheaded or feel faint. °Document Released: 08/03/2004 Document Revised: 11/04/2012 Document Reviewed: 08/26/2012 °ExitCare® Patient Information ©2015 [employer]. This information is not intended to replace advice given to you by your health care provider. Make sure you discuss any questions you have with your health care provider. °Ventral Hernia °A ventral hernia (also called an incisional hernia) is a hernia that occurs at the site of a previous surgical cut (incision) in the abdomen. The abdominal wall spans from your lower chest down to your pelvis. If the abdominal wall is weakened from a surgical incision, a hernia can occur. A hernia is a bulge of bowel or muscle tissue pushing out on the weakened part of the abdominal wall. Ventral hernias can get bigger from straining or lifting. °Obese and older people are at higher risk for a ventral hernia. People who develop infections after surgery or require repeat incisions at the same site on the abdomen are also at increased risk. °CAUSES  °A ventral hernia occurs because of weakness in the abdominal wall at an incision site.  °SYMPTOMS  °Common symptoms include: °· A visible bulge or lump on the abdominal wall. °· Pain or tenderness around the lump. °· Increased discomfort if you cough or make a   sudden movement. °If the hernia has blocked part of the intestine, a serious complication can occur (incarcerated or strangulated hernia). This can become a problem that requires emergency surgery because the blood flow to the blocked intestine may be cut off. Symptoms may include: °· Feeling sick to your stomach (nauseous). °· Throwing up (vomiting). °· Stomach swelling (distention) or  bloating. °· Fever. °· Rapid heartbeat. °DIAGNOSIS  °Your health care provider will take a medical history and perform a physical exam. Various tests may be ordered, such as: °· Blood tests. °· Urine tests. °· Ultrasonography. °· X-rays. °· Computed tomography (CT). °TREATMENT  °Watchful waiting may be all that is needed for a smaller hernia that does not cause symptoms. Your health care provider may recommend the use of a supportive belt (truss) that helps to keep the abdominal wall intact. For larger hernias or those that cause pain, surgery to repair the hernia is usually recommended. If a hernia becomes strangulated, emergency surgery needs to be done right away. °HOME CARE INSTRUCTIONS °· Avoid putting pressure or strain on the abdominal area. °· Avoid heavy lifting. °· Use good body positioning for physical tasks. Ask your health care provider about proper body positioning. °· Use a supportive belt as directed by your health care provider. °· Maintain a healthy weight. °· Eat foods that are high in fiber, such as whole grains, fruits, and vegetables. Fiber helps prevent difficult bowel movements (constipation). °· Drink enough fluids to keep your urine clear or pale yellow. °· Follow up with your health care provider as directed. °SEEK MEDICAL CARE IF:  °· Your hernia seems to be getting larger or more painful. °SEEK IMMEDIATE MEDICAL CARE IF:  °· You have abdominal pain that is sudden and sharp. °· Your pain becomes severe. °· You have repeated vomiting. °· You are sweating a lot. °· You notice a rapid heartbeat. °· You develop a fever. °MAKE SURE YOU:  °· Understand these instructions. °· Will watch your condition. °· Will get help right away if you are not doing well or get worse. °Document Released: 01/01/2012 Document Revised: 05/31/2013 Document Reviewed: 01/01/2012 °ExitCare® Patient Information ©2015 [employer]. This information is not intended to replace advice given to you by your health care  provider. Make sure you discuss any questions you have with your health care provider. ° °

## 2014-01-31 NOTE — Transfer of Care (Signed)
Immediate Anesthesia Transfer of Care Note  Patient: [name]  Procedure(s) Performed: Procedure(s): INCISIONAL HERNIORRHAPHY WITH MESH (N/A) DEBRIDEMENT WOUND RIGHT LEG (Right) INSERTION OF MESH (N/A)  Patient Location: PACU  Anesthesia Type:General  Level of Consciousness: sedated and patient cooperative  Airway & Oxygen Therapy: Patient Spontanous Breathing and Patient connected to face mask oxygen  Post-op Assessment: Report given to PACU RN and Post -op Vital signs reviewed and stable  Post vital signs: Reviewed and stable  Complications: No apparent anesthesia complications

## 2014-01-31 NOTE — Anesthesia Preprocedure Evaluation (Addendum)
Anesthesia Evaluation  Patient identified by MRN, date of birth, ID band Patient awake    Reviewed: Allergy & Precautions, H&P , NPO status , Patient's Chart, lab work & pertinent test results  History of Anesthesia Complications (+) PONV and history of anesthetic complications  Airway Mallampati: II  TM Distance: >3 FB     Dental  (+) Edentulous Upper, Edentulous Lower   Pulmonary sleep apnea and Continuous Positive Airway Pressure Ventilation , former smoker,  breath sounds clear to auscultation        Cardiovascular hypertension, Pt. on medications + Peripheral Vascular Disease + dysrhythmias Atrial Fibrillation Rhythm:Regular Rate:Normal     Neuro/Psych    GI/Hepatic GERD-  Medicated and Controlled,  Endo/Other    Renal/GU      Musculoskeletal   Abdominal   Peds  Hematology   Anesthesia Other Findings   Reproductive/Obstetrics                            Anesthesia Physical Anesthesia Plan  ASA: III  Anesthesia Plan: General   Post-op Pain Management:    Induction: Intravenous  Airway Management Planned: Oral ETT  Additional Equipment:   Intra-op Plan:   Post-operative Plan: Extubation in OR  Informed Consent: I have reviewed the patients History and Physical, chart, labs and discussed the procedure including the risks, benefits and alternatives for the proposed anesthesia with the patient or authorized representative who has indicated his/her understanding and acceptance.     Plan Discussed with:   Anesthesia Plan Comments:         Anesthesia Quick Evaluation

## 2014-01-31 NOTE — Interval H&P Note (Signed)
History and Physical Interval Note:  01/31/2014 7:19 AM  [name]  has presented today for surgery, with the diagnosis of incisional hernia, right leg wound  The various methods of treatment have been discussed with the patient and family. After consideration of risks, benefits and other options for treatment, the patient has consented to  Procedure(s): HERNIA REPAIR INCISIONAL WITH MESH (N/A) DEBRIDEMENT WOUND RIGHT LEG (Right) as a surgical intervention .  The patient's history has been reviewed, patient examined, no change in status, stable for surgery.  I have reviewed the patient's chart and labs.  Questions were answered to the patient's satisfaction.     Aviva Signs A

## 2014-01-31 NOTE — Anesthesia Postprocedure Evaluation (Signed)
  Anesthesia Post-op Note Late Entry  Patient: [name]  Procedure(s) Performed: Procedure(s): INCISIONAL HERNIORRHAPHY WITH MESH (N/A) DEBRIDEMENT WOUND RIGHT LEG (Right) INSERTION OF MESH (N/A)  Patient Location: PACU  Anesthesia Type:General  Level of Consciousness: awake, alert  and oriented  Airway and Oxygen Therapy: Patient Spontanous Breathing  Post-op Pain: none  Post-op Assessment: Post-op Vital signs reviewed, Patient's Cardiovascular Status Stable, Respiratory Function Stable, Patent Airway and No signs of Nausea or vomiting  Post-op Vital Signs: Reviewed and stable  Last Vitals:  Filed Vitals:   01/31/14 0932  BP: 153/83  Pulse: 53  Temp: 36.8 C  Resp: 16    Complications: No apparent anesthesia complications

## 2014-02-01 ENCOUNTER — Encounter (HOSPITAL_COMMUNITY): Payer: Self-pay | Admitting: General Surgery

## 2014-02-03 DIAGNOSIS — R300 Dysuria: Secondary | ICD-10-CM | POA: Diagnosis not present

## 2014-02-04 ENCOUNTER — Ambulatory Visit (INDEPENDENT_AMBULATORY_CARE_PROVIDER_SITE_OTHER): Payer: Medicare Other | Admitting: Urology

## 2014-02-04 DIAGNOSIS — N401 Enlarged prostate with lower urinary tract symptoms: Secondary | ICD-10-CM | POA: Diagnosis not present

## 2014-02-04 DIAGNOSIS — R339 Retention of urine, unspecified: Secondary | ICD-10-CM

## 2014-02-09 DIAGNOSIS — Z85828 Personal history of other malignant neoplasm of skin: Secondary | ICD-10-CM | POA: Diagnosis not present

## 2014-02-11 ENCOUNTER — Ambulatory Visit (INDEPENDENT_AMBULATORY_CARE_PROVIDER_SITE_OTHER): Payer: Medicare Other | Admitting: Urology

## 2014-02-11 DIAGNOSIS — R339 Retention of urine, unspecified: Secondary | ICD-10-CM | POA: Diagnosis not present

## 2014-02-11 DIAGNOSIS — N3000 Acute cystitis without hematuria: Secondary | ICD-10-CM | POA: Diagnosis not present

## 2014-02-25 DIAGNOSIS — H2513 Age-related nuclear cataract, bilateral: Secondary | ICD-10-CM | POA: Diagnosis not present

## 2014-02-25 DIAGNOSIS — H4011X3 Primary open-angle glaucoma, severe stage: Secondary | ICD-10-CM | POA: Diagnosis not present

## 2014-03-09 ENCOUNTER — Encounter: Payer: Self-pay | Admitting: Cardiovascular Disease

## 2014-03-09 ENCOUNTER — Ambulatory Visit (INDEPENDENT_AMBULATORY_CARE_PROVIDER_SITE_OTHER): Payer: Medicare Other | Admitting: Cardiovascular Disease

## 2014-03-09 VITALS — BP 132/70 | HR 53 | Ht 68.0 in | Wt 191.0 lb

## 2014-03-09 DIAGNOSIS — R609 Edema, unspecified: Secondary | ICD-10-CM

## 2014-03-09 DIAGNOSIS — I714 Abdominal aortic aneurysm, without rupture, unspecified: Secondary | ICD-10-CM

## 2014-03-09 DIAGNOSIS — I4891 Unspecified atrial fibrillation: Secondary | ICD-10-CM | POA: Diagnosis not present

## 2014-03-09 DIAGNOSIS — R001 Bradycardia, unspecified: Secondary | ICD-10-CM | POA: Diagnosis not present

## 2014-03-09 DIAGNOSIS — I2510 Atherosclerotic heart disease of native coronary artery without angina pectoris: Secondary | ICD-10-CM | POA: Diagnosis not present

## 2014-03-09 DIAGNOSIS — R600 Localized edema: Secondary | ICD-10-CM

## 2014-03-09 DIAGNOSIS — G4730 Sleep apnea, unspecified: Secondary | ICD-10-CM | POA: Diagnosis not present

## 2014-03-09 DIAGNOSIS — I10 Essential (primary) hypertension: Secondary | ICD-10-CM | POA: Diagnosis not present

## 2014-03-09 NOTE — Patient Instructions (Signed)
Continue all current medications. Your physician wants you to follow up in: 6 months.  You will receive a reminder letter in the mail one-two months in advance.  If you don't receive a letter, please call our office to schedule the follow up appointment   

## 2014-03-09 NOTE — Progress Notes (Signed)
Patient ID: [name], male   DOB: [birth year], 81 y.o.   MRN: [id number]      SUBJECTIVE: [name] is doing well. He successfully underwent hernia surgery. He denies dizziness, chest pain, shortness of breath, and syncope. He occasionally has some mild leg swelling bilaterally.   Review of Systems: As per "subjective", otherwise negative.  Allergies  Allergen Reactions  . Flexeril [Cyclobenzaprine Hcl] Nausea And Vomiting  . Percocet [Oxycodone-Acetaminophen] Nausea And Vomiting  . Sulfonamide Derivatives     REACTION: RASH  . Vicodin [Hydrocodone-Acetaminophen] Nausea And Vomiting    Current Outpatient Prescriptions  Medication Sig Dispense Refill  . acetaminophen (TYLENOL) 325 MG tablet Take 650 mg by mouth every 6 (six) hours as needed for mild pain or headache.     Marland Kitchen amLODipine (NORVASC) 5 MG tablet Take 1 tablet (5 mg total) by mouth daily. 30 tablet 6  . aspirin 81 MG EC tablet Take 81 mg by mouth daily.      . Cholecalciferol (VITAMIN D) 400 UNITS capsule Take 800 Units by mouth daily.      . dorzolamide (TRUSOPT) 2 % ophthalmic solution Place 1 drop into the right eye 2 (two) times daily.     Marland Kitchen esomeprazole (NEXIUM) 20 MG capsule Take 20 mg by mouth daily.    . finasteride (PROSCAR) 5 MG tablet Take 5 mg by mouth daily.      . furosemide (LASIX) 20 MG tablet Take 1 tablet (20 mg total) by mouth as needed. Twice weekly (Patient taking differently: Take 20 mg by mouth See admin instructions. Takes twice weekly as needed for fluid.) 30 tablet 6  . latanoprost (XALATAN) 0.005 % ophthalmic solution Place 1 drop into both eyes at bedtime.     . Omega-3 Fatty Acids (FISH OIL) 1000 MG CAPS Take 2 capsules by mouth daily.     Marland Kitchen UNABLE TO FIND as directed. On CPAP     No current facility-administered medications for this visit.    Past Medical History  Diagnosis Date  . Orthostatic hypotension   . Atrial fibrillation     bicarbonate perfusion study 10/11 EF 69%. small  defects no ischemia. 7 metastases acheived. ER visit 11/10/09 ruled out MI normal sinus rhythm orthostasis after sublingual nitroglycerin   . AAA (abdominal aortic aneurysm)   . Hypertension   . Stress fracture of foot     left  . GERD (gastroesophageal reflux disease)   . BPH (benign prostatic hyperplasia)   . Swelling of both lower extremities   . Sleep apnea     CPAP at night  . PONV (postoperative nausea and vomiting)     Past Surgical History  Procedure Laterality Date  . Shoulder surgery Right 2005?    for open rotator cuff repair. [place]  . Wrist fracture surgery Right   . Colonoscopy N/A 08/27/2012    Procedure: COLONOSCOPY;  Surgeon: Rogene Houston, MD;  Location: [facility];  Service: Endoscopy;  Laterality: N/A;  325  . Cholecystectomy  2012    [facility] by Dr. Geroge Baseman  . Hernia repair Bilateral     Inguinal and umbilical  . Incisional hernia repair N/A 01/31/2014    Procedure: [name] HERNIORRHAPHY WITH MESH;  Surgeon: Jamesetta So, MD;  Location: [facility] ORS;  Service: General;  Laterality: N/A;  . Wound debridement Right 01/31/2014    Procedure: DEBRIDEMENT WOUND RIGHT LEG;  Surgeon: Jamesetta So, MD;  Location: [facility] ORS;  Service:  General;  Laterality: Right;  . Insertion of mesh N/A 01/31/2014    Procedure: INSERTION OF MESH;  Surgeon: Jamesetta So, MD;  Location: [facility] OR[facility];  Service: General;  Laterality: N/A;    History   Social History  . Marital Status: Divorced    Spouse Name: N/A  . Number of Children: N/A  . Years of Education: N/A   Occupational History  . Not on file.   Social History Main Topics  . Smoking status: Former Smoker -- 1.00 packs/day for 10 years    Types: Cigarettes    Start date: 10/15/1943    Quit date: 01/28/1954  . Smokeless tobacco: Never Used     Comment: tobacco use - no  . Alcohol Use: No  . Drug Use: No  . Sexual Activity: Not on file   Other Topics Concern  . Not on file   Social History  Narrative     Filed Vitals:   03/09/14 0826  BP: 132/70  Pulse: 53  Height: 5\' 8"  (1.727 m)  Weight: 191 lb (86.637 kg)  SpO2: 97%    PHYSICAL EXAM General: NAD HEENT: Normal. Neck: No JVD, no thyromegaly. Lungs: Clear to auscultation bilaterally with normal respiratory effort. CV: Nondisplaced PMI.  Bradycardic, regular rhythm, normal S1/S2, no S3/S4, no murmur. Trivial pretibial and periankle edema.  No carotid bruit.  Abdomen: Soft, nontender, no distention.  Neurologic: Alert and oriented x 3.  Psych: Normal affect. Skin: Normal. Musculoskeletal: Normal range of motion, no gross deformities. Extremities: No clubbing or cyanosis.   ECG: Most recent ECG reviewed.      ASSESSMENT AND PLAN:  Coronary artery disease Asymptomatic. Will continue to pursue a heart healthy lifestyle and continue ASA and fish oil.  Abdominal aneurysm Follows with Dr. Scot Dock of [facility]. Deemed to be stable.   Paroxysmal atrial fibrillation He is mildly bradycardic today. He very seldom has palpitations. No clear evidence of symptomatic bradycardia. No anticoagulation at this time as apparently isolated instance.  Edema He can take extra Lasix as needed when he has edema.   Dizziness and Fatigue, Shortness of Breath / Symptomatic Bradycardia  No clear evidence of symptomatic bradycardia. Continue to monitor.   Sleep apnea Diagnosed with sleep apnea, and regularly using CPAP. This has likely been contributing to bradycardia, hypertension, and prior morning headaches.  Essential hypertension Well controlled on amlodipine 5 mg daily.   Dispo: f/u 6 months.   Kate Sable, M.D., F.A.C.C.

## 2014-03-11 ENCOUNTER — Ambulatory Visit (INDEPENDENT_AMBULATORY_CARE_PROVIDER_SITE_OTHER): Payer: Medicare Other | Admitting: Urology

## 2014-03-11 DIAGNOSIS — N401 Enlarged prostate with lower urinary tract symptoms: Secondary | ICD-10-CM | POA: Diagnosis not present

## 2014-03-11 DIAGNOSIS — R339 Retention of urine, unspecified: Secondary | ICD-10-CM | POA: Diagnosis not present

## 2014-03-11 DIAGNOSIS — N3000 Acute cystitis without hematuria: Secondary | ICD-10-CM

## 2014-04-21 DIAGNOSIS — H4011X3 Primary open-angle glaucoma, severe stage: Secondary | ICD-10-CM | POA: Diagnosis not present

## 2014-05-03 ENCOUNTER — Encounter: Payer: Self-pay | Admitting: Vascular Surgery

## 2014-05-03 DIAGNOSIS — I10 Essential (primary) hypertension: Secondary | ICD-10-CM | POA: Diagnosis not present

## 2014-05-03 DIAGNOSIS — E871 Hypo-osmolality and hyponatremia: Secondary | ICD-10-CM | POA: Diagnosis not present

## 2014-05-03 DIAGNOSIS — K219 Gastro-esophageal reflux disease without esophagitis: Secondary | ICD-10-CM | POA: Diagnosis not present

## 2014-05-03 DIAGNOSIS — E782 Mixed hyperlipidemia: Secondary | ICD-10-CM | POA: Diagnosis not present

## 2014-05-03 DIAGNOSIS — I480 Paroxysmal atrial fibrillation: Secondary | ICD-10-CM | POA: Diagnosis not present

## 2014-05-04 ENCOUNTER — Ambulatory Visit (INDEPENDENT_AMBULATORY_CARE_PROVIDER_SITE_OTHER): Payer: Medicare Other | Admitting: Vascular Surgery

## 2014-05-04 ENCOUNTER — Ambulatory Visit (HOSPITAL_COMMUNITY)
Admission: RE | Admit: 2014-05-04 | Discharge: 2014-05-04 | Disposition: A | Discharge status: Home / Self Care | Payer: Medicare Other | Source: Ambulatory Visit | Attending: Vascular Surgery | Admitting: Vascular Surgery

## 2014-05-04 ENCOUNTER — Encounter: Payer: Self-pay | Admitting: Vascular Surgery

## 2014-05-04 VITALS — BP 156/89 | HR 52 | Ht 68.0 in | Wt 186.0 lb

## 2014-05-04 DIAGNOSIS — I2510 Atherosclerotic heart disease of native coronary artery without angina pectoris: Secondary | ICD-10-CM | POA: Diagnosis not present

## 2014-05-04 DIAGNOSIS — I714 Abdominal aortic aneurysm, without rupture, unspecified: Secondary | ICD-10-CM

## 2014-05-04 NOTE — Progress Notes (Signed)
Filed Vitals:   05/04/14 0917 05/04/14 0920  BP: 143/86 156/89  Pulse: 50 52  Height: 5\' 8"  (1.727 m)   Weight: 186 lb (84.369 kg)   Body mass index is 28.29 kg/(m^2).

## 2014-05-04 NOTE — Progress Notes (Signed)
[facility] [place]  Patient name: [name] MRN: [id number] DOB: [birth year] Sex: male  REASON FOR VISIT: Follow up of abdominal aortic aneurysm  HPI: [name] is a 81 y.o. male who I last saw on 04/28/2013. I have been following him with an abdominal aortic aneurysm. At the time of this visit the maximum diameter of his aneurysm was 4.5 cm and had not changed in 9 months. He maximum diameter of the right common iliac artery was 1.2 cm. The maximum diameter of the left common iliac was 1.98 cm.  Since I saw him last he has had no significant changes in his medical history except that he has had a abdominal hernia repaired. He does have some chronic low back pain but no new onset back pain and no abdominal pain.  His only other complaint is some leg swelling.  He is not a smoker.    Past Medical History  Diagnosis Date  . Orthostatic hypotension   . Atrial fibrillation     bicarbonate perfusion study 10/11 EF 69%. small defects no ischemia. 7 metastases acheived. ER visit 11/10/09 ruled out MI normal sinus rhythm orthostasis after sublingual nitroglycerin   . AAA (abdominal aortic aneurysm)   . Hypertension   . Stress fracture of foot     left  . GERD (gastroesophageal reflux disease)   . BPH (benign prostatic hyperplasia)   . Swelling of both lower extremities   . Sleep apnea     CPAP at night  . PONV (postoperative nausea and vomiting)    No family history on file. SOCIAL HISTORY: History  Substance Use Topics  . Smoking status: Former Smoker -- 1.00 packs/day for 10 years    Types: Cigarettes    Start date: 10/15/1943    Quit date: 01/28/1954  . Smokeless tobacco: Never Used     Comment: tobacco use - no  . Alcohol Use: No   Allergies  Allergen Reactions  . Flexeril [Cyclobenzaprine Hcl] Nausea And Vomiting  . Percocet [Oxycodone-Acetaminophen] Nausea And Vomiting  . Sulfonamide Derivatives     REACTION: RASH  . Vicodin  [Hydrocodone-Acetaminophen] Nausea And Vomiting   Current Outpatient Prescriptions  Medication Sig Dispense Refill  . acetaminophen (TYLENOL) 325 MG tablet Take 650 mg by mouth every 6 (six) hours as needed for mild pain or headache.     Marland Kitchen amLODipine (NORVASC) 5 MG tablet Take 1 tablet (5 mg total) by mouth daily. 30 tablet 6  . aspirin 81 MG EC tablet Take 81 mg by mouth daily.      . Cholecalciferol (VITAMIN D) 400 UNITS capsule Take 800 Units by mouth daily.      . dorzolamide (TRUSOPT) 2 % ophthalmic solution Place 1 drop into the right eye 2 (two) times daily.     Marland Kitchen esomeprazole (NEXIUM) 20 MG capsule Take 20 mg by mouth daily.    . finasteride (PROSCAR) 5 MG tablet Take 5 mg by mouth daily.      . furosemide (LASIX) 20 MG tablet Take 1 tablet (20 mg total) by mouth as needed. Twice weekly (Patient taking differently: Take 20 mg by mouth See admin instructions. Takes twice weekly as needed for fluid.) 30 tablet 6  . latanoprost (XALATAN) 0.005 % ophthalmic solution Place 1 drop into both eyes at bedtime.     . Omega-3 Fatty Acids (FISH OIL) 1000 MG CAPS Take 2 capsules by mouth daily.     Marland Kitchen UNABLE TO FIND as  directed. On CPAP     No current facility-administered medications for this visit.   REVIEW OF SYSTEMS: Valu.Nieves ] denotes positive finding; [  ] denotes negative finding  CARDIOVASCULAR:  [ ]  chest pain   [ ]  chest pressure   [ ]  palpitations   [ ]  orthopnea   [ ]  dyspnea on exertion   [ ]  claudication   [ ]  rest pain   [ ]  DVT   [ ]  phlebitis PULMONARY:   [ ]  productive cough   [ ]  asthma   [ ]  wheezing NEUROLOGIC:   [ ]  weakness  [ ]  paresthesias  [ ]  aphasia  [ ]  amaurosis  [ ]  dizziness HEMATOLOGIC:   [ ]  bleeding problems   [ ]  clotting disorders MUSCULOSKELETAL:  [ ]  joint pain   [ ]  joint swelling Valu.Nieves ] leg swelling GASTROINTESTINAL: [ ]   blood in stool  [ ]   hematemesis GENITOURINARY:  [ ]   dysuria  [ ]   hematuria PSYCHIATRIC:  [ ]  history of major depression INTEGUMENTARY:  [  ] rashes  [ ]  ulcers CONSTITUTIONAL:  [ ]  fever   [ ]  chills  PHYSICAL EXAM: Filed Vitals:   05/04/14 0917 05/04/14 0920  BP: 143/86 156/89  Pulse: 50 52  Height: 5\' 8"  (1.727 m)   Weight: 186 lb (84.369 kg)    GENERAL: The patient is a well-nourished male, in no acute distress. The vital signs are documented above. CARDIOVASCULAR: There is a regular rate and rhythm. I do not detect carotid bruits. He has palpable femoral pulses and a palpable left dorsalis pedis pulse. I cannot palpate a right dorsalis pedis pulse. He has moderate bilateral lower extremity swelling makes it difficult to palpate his posterior tibial pulses. Both feet are warm and well-perfused. PULMONARY: There is good air exchange bilaterally without wheezing or rales. ABDOMEN: Soft and non-tender with normal pitched bowel sounds. Because of his size I cannot palpate his aneurysm. MUSCULOSKELETAL: There are no major deformities or cyanosis. NEUROLOGIC: No focal weakness or paresthesias are detected. SKIN: There are no ulcers or rashes noted. He does have some hyperpigmentation bilaterally consistent with chronic venous insufficiency. PSYCHIATRIC: The patient has a normal affect.  DATA:  DUPLEX ABDOMINAL AORTA: I have independently interpreted his duplex today which shows that the maximum diameter of his infrarenal abdominal aortic aneurysm is 4.4 cm. This has not changed compared to the study one year ago. The maximum diameter of the right common iliac artery is 1.3 cm. The maximum diameter of the left common iliac artery is 1.4 cm.  MEDICAL ISSUES: ABDOMINAL AORTIC ANEURYSM: His aneurysm has not changed in size over the last year. He understands we would not consider elective repair unless the aneurysm reached 5.5 cm in a normal risk patient. Given his age I would not consider him normal risk. Regardless this been no change in his aneurysm. He is not a smoker. His blood pressure has been under good control although it was  mildly elevated today. I have ordered a follow up duplex scan in 1 year and I will see him back at that time. He knows to call sooner if he has problems.  Return in about 1 year (around 05/04/2015).   [facility] [place] Beeper: [phone]

## 2014-05-04 NOTE — Addendum Note (Signed)
Addended by: Dorthula Rue L on: 05/04/2014 11:10 AM   Modules accepted: Orders

## 2014-05-10 DIAGNOSIS — I480 Paroxysmal atrial fibrillation: Secondary | ICD-10-CM | POA: Diagnosis not present

## 2014-05-10 DIAGNOSIS — I10 Essential (primary) hypertension: Secondary | ICD-10-CM | POA: Diagnosis not present

## 2014-05-10 DIAGNOSIS — Z1389 Encounter for screening for other disorder: Secondary | ICD-10-CM | POA: Diagnosis not present

## 2014-05-10 DIAGNOSIS — K458 Other specified abdominal hernia without obstruction or gangrene: Secondary | ICD-10-CM | POA: Diagnosis not present

## 2014-05-10 DIAGNOSIS — I714 Abdominal aortic aneurysm, without rupture: Secondary | ICD-10-CM | POA: Diagnosis not present

## 2014-05-10 DIAGNOSIS — M810 Age-related osteoporosis without current pathological fracture: Secondary | ICD-10-CM | POA: Diagnosis not present

## 2014-05-10 DIAGNOSIS — E782 Mixed hyperlipidemia: Secondary | ICD-10-CM | POA: Diagnosis not present

## 2014-05-10 DIAGNOSIS — G4733 Obstructive sleep apnea (adult) (pediatric): Secondary | ICD-10-CM | POA: Diagnosis not present

## 2014-05-10 DIAGNOSIS — N400 Enlarged prostate without lower urinary tract symptoms: Secondary | ICD-10-CM | POA: Diagnosis not present

## 2014-05-10 DIAGNOSIS — K219 Gastro-esophageal reflux disease without esophagitis: Secondary | ICD-10-CM | POA: Diagnosis not present

## 2014-05-20 ENCOUNTER — Ambulatory Visit (INDEPENDENT_AMBULATORY_CARE_PROVIDER_SITE_OTHER): Payer: Medicare Other | Admitting: Urology

## 2014-05-20 DIAGNOSIS — R339 Retention of urine, unspecified: Secondary | ICD-10-CM | POA: Diagnosis not present

## 2014-05-20 DIAGNOSIS — N401 Enlarged prostate with lower urinary tract symptoms: Secondary | ICD-10-CM

## 2014-05-20 DIAGNOSIS — N3000 Acute cystitis without hematuria: Secondary | ICD-10-CM

## 2014-06-17 DIAGNOSIS — I480 Paroxysmal atrial fibrillation: Secondary | ICD-10-CM | POA: Diagnosis not present

## 2014-06-17 DIAGNOSIS — I10 Essential (primary) hypertension: Secondary | ICD-10-CM | POA: Diagnosis not present

## 2014-06-17 DIAGNOSIS — M7551 Bursitis of right shoulder: Secondary | ICD-10-CM | POA: Diagnosis not present

## 2014-06-17 DIAGNOSIS — M19011 Primary osteoarthritis, right shoulder: Secondary | ICD-10-CM | POA: Diagnosis not present

## 2014-07-18 DIAGNOSIS — B029 Zoster without complications: Secondary | ICD-10-CM | POA: Diagnosis not present

## 2014-07-27 ENCOUNTER — Other Ambulatory Visit: Payer: Self-pay | Admitting: Cardiovascular Disease

## 2014-08-09 ENCOUNTER — Encounter: Payer: Self-pay | Admitting: Cardiovascular Disease

## 2014-08-09 ENCOUNTER — Ambulatory Visit (INDEPENDENT_AMBULATORY_CARE_PROVIDER_SITE_OTHER): Payer: Medicare Other | Admitting: Cardiovascular Disease

## 2014-08-09 VITALS — BP 122/74 | HR 75 | Ht 68.0 in | Wt 190.6 lb

## 2014-08-09 DIAGNOSIS — I2510 Atherosclerotic heart disease of native coronary artery without angina pectoris: Secondary | ICD-10-CM | POA: Diagnosis not present

## 2014-08-09 DIAGNOSIS — I714 Abdominal aortic aneurysm, without rupture, unspecified: Secondary | ICD-10-CM

## 2014-08-09 DIAGNOSIS — R55 Syncope and collapse: Secondary | ICD-10-CM | POA: Diagnosis not present

## 2014-08-09 DIAGNOSIS — R42 Dizziness and giddiness: Secondary | ICD-10-CM

## 2014-08-09 DIAGNOSIS — R5383 Other fatigue: Secondary | ICD-10-CM

## 2014-08-09 DIAGNOSIS — I480 Paroxysmal atrial fibrillation: Secondary | ICD-10-CM

## 2014-08-09 DIAGNOSIS — I10 Essential (primary) hypertension: Secondary | ICD-10-CM

## 2014-08-09 DIAGNOSIS — Z136 Encounter for screening for cardiovascular disorders: Secondary | ICD-10-CM | POA: Diagnosis not present

## 2014-08-09 DIAGNOSIS — R600 Localized edema: Secondary | ICD-10-CM

## 2014-08-09 NOTE — Progress Notes (Signed)
Patient ID: [name], male   DOB: 13-Oct-1926, 81 y.o.   MRN: [id number]      SUBJECTIVE: The patient presents for routine follow-up. Coronary angiography in May 2012 did not demonstrate any evidence of coronary artery disease. For the past 2 weeks, he has felt fatigued. Approximately 2 weeks ago while gardening, he almost passed out. His heart rate was 112 bpm. It has been fluctuating somewhat but he denies palpitations. He also denies chest pain and shortness of breath. He had diarrhea for 2 days over the weekend but denies hematochezia and melena. He also denies fevers. He also had nausea and vomiting with diarrhea. When he last saw his PCP approximately one to two months ago, he said he was doing fine. He denies dysuria.   Review of Systems: As per "subjective", otherwise negative.  Allergies  Allergen Reactions  . Flexeril [Cyclobenzaprine Hcl] Nausea And Vomiting  . Percocet [Oxycodone-Acetaminophen] Nausea And Vomiting  . Sulfonamide Derivatives     REACTION: RASH  . Vicodin [Hydrocodone-Acetaminophen] Nausea And Vomiting    Current Outpatient Prescriptions  Medication Sig Dispense Refill  . amLODipine (NORVASC) 5 MG tablet TAKE 1 TABLET BY MOUTH DAILY. 30 tablet 6  . aspirin 81 MG EC tablet Take 81 mg by mouth daily.      . Cholecalciferol (VITAMIN D) 400 UNITS capsule Take 800 Units by mouth daily.      . dorzolamide (TRUSOPT) 2 % ophthalmic solution Place 1 drop into the right eye 2 (two) times daily.     Marland Kitchen esomeprazole (NEXIUM) 20 MG capsule Take 20 mg by mouth daily.    . finasteride (PROSCAR) 5 MG tablet Take 5 mg by mouth daily.      Marland Kitchen ibuprofen (ADVIL,MOTRIN) 100 MG chewable tablet Chew by mouth every 8 (eight) hours as needed.    . latanoprost (XALATAN) 0.005 % ophthalmic solution Place 1 drop into both eyes at bedtime.     . Omega-3 Fatty Acids (FISH OIL) 1000 MG CAPS Take 2 capsules by mouth daily.     Marland Kitchen UNABLE TO FIND as directed. On CPAP     No current  facility-administered medications for this visit.    Past Medical History  Diagnosis Date  . Orthostatic hypotension   . Atrial fibrillation     bicarbonate perfusion study 10/11 EF 69%. small defects no ischemia. 7 metastases acheived. ER visit 11/10/09 ruled out MI normal sinus rhythm orthostasis after sublingual nitroglycerin   . AAA (abdominal aortic aneurysm)   . Hypertension   . Stress fracture of foot     left  . GERD (gastroesophageal reflux disease)   . BPH (benign prostatic hyperplasia)   . Swelling of both lower extremities   . Sleep apnea     CPAP at night  . PONV (postoperative nausea and vomiting)     Past Surgical History  Procedure Laterality Date  . Shoulder surgery Right 2005?    for open rotator cuff repair. [place]  . Wrist fracture surgery Right   . Colonoscopy N/A 08/27/2012    Procedure: COLONOSCOPY;  Surgeon: Rogene Houston, MD;  Location: [facility];  Service: Endoscopy;  Laterality: N/A;  325  . Cholecystectomy  2012    [facility] by Dr. Geroge Baseman  . Hernia repair Bilateral     Inguinal and umbilical  . Incisional hernia repair N/A 01/31/2014    Procedure: Fatima Blank HERNIORRHAPHY WITH MESH;  Surgeon: Jamesetta So, MD;  Location: [facility] ORS;  Service: General;  Laterality: N/A;  . Wound debridement Right 01/31/2014    Procedure: DEBRIDEMENT WOUND RIGHT LEG;  Surgeon: Jamesetta So, MD;  Location: [facility] ORS;  Service: General;  Laterality: Right;  . Insertion of mesh N/A 01/31/2014    Procedure: INSERTION OF MESH;  Surgeon: Jamesetta So, MD;  Location: [facility] ORS;  Service: General;  Laterality: N/A;    History   Social History  . Marital Status: Divorced    Spouse Name: N/A  . Number of Children: N/A  . Years of Education: N/A   Occupational History  . Not on file.   Social History Main Topics  . Smoking status: Former Smoker -- 1.00 packs/day for 10 years    Types: Cigarettes    Start date: 10/15/1943    Quit date: 01/28/1954    . Smokeless tobacco: Never Used     Comment: tobacco use - no  . Alcohol Use: No  . Drug Use: No  . Sexual Activity: Not on file   Other Topics Concern  . Not on file   Social History Narrative     Filed Vitals:   08/09/14 1613  BP: 122/74  Pulse: 75  Height: 5\' 8"  (1.727 m)  Weight: 190 lb 9.6 oz (86.456 kg)  SpO2: 90%    PHYSICAL EXAM General: NAD HEENT: Normal. Neck: No JVD, no thyromegaly. Lungs: Clear to auscultation bilaterally with normal respiratory effort. CV: Nondisplaced PMI.  Regular rate and rhythm, normal S1/S2, no S3/S4, no murmur. Trace-1+ pretibial and periankle edema. Wearing compression stockings. No carotid bruit.   Abdomen: Soft, mild right lower quadrant tenderness, no hepatosplenomegaly, no distention.  Neurologic: Alert and oriented x 3.  Psych: Normal affect. Skin: Normal. Musculoskeletal: Normal range of motion, no gross deformities. Extremities: No clubbing or cyanosis.   ECG: Most recent ECG reviewed.      ASSESSMENT AND PLAN:  Presyncope and fatigue No obvious infectious etiologies. Perhaps related to dehydration and possibly a viral illness. Has been on antibiotics but only two days of diarrhea would argue against C. Difficile colitis. Will obtain UA, LFT's, BMET, and CBC. Encouraged to see PCP. No obvious cardiac etiology.  Abdominal aneurysm Follows with Dr. Scot Dock of [facility]. Deemed to be stable.   Paroxysmal atrial fibrillation Denies palpitations. No clear evidence of symptomatic bradycardia. No anticoagulation at this time as apparently isolated instance. Asked him to keep a record of BP and HR.  Edema Wearing compression stockings.  Sleep apnea Using CPAP. This had likely been contributing to bradycardia, hypertension, and prior morning headaches.  Essential hypertension Well controlled on amlodipine 5 mg daily.   Dispo: f/u as previously scheduled in [place] office in August.   Kate Sable,  M.D., F.A.C.C.

## 2014-08-09 NOTE — Patient Instructions (Signed)
Your physician recommends that you schedule a follow-up appointment in: in [place] as already planned    Please get FASTING lab work LFT's,CBC,BMET,U/A     Thank you for choosing [facility] !

## 2014-08-12 DIAGNOSIS — R55 Syncope and collapse: Secondary | ICD-10-CM | POA: Diagnosis not present

## 2014-08-12 DIAGNOSIS — R42 Dizziness and giddiness: Secondary | ICD-10-CM | POA: Diagnosis not present

## 2014-08-12 DIAGNOSIS — R5383 Other fatigue: Secondary | ICD-10-CM | POA: Diagnosis not present

## 2014-08-12 LAB — CBC
HCT: 38.9 % — ABNORMAL LOW (ref 39.0–52.0)
Hemoglobin: 12.9 g/dL — ABNORMAL LOW (ref 13.0–17.0)
MCH: 30.1 pg (ref 26.0–34.0)
MCHC: 33.2 g/dL (ref 30.0–36.0)
MCV: 90.9 fL (ref 78.0–100.0)
MPV: 9.4 fL (ref 8.6–12.4)
Platelets: 135 10*3/uL — ABNORMAL LOW (ref 150–400)
RBC: 4.28 MIL/uL (ref 4.22–5.81)
RDW: 14.3 % (ref 11.5–15.5)
WBC: 5.7 10*3/uL (ref 4.0–10.5)

## 2014-08-13 LAB — BASIC METABOLIC PANEL
BUN: 9 mg/dL (ref 6–23)
CALCIUM: 9.2 mg/dL (ref 8.4–10.5)
CO2: 25 meq/L (ref 19–32)
Chloride: 108 mEq/L (ref 96–112)
Creat: 0.91 mg/dL (ref 0.50–1.35)
Glucose, Bld: 103 mg/dL — ABNORMAL HIGH (ref 70–99)
Potassium: 4.2 mEq/L (ref 3.5–5.3)
Sodium: 141 mEq/L (ref 135–145)

## 2014-08-13 LAB — URINALYSIS
BILIRUBIN URINE: NEGATIVE
Glucose, UA: NEGATIVE mg/dL
Hgb urine dipstick: NEGATIVE
Ketones, ur: NEGATIVE mg/dL
Leukocytes, UA: NEGATIVE
Nitrite: NEGATIVE
PH: 6.5 (ref 5.0–8.0)
Protein, ur: NEGATIVE mg/dL
Specific Gravity, Urine: 1.015 (ref 1.005–1.030)
Urobilinogen, UA: 0.2 mg/dL (ref 0.0–1.0)

## 2014-08-13 LAB — HEPATIC FUNCTION PANEL
ALT: 10 U/L (ref 0–53)
AST: 14 U/L (ref 0–37)
Albumin: 3.5 g/dL (ref 3.5–5.2)
Alkaline Phosphatase: 58 U/L (ref 39–117)
Bilirubin, Direct: 0.1 mg/dL (ref 0.0–0.3)
Indirect Bilirubin: 0.7 mg/dL (ref 0.2–1.2)
Total Bilirubin: 0.8 mg/dL (ref 0.2–1.2)
Total Protein: 6 g/dL (ref 6.0–8.3)

## 2014-08-15 DIAGNOSIS — D234 Other benign neoplasm of skin of scalp and neck: Secondary | ICD-10-CM | POA: Diagnosis not present

## 2014-08-15 DIAGNOSIS — L820 Inflamed seborrheic keratosis: Secondary | ICD-10-CM | POA: Diagnosis not present

## 2014-08-15 DIAGNOSIS — Z85828 Personal history of other malignant neoplasm of skin: Secondary | ICD-10-CM | POA: Diagnosis not present

## 2014-08-15 DIAGNOSIS — L570 Actinic keratosis: Secondary | ICD-10-CM | POA: Diagnosis not present

## 2014-09-16 ENCOUNTER — Ambulatory Visit (INDEPENDENT_AMBULATORY_CARE_PROVIDER_SITE_OTHER): Payer: Medicare Other | Admitting: Urology

## 2014-09-16 DIAGNOSIS — N3000 Acute cystitis without hematuria: Secondary | ICD-10-CM

## 2014-09-16 DIAGNOSIS — N138 Other obstructive and reflux uropathy: Secondary | ICD-10-CM

## 2014-09-16 DIAGNOSIS — R339 Retention of urine, unspecified: Secondary | ICD-10-CM | POA: Diagnosis not present

## 2014-09-16 DIAGNOSIS — N401 Enlarged prostate with lower urinary tract symptoms: Secondary | ICD-10-CM

## 2014-10-26 DIAGNOSIS — H52203 Unspecified astigmatism, bilateral: Secondary | ICD-10-CM | POA: Diagnosis not present

## 2014-10-26 DIAGNOSIS — H2513 Age-related nuclear cataract, bilateral: Secondary | ICD-10-CM | POA: Diagnosis not present

## 2014-10-26 DIAGNOSIS — H25013 Cortical age-related cataract, bilateral: Secondary | ICD-10-CM | POA: Diagnosis not present

## 2014-10-26 DIAGNOSIS — H4011X3 Primary open-angle glaucoma, severe stage: Secondary | ICD-10-CM | POA: Diagnosis not present

## 2014-11-02 DIAGNOSIS — G4733 Obstructive sleep apnea (adult) (pediatric): Secondary | ICD-10-CM | POA: Diagnosis not present

## 2014-11-02 DIAGNOSIS — I714 Abdominal aortic aneurysm, without rupture: Secondary | ICD-10-CM | POA: Diagnosis not present

## 2014-11-02 DIAGNOSIS — E782 Mixed hyperlipidemia: Secondary | ICD-10-CM | POA: Diagnosis not present

## 2014-11-02 DIAGNOSIS — I480 Paroxysmal atrial fibrillation: Secondary | ICD-10-CM | POA: Diagnosis not present

## 2014-11-02 DIAGNOSIS — I10 Essential (primary) hypertension: Secondary | ICD-10-CM | POA: Diagnosis not present

## 2014-11-02 DIAGNOSIS — N400 Enlarged prostate without lower urinary tract symptoms: Secondary | ICD-10-CM | POA: Diagnosis not present

## 2014-11-02 DIAGNOSIS — M810 Age-related osteoporosis without current pathological fracture: Secondary | ICD-10-CM | POA: Diagnosis not present

## 2014-11-02 DIAGNOSIS — M19011 Primary osteoarthritis, right shoulder: Secondary | ICD-10-CM | POA: Diagnosis not present

## 2014-11-02 DIAGNOSIS — K219 Gastro-esophageal reflux disease without esophagitis: Secondary | ICD-10-CM | POA: Diagnosis not present

## 2014-11-02 DIAGNOSIS — E871 Hypo-osmolality and hyponatremia: Secondary | ICD-10-CM | POA: Diagnosis not present

## 2014-11-02 DIAGNOSIS — M7551 Bursitis of right shoulder: Secondary | ICD-10-CM | POA: Diagnosis not present

## 2014-11-11 DIAGNOSIS — K219 Gastro-esophageal reflux disease without esophagitis: Secondary | ICD-10-CM | POA: Diagnosis not present

## 2014-11-11 DIAGNOSIS — E782 Mixed hyperlipidemia: Secondary | ICD-10-CM | POA: Diagnosis not present

## 2014-11-11 DIAGNOSIS — I10 Essential (primary) hypertension: Secondary | ICD-10-CM | POA: Diagnosis not present

## 2014-11-14 DIAGNOSIS — Z85828 Personal history of other malignant neoplasm of skin: Secondary | ICD-10-CM | POA: Diagnosis not present

## 2014-11-14 DIAGNOSIS — L570 Actinic keratosis: Secondary | ICD-10-CM | POA: Diagnosis not present

## 2014-11-17 ENCOUNTER — Encounter (HOSPITAL_COMMUNITY): Payer: Self-pay | Admitting: Emergency Medicine

## 2014-11-17 ENCOUNTER — Emergency Department (HOSPITAL_COMMUNITY): Payer: Medicare Other

## 2014-11-17 ENCOUNTER — Inpatient Hospital Stay (HOSPITAL_COMMUNITY)
Admission: EM | Admit: 2014-11-17 | Discharge: 2014-11-19 | DRG: 392 | Disposition: A | Discharge status: Home / Self Care | Payer: Medicare Other | Attending: Internal Medicine | Admitting: Internal Medicine

## 2014-11-17 DIAGNOSIS — Z8249 Family history of ischemic heart disease and other diseases of the circulatory system: Secondary | ICD-10-CM | POA: Diagnosis not present

## 2014-11-17 DIAGNOSIS — I10 Essential (primary) hypertension: Secondary | ICD-10-CM | POA: Diagnosis present

## 2014-11-17 DIAGNOSIS — K529 Noninfective gastroenteritis and colitis, unspecified: Principal | ICD-10-CM | POA: Diagnosis present

## 2014-11-17 DIAGNOSIS — G4730 Sleep apnea, unspecified: Secondary | ICD-10-CM | POA: Diagnosis present

## 2014-11-17 DIAGNOSIS — R197 Diarrhea, unspecified: Secondary | ICD-10-CM | POA: Diagnosis not present

## 2014-11-17 DIAGNOSIS — Z87891 Personal history of nicotine dependence: Secondary | ICD-10-CM | POA: Diagnosis not present

## 2014-11-17 DIAGNOSIS — R1030 Lower abdominal pain, unspecified: Secondary | ICD-10-CM | POA: Diagnosis not present

## 2014-11-17 DIAGNOSIS — K219 Gastro-esophageal reflux disease without esophagitis: Secondary | ICD-10-CM | POA: Diagnosis present

## 2014-11-17 HISTORY — DX: Dorsalgia, unspecified: M54.9

## 2014-11-17 HISTORY — DX: Other chronic pain: G89.29

## 2014-11-17 LAB — BASIC METABOLIC PANEL
Anion gap: 7 (ref 5–15)
BUN: 10 mg/dL (ref 6–20)
CALCIUM: 9 mg/dL (ref 8.9–10.3)
CHLORIDE: 103 mmol/L (ref 101–111)
CO2: 25 mmol/L (ref 22–32)
CREATININE: 1 mg/dL (ref 0.61–1.24)
GFR calc non Af Amer: >60 mL/min (ref >60)
Glucose, Bld: 125 mg/dL — ABNORMAL HIGH (ref 65–99)
Potassium: 4.5 mmol/L (ref 3.5–5.1)
SODIUM: 135 mmol/L (ref 135–145)

## 2014-11-17 LAB — HEPATIC FUNCTION PANEL
ALBUMIN: 3.2 g/dL — AB (ref 3.5–5.0)
ALK PHOS: 71 U/L (ref 38–126)
ALT: 15 U/L — ABNORMAL LOW (ref 17–63)
AST: 21 U/L (ref 15–41)
BILIRUBIN TOTAL: 1 mg/dL (ref 0.3–1.2)
Bilirubin, Direct: 0.1 mg/dL (ref 0.1–0.5)
Indirect Bilirubin: 0.9 mg/dL (ref 0.3–0.9)
TOTAL PROTEIN: 6.5 g/dL (ref 6.5–8.1)

## 2014-11-17 LAB — LIPASE, BLOOD: Lipase: 26 U/L (ref 11–51)

## 2014-11-17 LAB — URINE MICROSCOPIC-ADD ON

## 2014-11-17 LAB — CBC WITH DIFFERENTIAL/PLATELET
BASOS PCT: 1 %
Basophils Absolute: 0 10*3/uL (ref 0.0–0.1)
EOS ABS: 0.1 10*3/uL (ref 0.0–0.7)
EOS PCT: 1 %
HCT: 38.9 % — ABNORMAL LOW (ref 39.0–52.0)
Hemoglobin: 13.6 g/dL (ref 13.0–17.0)
LYMPHS ABS: 1.8 10*3/uL (ref 0.7–4.0)
Lymphocytes Relative: 28 %
MCH: 31.6 pg (ref 26.0–34.0)
MCHC: 35 g/dL (ref 30.0–36.0)
MCV: 90.3 fL (ref 78.0–100.0)
MONO ABS: 1.2 10*3/uL — AB (ref 0.1–1.0)
Monocytes Relative: 19 %
NEUTROS PCT: 51 %
Neutro Abs: 3.3 10*3/uL (ref 1.7–7.7)
PLATELETS: 173 10*3/uL (ref 150–400)
RBC: 4.31 MIL/uL (ref 4.22–5.81)
RDW: 12.1 % (ref 11.5–15.5)
WBC: 6.5 10*3/uL (ref 4.0–10.5)

## 2014-11-17 LAB — URINALYSIS, ROUTINE W REFLEX MICROSCOPIC
BILIRUBIN URINE: NEGATIVE
Glucose, UA: NEGATIVE mg/dL
Ketones, ur: NEGATIVE mg/dL
Leukocytes, UA: NEGATIVE
Nitrite: NEGATIVE
PROTEIN: NEGATIVE mg/dL
SPECIFIC GRAVITY, URINE: 1.01 (ref 1.005–1.030)
UROBILINOGEN UA: 0.2 mg/dL (ref 0.0–1.0)
pH: 5.5 (ref 5.0–8.0)

## 2014-11-17 LAB — C DIFFICILE QUICK SCREEN W PCR REFLEX
C DIFFICLE (CDIFF) ANTIGEN: NEGATIVE
C Diff interpretation: NEGATIVE
C Diff toxin: NEGATIVE

## 2014-11-17 LAB — LACTIC ACID, PLASMA
Lactic Acid, Venous: 1.4 mmol/L (ref 0.5–2.0)
Lactic Acid, Venous: 1.2 mmol/L (ref 0.5–2.0)

## 2014-11-17 MED ORDER — METRONIDAZOLE IN NACL 5-0.79 MG/ML-% IV SOLN
500.0000 mg | Freq: Once | INTRAVENOUS | Status: AC
  Administered 2014-11-17: 500 mg via INTRAVENOUS
  Filled 2014-11-17: qty 100

## 2014-11-17 MED ORDER — ONDANSETRON HCL 4 MG/2ML IJ SOLN: 4.0000 mg | Freq: Three times a day (TID) | INTRAMUSCULAR | Status: DC | PRN

## 2014-11-17 MED ORDER — ONDANSETRON HCL 4 MG PO TABS: 4.0000 mg | ORAL_TABLET | Freq: Four times a day (QID) | ORAL | Status: DC | PRN

## 2014-11-17 MED ORDER — METRONIDAZOLE IN NACL 5-0.79 MG/ML-% IV SOLN: 500.0000 mg | Freq: Three times a day (TID) | INTRAVENOUS | Status: DC

## 2014-11-17 MED ORDER — ONDANSETRON HCL 4 MG/2ML IJ SOLN: 4.0000 mg | Freq: Four times a day (QID) | INTRAMUSCULAR | Status: DC | PRN

## 2014-11-17 MED ORDER — DORZOLAMIDE HCL 2 % OP SOLN
1.0000 [drp] | Freq: Two times a day (BID) | OPHTHALMIC | Status: DC
  Administered 2014-11-18: 1 [drp] via OPHTHALMIC
  Filled 2014-11-17: qty 10

## 2014-11-17 MED ORDER — CIPROFLOXACIN IN D5W 400 MG/200ML IV SOLN
400.0000 mg | Freq: Two times a day (BID) | INTRAVENOUS | Status: DC
  Administered 2014-11-17 – 2014-11-19 (×4): 400 mg via INTRAVENOUS
  Filled 2014-11-17 (×4): qty 200

## 2014-11-17 MED ORDER — PANTOPRAZOLE SODIUM 40 MG PO TBEC
40.0000 mg | DELAYED_RELEASE_TABLET | Freq: Every day | ORAL | Status: DC
  Administered 2014-11-19: 40 mg via ORAL
  Filled 2014-11-17 (×2): qty 1

## 2014-11-17 MED ORDER — LATANOPROST 0.005 % OP SOLN
1.0000 [drp] | Freq: Every day | OPHTHALMIC | Status: DC
  Administered 2014-11-18: 1 [drp] via OPHTHALMIC
  Filled 2014-11-17: qty 2.5

## 2014-11-17 MED ORDER — SODIUM CHLORIDE 0.9 % IV SOLN
INTRAVENOUS | Status: DC
  Administered 2014-11-17 – 2014-11-19 (×3): via INTRAVENOUS

## 2014-11-17 MED ORDER — MORPHINE SULFATE (PF) 2 MG/ML IV SOLN: 1.0000 mg | INTRAVENOUS | Status: DC | PRN

## 2014-11-17 MED ORDER — ONDANSETRON HCL 4 MG/2ML IJ SOLN
4.0000 mg | INTRAMUSCULAR | Status: DC | PRN
  Administered 2014-11-17: 4 mg via INTRAVENOUS
  Filled 2014-11-17: qty 2

## 2014-11-17 MED ORDER — METRONIDAZOLE IN NACL 5-0.79 MG/ML-% IV SOLN
500.0000 mg | Freq: Three times a day (TID) | INTRAVENOUS | Status: DC
  Administered 2014-11-18 – 2014-11-19 (×5): 500 mg via INTRAVENOUS
  Filled 2014-11-17 (×5): qty 100

## 2014-11-17 MED ORDER — IOHEXOL 300 MG/ML  SOLN
50.0000 mL | Freq: Once | INTRAMUSCULAR | Status: AC | PRN
  Administered 2014-11-17: 50 mL via ORAL

## 2014-11-17 MED ORDER — CHOLECALCIFEROL 10 MCG (400 UNIT) PO TABS
800.0000 [IU] | ORAL_TABLET | Freq: Every day | ORAL | Status: DC
  Administered 2014-11-19: 800 [IU] via ORAL
  Filled 2014-11-17 (×2): qty 2

## 2014-11-17 MED ORDER — SODIUM CHLORIDE 0.9 % IV SOLN: INTRAVENOUS | Status: DC

## 2014-11-17 MED ORDER — PANTOPRAZOLE SODIUM 40 MG PO TBEC: 40.0000 mg | DELAYED_RELEASE_TABLET | Freq: Every day | ORAL | Status: DC

## 2014-11-17 MED ORDER — OMEGA-3-ACID ETHYL ESTERS 1 G PO CAPS
2.0000 | ORAL_CAPSULE | Freq: Every day | ORAL | Status: DC
  Administered 2014-11-19: 2 g via ORAL
  Filled 2014-11-17 (×2): qty 2

## 2014-11-17 MED ORDER — FINASTERIDE 5 MG PO TABS
5.0000 mg | ORAL_TABLET | Freq: Every day | ORAL | Status: DC
  Filled 2014-11-17 (×5): qty 1

## 2014-11-17 MED ORDER — ASPIRIN EC 81 MG PO TBEC
81.0000 mg | DELAYED_RELEASE_TABLET | Freq: Every day | ORAL | Status: DC
  Administered 2014-11-18 – 2014-11-19 (×2): 81 mg via ORAL
  Filled 2014-11-17 (×2): qty 1

## 2014-11-17 MED ORDER — ENOXAPARIN SODIUM 40 MG/0.4ML ~~LOC~~ SOLN
40.0000 mg | SUBCUTANEOUS | Status: DC
  Administered 2014-11-18: 40 mg via SUBCUTANEOUS
  Filled 2014-11-17 (×2): qty 0.4

## 2014-11-17 MED ORDER — ENOXAPARIN SODIUM 40 MG/0.4ML ~~LOC~~ SOLN: 40.0000 mg | SUBCUTANEOUS | Status: DC

## 2014-11-17 MED ORDER — IOHEXOL 300 MG/ML  SOLN
100.0000 mL | Freq: Once | INTRAMUSCULAR | Status: AC | PRN
  Administered 2014-11-17: 100 mL via INTRAVENOUS

## 2014-11-17 MED ORDER — AMLODIPINE BESYLATE 5 MG PO TABS
5.0000 mg | ORAL_TABLET | Freq: Every day | ORAL | Status: DC
  Administered 2014-11-19: 5 mg via ORAL
  Filled 2014-11-17 (×2): qty 1

## 2014-11-17 MED ORDER — AMLODIPINE BESYLATE 5 MG PO TABS: 5.0000 mg | ORAL_TABLET | Freq: Every day | ORAL | Status: DC

## 2014-11-17 NOTE — Progress Notes (Signed)
ANTIBIOTIC CONSULT NOTE-Preliminary  Pharmacy Consult for Cipro Indication: intra-abdominal infection  Allergies  Allergen Reactions  . Flexeril [Cyclobenzaprine Hcl] Nausea And Vomiting  . Percocet [Oxycodone-Acetaminophen] Nausea And Vomiting  . Sulfonamide Derivatives     REACTION: RASH  . Vicodin [Hydrocodone-Acetaminophen] Nausea And Vomiting    Patient Measurements: Height: 5\' 8"  (172.7 cm) Weight: 190 lb (86.183 kg) IBW/kg (Calculated) : 68.4  Vital Signs: Temp: 98.1 F (36.7 C) (10/20 1313) Temp Source: Oral (10/20 1313) BP: 130/79 mmHg (10/20 1805) Pulse Rate: 65 (10/20 1805)  Labs:  Recent Labs  11/17/14 1446  WBC 6.5  HGB 13.6  PLT 173  CREATININE 1.00    Estimated Creatinine Clearance: 56.6 mL/min (by C-G formula based on Cr of 1).  No results for input(s): VANCOTROUGH, VANCOPEAK, VANCORANDOM, GENTTROUGH, GENTPEAK, GENTRANDOM, TOBRATROUGH, TOBRAPEAK, TOBRARND, AMIKACINPEAK, AMIKACINTROU, AMIKACIN in the last 72 hours.   Microbiology: No results found for this or any previous visit (from the past 720 hour(s)).  Medical History: Past Medical History  Diagnosis Date  . Orthostatic hypotension   . Atrial fibrillation (HCC)     bicarbonate perfusion study 10/11 EF 69%. small defects no ischemia. 7 metastases acheived. ER visit 11/10/09 ruled out MI normal sinus rhythm orthostasis after sublingual nitroglycerin   . AAA (abdominal aortic aneurysm) ([place])   . Hypertension   . Stress fracture of foot     left  . GERD (gastroesophageal reflux disease)   . BPH (benign prostatic hyperplasia)   . Swelling of both lower extremities   . Sleep apnea     CPAP at night  . PONV (postoperative nausea and vomiting)   . Chronic back pain     Assessment: Estimated Creatinine Clearance: 56.6 mL/min (by C-G formula based on Cr of 1). 81yo male with c/o diarrhea and chills, abdominal pain.  Goal of Therapy:  Eradicate infection.  Plan:  Preliminary review of  pertinent patient information completed.  Protocol will be initiated with Cipro 400mg  IV q12h.  Forestine Na clinical pharmacist will complete review during morning rounds to assess patient.    Hart Robinsons A, RPH 11/17/2014,6:22 PM

## 2014-11-17 NOTE — H&P (Signed)
[facility] History and Physical  AUGUST LONGEST TDS:[id number] DOB: Oct 03, 1926 DOA: 11/17/2014  Referring physician: ER PCP: Curlene Labrum, MD   Chief Complaint: Diarrhea, nausea and abdominal pain  HPI: [name] is a 81 y.o. male  This is a very pleasant 81 year old man who gives a 2 day history of watery stools/diarrhea associated with lower abdominal pain and nausea. He also has had subjective fevers. He has had anorexia. Thankfully, he has not had any vomiting. There is no hematemesis. There is no history of recent antibiotics. Evaluation in the emergency room shows that he has colitis. He is now being admitted for further management.   Review of Systems:  Apart from symptoms above, all systems are negative.  Past Medical History  Diagnosis Date  . Orthostatic hypotension   . Atrial fibrillation (HCC)     bicarbonate perfusion study 10/11 EF 69%. small defects no ischemia. 7 metastases acheived. ER visit 11/10/09 ruled out MI normal sinus rhythm orthostasis after sublingual nitroglycerin   . AAA (abdominal aortic aneurysm) ([place])   . Hypertension   . Stress fracture of foot     left  . GERD (gastroesophageal reflux disease)   . BPH (benign prostatic hyperplasia)   . Swelling of both lower extremities   . Sleep apnea     CPAP at night  . PONV (postoperative nausea and vomiting)   . Chronic back pain    Past Surgical History  Procedure Laterality Date  . Shoulder surgery Right 2005?    for open rotator cuff repair. [place]  . Wrist fracture surgery Right   . Colonoscopy N/A 08/27/2012    Procedure: COLONOSCOPY;  Surgeon: Rogene Houston, MD;  [facility];  Service: Endoscopy;  Laterality: N/A;  325  . Cholecystectomy  2012    [facility] by Dr. Geroge Baseman  . Hernia repair Bilateral     Inguinal and umbilical  . Incisional hernia repair N/A 01/31/2014    Procedure: Fatima Blank HERNIORRHAPHY WITH MESH;  Surgeon: Jamesetta So,  MD;  Location: [facility] ORS;  Service: General;  Laterality: N/A;  . Wound debridement Right 01/31/2014    Procedure: DEBRIDEMENT WOUND RIGHT LEG;  Surgeon: Jamesetta So, MD;  Location: [facility] OR[facility];  Service: General;  Laterality: Right;  . Insertion of mesh N/A 01/31/2014    Procedure: INSERTION OF MESH;  Surgeon: Jamesetta So, MD;  Location: [facility] ORS;  Service: General;  Laterality: N/A;   Social History:  reports that he quit smoking about 60 years ago. His smoking use included Cigarettes. He started smoking about 71 years ago. He has a 10 pack-year smoking history. He has never used smokeless tobacco. He reports that he does not drink alcohol or use illicit drugs.  Allergies  Allergen Reactions  . Flexeril [Cyclobenzaprine Hcl] Nausea And Vomiting  . Percocet [Oxycodone-Acetaminophen] Nausea And Vomiting  . Sulfonamide Derivatives     REACTION: RASH  . Vicodin [Hydrocodone-Acetaminophen] Nausea And Vomiting    Family History  Problem Relation Age of Onset  . Heart failure Mother   . Heart failure Father   . Cancer Sister     Prior to Admission medications   Medication Sig Start Date End Date Taking? Authorizing Provider  amLODipine (NORVASC) 5 MG tablet TAKE 1 TABLET BY MOUTH DAILY. 07/27/14  Yes Herminio Commons, MD  aspirin 81 MG EC tablet Take 81 mg by mouth daily.     Yes Historical Provider, MD  Cholecalciferol (VITAMIN  D) 400 UNITS capsule Take 800 Units by mouth daily.     Yes Historical Provider, MD  dorzolamide (TRUSOPT) 2 % ophthalmic solution Place 1 drop into the right eye 2 (two) times daily.    Yes Historical Provider, MD  esomeprazole (NEXIUM) 20 MG capsule Take 20 mg by mouth daily.   Yes Historical Provider, MD  finasteride (PROSCAR) 5 MG tablet Take 5 mg by mouth daily.     Yes Historical Provider, MD  ibuprofen (ADVIL,MOTRIN) 200 MG tablet Take 200 mg by mouth every 6 (six) hours as needed for mild pain or moderate pain.   Yes Historical Provider, MD  latanoprost  (XALATAN) 0.005 % ophthalmic solution Place 1 drop into both eyes at bedtime.    Yes Historical Provider, MD  loperamide (IMODIUM A-D) 2 MG tablet Take 2 mg by mouth 4 (four) times daily as needed for diarrhea or loose stools.   Yes Historical Provider, MD  Loperamide HCl (IMODIUM A-D) 1 MG/7.5ML LIQD Take 7.5 mLs by mouth daily as needed (for diarrhea).   Yes Historical Provider, MD  Omega-3 Fatty Acids (FISH OIL) 1000 MG CAPS Take 2 capsules by mouth daily.    Yes Historical Provider, MD  UNABLE TO FIND at bedtime. On CPAP   Yes Historical Provider, MD   Physical Exam: Filed Vitals:   11/17/14 1313 11/17/14 1741 11/17/14 1805 11/17/14 1830  BP: 129/84 119/77 130/79 120/76  Pulse: 71 60 65 60  Temp: 98.1 F (36.7 C)     TempSrc: Oral     Resp: 16 18 18 16   Height: 5\' 8"  (1.727 m)     Weight: 86.183 kg (190 lb)     SpO2: 97% 95% 94% 94%    Wt Readings from Last 3 Encounters:  11/17/14 86.183 kg (190 lb)  08/09/14 86.456 kg (190 lb 9.6 oz)  05/04/14 84.369 kg (186 lb)    General:  Appears calm and comfortable. He is not toxic or septic. Eyes: PERRL, normal lids, irises & conjunctiva ENT: grossly normal hearing, lips & tongue Neck: no LAD, masses or thyromegaly Cardiovascular: RRR, no m/r/g. No LE edema. Telemetry: SR, no arrhythmias  Respiratory: CTA bilaterally, no w/r/r. Normal respiratory effort. Abdomen: soft, mild tenderness in the lower abdomen. Bowel sounds are scanty. He does not clinically appear to have an acute abdomen. Skin: no rash or induration seen on limited exam Musculoskeletal: grossly normal tone BUE/BLE Psychiatric: grossly normal mood and affect, speech fluent and appropriate Neurologic: grossly non-focal.          Labs on Admission:  Basic Metabolic Panel:  Recent Labs Lab 11/17/14 1446  NA 135  K 4.5  CL 103  CO2 25  GLUCOSE 125*  BUN 10  CREATININE 1.00  CALCIUM 9.0   Liver Function Tests:  Recent Labs Lab 11/17/14 1513  AST 21  ALT  15*  ALKPHOS 71  BILITOT 1.0  PROT 6.5  ALBUMIN 3.2*    Recent Labs Lab 11/17/14 1513  LIPASE 26   No results for input(s): AMMONIA in the last 168 hours. CBC:  Recent Labs Lab 11/17/14 1446  WBC 6.5  NEUTROABS 3.3  HGB 13.6  HCT 38.9*  MCV 90.3  PLT 173   Cardiac Enzymes: No results for input(s): CKTOTAL, CKMB, CKMBINDEX, TROPONINI in the last 168 hours.  BNP (last 3 results) No results for input(s): BNP in the last 8760 hours.  ProBNP (last 3 results) No results for input(s): PROBNP in the last 8760 hours.  CBG: No results for input(s): GLUCAP in the last 168 hours.  Radiological Exams on Admission: Dg Chest 2 View  11/17/2014  CLINICAL DATA:  Lower abdominal pain, nausea, diarrhea and chills for 1 week. EXAM: CHEST  2 VIEW COMPARISON:  Chest x-ray 09/03/2010 FINDINGS: The cardiac silhouette, mediastinal and hilar contours are within normal limits and stable. There is mild tortuosity and calcification of the thoracic aorta. The lungs demonstrate mild chronic bronchitic changes but no infiltrates, edema or effusions. Linear scarring changes noted at both lung bases. A small hiatal hernia is noted. No pleural effusion. No pneumothorax. The bony thorax is intact. Stable surgical changes involving the right shoulder. IMPRESSION: No active cardiopulmonary disease. Electronically Signed   By: Marijo Sanes M.D.   On: 11/17/2014 16:30   Ct Abdomen Pelvis W Contrast  11/17/2014  CLINICAL DATA:  Lower abdominal and pelvic pain, diarrhea, chills, and nausea for 1 week, atrial fibrillation, hypertension, GERD, BPH, former smoker EXAM: CT ABDOMEN AND PELVIS WITH CONTRAST TECHNIQUE: Multidetector CT imaging of the abdomen and pelvis was performed using the standard protocol following bolus administration of intravenous contrast. Sagittal and coronal MPR images reconstructed from axial data set. CONTRAST:  87mL OMNIPAQUE IOHEXOL 300 MG/ML SOLN, 121mL OMNIPAQUE IOHEXOL 300 MG/ML SOLN  COMPARISON:  07/05/2012 FINDINGS: Lung bases appear emphysematous but clear. Multiple BILATERAL renal cysts. Tiny nonobstructing mid LEFT renal calculus. Mild fatty infiltration of liver. Liver, spleen, pancreas, kidneys, and adrenal glands otherwise unremarkable. Gallbladder surgically absent. Fusiform infrarenal abdominal aortic aneurysm 4.8 x 4.2 cm image 29. No extension of aneurysm into aortic bifurcation. Descending and sigmoid colonic diverticulosis. Scattered mild diffuse colonic wall thickening with hyperemia of mesocolon suspicious for colitis. No evidence of perforation or abscess. Appendix not localized Moderate-sized hiatal hernia. Small bowel loops unremarkable. Prostatic enlargement gland 5.2 x 5.2 cm image 69. Tiny dependent calculus within LEFT lateral aspect of urinary bladder. Bladder and ureters otherwise unremarkable. Prior RIGHT inguinal herniorrhaphy. No mass, adenopathy, free air or free fluid. Bones diffusely demineralized. IMPRESSION: Diffuse colonic wall thickening and hyperemia of the mesocolon likely representing colitis; differential diagnosis includes infection, inflammatory bowel disease, ischemia less likely. Diverticulosis of descending and sigmoid colon. Fusiform aneurysmal dilatation mid abdominal aorta 4.8 x 4.2 cm without evidence of surrounding hemorrhage. Fatty infiltration of liver. BILATERAL renal cysts and tiny nonobstructing LEFT renal calculus. Prostatic enlargement. Moderate-sized hiatal hernia. Electronically Signed   By: Lavonia Dana M.D.   On: 11/17/2014 17:19    Assessment/Plan   1. Colitis. He'll be treated with intravenous antibiotics. Clear liquid diet. IV fluids.  He'll be admitted to the medical floor and further recommendations will depend on patient's hospital progress.  Code Status: Full code.  DVT Prophylaxis: Lovenox.  Family Communication: I discussed the plan with the patient at the bedside.   Disposition Plan: Home when medically  stable.  Time spent: 45 minutes.  Doree Albee [facility] Pager [phone].

## 2014-11-17 NOTE — ED Provider Notes (Signed)
CSN: [id number]     Arrival date & time 11/17/14  1303 History   First MD Initiated Contact with Patient 11/17/14 1511     Chief Complaint  Patient presents with  . Diarrhea      HPI Pt was seen at 1510. Per pt, c/o gradual onset and persistence of multiple intermittent episodes of diarrhea that began 3 days ago.   Describes the stools as "watery." Has been associated with chills, nausea, poor PO intake, and lower abd "pain." States he took an imodium last night with resulting decreased amount of stools today, but increasing abd pain. Pt has not taken his temperature. Denies vomiting, no CP/SOB, no back pain, no fevers, no black or blood in stools, no dysuria, no testicular pain/swelling.     Past Medical History  Diagnosis Date  . Orthostatic hypotension   . Atrial fibrillation (HCC)     bicarbonate perfusion study 10/11 EF 69%. small defects no ischemia. 7 metastases acheived. ER visit 11/10/09 ruled out MI normal sinus rhythm orthostasis after sublingual nitroglycerin   . AAA (abdominal aortic aneurysm) ([place])   . Hypertension   . Stress fracture of foot     left  . GERD (gastroesophageal reflux disease)   . BPH (benign prostatic hyperplasia)   . Swelling of both lower extremities   . Sleep apnea     CPAP at night  . PONV (postoperative nausea and vomiting)   . Chronic back pain    Past Surgical History  Procedure Laterality Date  . Shoulder surgery Right 2005?    for open rotator cuff repair. [place]  . Wrist fracture surgery Right   . Colonoscopy N/A 08/27/2012    Procedure: COLONOSCOPY;  Surgeon: Rogene Houston, MD;  Location: [facility];  Service: Endoscopy;  Laterality: N/A;  325  . Cholecystectomy  2012    [facility] by Dr. Geroge Baseman  . Hernia repair Bilateral     Inguinal and umbilical  . Incisional hernia repair N/A 01/31/2014    Procedure: Fatima Blank HERNIORRHAPHY WITH MESH;  Surgeon: Jamesetta So, MD;  Location: [facility] ORS;  Service: General;   Laterality: N/A;  . Wound debridement Right 01/31/2014    Procedure: DEBRIDEMENT WOUND RIGHT LEG;  Surgeon: Jamesetta So, MD;  Location: [facility] ORS;  Service: General;  Laterality: Right;  . Insertion of mesh N/A 01/31/2014    Procedure: INSERTION OF MESH;  Surgeon: Jamesetta So, MD;  Location: [facility] ORS;  Service: General;  Laterality: N/A;   Family History  Problem Relation Age of Onset  . Heart failure Mother   . Heart failure Father   . Cancer Sister    Social History  Substance Use Topics  . Smoking status: Former Smoker -- 1.00 packs/day for 10 years    Types: Cigarettes    Start date: 10/15/1943    Quit date: 01/28/1954  . Smokeless tobacco: Never Used     Comment: tobacco use - no  . Alcohol Use: No    Review of Systems ROS: Statement: All systems negative except as marked or noted in the HPI; Constitutional: Negative for objective fever and +chills. ; ; Eyes: Negative for eye pain, redness and discharge. ; ; ENMT: Negative for ear pain, hoarseness, nasal congestion, sinus pressure and sore throat. ; ; Cardiovascular: Negative for chest pain, palpitations, diaphoresis, dyspnea and peripheral edema. ; ; Respiratory: Negative for cough, wheezing and stridor. ; ; Gastrointestinal: +nausea, abd pain. Negative for vomiting, blood in  stool, hematemesis, jaundice and rectal bleeding. . ; ; Genitourinary: Negative for dysuria, flank pain and hematuria. ; ; Musculoskeletal: Negative for back pain and neck pain. Negative for swelling and trauma.; ; Skin: Negative for pruritus, rash, abrasions, blisters, bruising and skin lesion.; ; Neuro: Negative for headache, lightheadedness and neck stiffness. Negative for weakness, altered level of consciousness , altered mental status, extremity weakness, paresthesias, involuntary movement, seizure and syncope.      Allergies  Flexeril; Percocet; Sulfonamide derivatives; and Vicodin  Home Medications   Prior to Admission medications   Medication Sig  Start Date End Date Taking? Authorizing Provider  amLODipine (NORVASC) 5 MG tablet TAKE 1 TABLET BY MOUTH DAILY. 07/27/14  Yes Herminio Commons, MD  aspirin 81 MG EC tablet Take 81 mg by mouth daily.     Yes Historical Provider, MD  Cholecalciferol (VITAMIN D) 400 UNITS capsule Take 800 Units by mouth daily.     Yes Historical Provider, MD  dorzolamide (TRUSOPT) 2 % ophthalmic solution Place 1 drop into the right eye 2 (two) times daily.    Yes Historical Provider, MD  esomeprazole (NEXIUM) 20 MG capsule Take 20 mg by mouth daily.   Yes Historical Provider, MD  finasteride (PROSCAR) 5 MG tablet Take 5 mg by mouth daily.     Yes Historical Provider, MD  ibuprofen (ADVIL,MOTRIN) 200 MG tablet Take 200 mg by mouth every 6 (six) hours as needed for mild pain or moderate pain.   Yes Historical Provider, MD  latanoprost (XALATAN) 0.005 % ophthalmic solution Place 1 drop into both eyes at bedtime.    Yes Historical Provider, MD  loperamide (IMODIUM A-D) 2 MG tablet Take 2 mg by mouth 4 (four) times daily as needed for diarrhea or loose stools.   Yes Historical Provider, MD  Loperamide HCl (IMODIUM A-D) 1 MG/7.5ML LIQD Take 7.5 mLs by mouth daily as needed (for diarrhea).   Yes Historical Provider, MD  Omega-3 Fatty Acids (FISH OIL) 1000 MG CAPS Take 2 capsules by mouth daily.    Yes Historical Provider, MD  UNABLE TO FIND at bedtime. On CPAP   Yes Historical Provider, MD   BP 129/84 mmHg  Pulse 71  Temp(Src) 98.1 F (36.7 C) (Oral)  Resp 16  Ht 5\' 8"  (1.727 m)  Wt 190 lb (86.183 kg)  BMI 28.90 kg/m2  SpO2 97%   15:27 Orthostatic Vital Signs VP  Orthostatic Lying  - BP- Lying: 128/69 mmHg ; Pulse- Lying: 63  Orthostatic Sitting - BP- Sitting: 124/72 mmHg ; Pulse- Sitting: 63  Orthostatic Standing at 0 minutes - BP- Standing at 0 minutes: 119/73 mmHg ; Pulse- Standing at 0 minutes: 66      Physical Exam  1515; Physical examination:  Nursing notes reviewed; Vital signs and O2 SAT reviewed;   Constitutional: Well developed, Well nourished, Well hydrated, In no acute distress; Head:  Normocephalic, atraumatic; Eyes: EOMI, PERRL, No scleral icterus; ENMT: Mouth and pharynx normal, Mucous membranes moist; Neck: Supple, Full range of motion, No lymphadenopathy; Cardiovascular: Regular rate and rhythm, No gallop; Respiratory: Breath sounds clear & equal bilaterally, No wheezes.  Speaking full sentences with ease, Normal respiratory effort/excursion; Chest: Nontender, Movement normal; Abdomen: Soft, +mild lower abd tenderness to palp. No rebound or guarding. Nondistended, Normal bowel sounds; Genitourinary: No CVA tenderness; Extremities: Pulses normal, No tenderness, No edema, No calf edema or asymmetry.; Neuro: AA&Ox3, Major CN grossly intact.  Speech clear. No gross focal motor or sensory deficits in extremities. Climbs on  and off stretcher easily by himself. Gait steady.; Skin: Color normal, Warm, Dry.   ED Course  Procedures (including critical care time) Labs Review   Imaging Review  I have personally reviewed and evaluated these images and lab results as part of my medical decision-making.   EKG Interpretation None      MDM  MDM Reviewed: nursing note, vitals and previous chart Reviewed previous: labs Interpretation: labs, x-ray and CT scan   Results for orders placed or performed during the hospital encounter of 11/17/14  CBC with Differential  Result Value Ref Range   WBC 6.5 4.0 - 10.5 K/uL   RBC 4.31 4.22 - 5.81 MIL/uL   Hemoglobin 13.6 13.0 - 17.0 g/dL   HCT 38.9 (L) 39.0 - 52.0 %   MCV 90.3 78.0 - 100.0 fL   MCH 31.6 26.0 - 34.0 pg   MCHC 35.0 30.0 - 36.0 g/dL   RDW 12.1 11.5 - 15.5 %   Platelets 173 150 - 400 K/uL   Neutrophils Relative % 51 %   Neutro Abs 3.3 1.7 - 7.7 K/uL   Lymphocytes Relative 28 %   Lymphs Abs 1.8 0.7 - 4.0 K/uL   Monocytes Relative 19 %   Monocytes Absolute 1.2 (H) 0.1 - 1.0 K/uL   Eosinophils Relative 1 %   Eosinophils Absolute  0.1 0.0 - 0.7 K/uL   Basophils Relative 1 %   Basophils Absolute 0.0 0.0 - 0.1 K/uL  Basic metabolic panel  Result Value Ref Range   Sodium 135 135 - 145 mmol/L   Potassium 4.5 3.5 - 5.1 mmol/L   Chloride 103 101 - 111 mmol/L   CO2 25 22 - 32 mmol/L   Glucose, Bld 125 (H) 65 - 99 mg/dL   BUN 10 6 - 20 mg/dL   Creatinine, Ser 1.00 0.61 - 1.24 mg/dL   Calcium 9.0 8.9 - 10.3 mg/dL   GFR calc non Af Amer >60 >60 mL/min   GFR calc Af Amer >60 >60 mL/min   Anion gap 7 5 - 15  Urinalysis, Routine w reflex microscopic  Result Value Ref Range   Color, Urine YELLOW YELLOW   APPearance CLEAR CLEAR   Specific Gravity, Urine 1.010 1.005 - 1.030   pH 5.5 5.0 - 8.0   Glucose, UA NEGATIVE NEGATIVE mg/dL   Hgb urine dipstick MODERATE (A) NEGATIVE   Bilirubin Urine NEGATIVE NEGATIVE   Ketones, ur NEGATIVE NEGATIVE mg/dL   Protein, ur NEGATIVE NEGATIVE mg/dL   Urobilinogen, UA 0.2 0.0 - 1.0 mg/dL   Nitrite NEGATIVE NEGATIVE   Leukocytes, UA NEGATIVE NEGATIVE  Lipase, blood  Result Value Ref Range   Lipase 26 11 - 51 U/L  Lactic acid, plasma  Result Value Ref Range   Lactic Acid, Venous 1.4 0.5 - 2.0 mmol/L  Hepatic function panel  Result Value Ref Range   Total Protein 6.5 6.5 - 8.1 g/dL   Albumin 3.2 (L) 3.5 - 5.0 g/dL   AST 21 15 - 41 U/L   ALT 15 (L) 17 - 63 U/L   Alkaline Phosphatase 71 38 - 126 U/L   Total Bilirubin 1.0 0.3 - 1.2 mg/dL   Bilirubin, Direct 0.1 0.1 - 0.5 mg/dL   Indirect Bilirubin 0.9 0.3 - 0.9 mg/dL  Urine microscopic-add on  Result Value Ref Range   Squamous Epithelial / LPF RARE RARE   WBC, UA 0-2 <3 WBC/hpf   RBC / HPF 3-6 <3 RBC/hpf   Bacteria, UA RARE RARE  Dg Chest 2 View 11/17/2014  CLINICAL DATA:  Lower abdominal pain, nausea, diarrhea and chills for 1 week. EXAM: CHEST  2 VIEW COMPARISON:  Chest x-ray 09/03/2010 FINDINGS: The cardiac silhouette, mediastinal and hilar contours are within normal limits and stable. There is mild tortuosity and  calcification of the thoracic aorta. The lungs demonstrate mild chronic bronchitic changes but no infiltrates, edema or effusions. Linear scarring changes noted at both lung bases. A small hiatal hernia is noted. No pleural effusion. No pneumothorax. The bony thorax is intact. Stable surgical changes involving the right shoulder. IMPRESSION: No active cardiopulmonary disease. Electronically Signed   By: Marijo Sanes M.D.   On: 11/17/2014 16:30   Ct Abdomen Pelvis W Contrast 11/17/2014  CLINICAL DATA:  Lower abdominal and pelvic pain, diarrhea, chills, and nausea for 1 week, atrial fibrillation, hypertension, GERD, BPH, former smoker EXAM: CT ABDOMEN AND PELVIS WITH CONTRAST TECHNIQUE: Multidetector CT imaging of the abdomen and pelvis was performed using the standard protocol following bolus administration of intravenous contrast. Sagittal and coronal MPR images reconstructed from axial data set. CONTRAST:  51mL OMNIPAQUE IOHEXOL 300 MG/ML SOLN, 188mL OMNIPAQUE IOHEXOL 300 MG/ML SOLN COMPARISON:  07/05/2012 FINDINGS: Lung bases appear emphysematous but clear. Multiple BILATERAL renal cysts. Tiny nonobstructing mid LEFT renal calculus. Mild fatty infiltration of liver. Liver, spleen, pancreas, kidneys, and adrenal glands otherwise unremarkable. Gallbladder surgically absent. Fusiform infrarenal abdominal aortic aneurysm 4.8 x 4.2 cm image 29. No extension of aneurysm into aortic bifurcation. Descending and sigmoid colonic diverticulosis. Scattered mild diffuse colonic wall thickening with hyperemia of mesocolon suspicious for colitis. No evidence of perforation or abscess. Appendix not localized Moderate-sized hiatal hernia. Small bowel loops unremarkable. Prostatic enlargement gland 5.2 x 5.2 cm image 69. Tiny dependent calculus within LEFT lateral aspect of urinary bladder. Bladder and ureters otherwise unremarkable. Prior RIGHT inguinal herniorrhaphy. No mass, adenopathy, free air or free fluid. Bones  diffusely demineralized. IMPRESSION: Diffuse colonic wall thickening and hyperemia of the mesocolon likely representing colitis; differential diagnosis includes infection, inflammatory bowel disease, ischemia less likely. Diverticulosis of descending and sigmoid colon. Fusiform aneurysmal dilatation mid abdominal aorta 4.8 x 4.2 cm without evidence of surrounding hemorrhage. Fatty infiltration of liver. BILATERAL renal cysts and tiny nonobstructing LEFT renal calculus. Prostatic enlargement. Moderate-sized hiatal hernia. Electronically Signed   By: Lavonia Dana M.D.   On: 11/17/2014 17:19    1815:  IV cipro/flagyl started. Stool for cdiff and GI pathogen panel sent to labs. Dx and testing d/w pt and family.  Questions answered.  Verb understanding, agreeable to admit. T/C to [facility] Dr. Anastasio Champion, case discussed, including:  HPI, pertinent PM/SHx, VS/PE, dx testing, ED course and treatment:  Agreeable to admit, requests to write temporary orders, obtain medical bed to team [facility].      Francine Graven, DO 11/22/14 1221

## 2014-11-17 NOTE — ED Notes (Signed)
Pt reports diarrhea and chills x 1 week. Pt reports lower abdominal pain and nausea.

## 2014-11-17 NOTE — ED Notes (Signed)
Hospitalist at bedside 

## 2014-11-18 DIAGNOSIS — K529 Noninfective gastroenteritis and colitis, unspecified: Principal | ICD-10-CM

## 2014-11-18 LAB — CBC
HEMATOCRIT: 35.4 % — AB (ref 39.0–52.0)
HEMOGLOBIN: 12.2 g/dL — AB (ref 13.0–17.0)
MCH: 30.9 pg (ref 26.0–34.0)
MCHC: 34.5 g/dL (ref 30.0–36.0)
MCV: 89.6 fL (ref 78.0–100.0)
PLATELETS: 154 10*3/uL (ref 150–400)
RBC: 3.95 MIL/uL — AB (ref 4.22–5.81)
RDW: 12.1 % (ref 11.5–15.5)
WBC: 6 10*3/uL (ref 4.0–10.5)

## 2014-11-18 LAB — COMPREHENSIVE METABOLIC PANEL
ALT: 14 U/L — AB (ref 17–63)
ANION GAP: 6 (ref 5–15)
AST: 20 U/L (ref 15–41)
Albumin: 2.8 g/dL — ABNORMAL LOW (ref 3.5–5.0)
Alkaline Phosphatase: 59 U/L (ref 38–126)
BUN: 7 mg/dL (ref 6–20)
CHLORIDE: 106 mmol/L (ref 101–111)
CO2: 25 mmol/L (ref 22–32)
CREATININE: 0.91 mg/dL (ref 0.61–1.24)
Calcium: 8.6 mg/dL — ABNORMAL LOW (ref 8.9–10.3)
Glucose, Bld: 131 mg/dL — ABNORMAL HIGH (ref 65–99)
POTASSIUM: 3.9 mmol/L (ref 3.5–5.1)
SODIUM: 137 mmol/L (ref 135–145)
Total Bilirubin: 1.1 mg/dL (ref 0.3–1.2)
Total Protein: 5.9 g/dL — ABNORMAL LOW (ref 6.5–8.1)

## 2014-11-18 MED ORDER — FINASTERIDE 5 MG PO TABS
5.0000 mg | ORAL_TABLET | Freq: Every day | ORAL | Status: DC
  Administered 2014-11-18: 5 mg via ORAL
  Filled 2014-11-18 (×3): qty 1

## 2014-11-18 NOTE — Care Management Note (Signed)
Case Management Note  Patient Details  Name: [name] MRN: [id number] Date of Birth: Apr 07, 1926  Subjective/Objective:                  Pt admitted from home with colitis. Pt lives with significant other and will return home at discharge. Pt is independent with ADL's.  Action/Plan: Anticipate discharge within 48 hours. No CM needs anticipated.  Expected Discharge Date:     11/20/14             Expected Discharge Plan:  Home/Self Care  In-House Referral:  NA  Discharge planning Services  CM Consult  Post Acute Care Choice:  NA Choice offered to:  NA  DME Arranged:    DME Agency:     HH Arranged:    HH Agency:     Status of Service:  Completed, signed off  Medicare Important Message Given:  Yes-second notification given Date Medicare IM Given:    Medicare IM give by:    Date Additional Medicare IM Given:    Additional Medicare Important Message give by:     If discussed at [place] of Stay Meetings, dates discussed:    Additional Comments:  Joylene Draft, RN 11/18/2014, 10:34 AM

## 2014-11-18 NOTE — Care Management Important Message (Signed)
Important Message  Patient Details  Name: ESAW KNIPPEL MRN: [id number] Date of Birth: [birth year]   Medicare Important Message Given:  Yes-second notification given    Joylene Draft, RN 11/18/2014, 10:33 AM

## 2014-11-18 NOTE — Progress Notes (Signed)
[facility] PROGRESS NOTE  [name] FBP:[id number] DOB: 1926/11/01 DOA: 11/17/2014 PCP: Curlene Labrum, MD  Assessment/Plan: Colitis -Pain seems improved today; still with diarrhea. -Advance diet at patient's request. -Continue cipro/flagyl.  HTN -Well controlled. -continue home meds.  Code Status: Full Code Family Communication: Daughter at bedside updated on plan of care and all questions answered.  Disposition Plan: home in 24-48 hours.   Consultants:  none   Antibiotics:  cipro  Flagyl   Subjective: Feels less abdominal pain. At least 7 episodes of diarrhea overnight.  Objective: Filed Vitals:   11/17/14 1830 11/17/14 1900 11/17/14 2104 11/18/14 0649  BP: 120/76 130/78 120/75 109/58  Pulse: 60 63 60 60  Temp:   98.2 F (36.8 C) 98.6 F (37 C)  TempSrc:   Oral Oral  Resp: 16  18 18   Height:   5\' 8"  (1.727 m)   Weight:   86.1 kg (189 lb 13.1 oz)   SpO2: 94% 90% 94% 93%    Intake/Output Summary (Last 24 hours) at 11/18/14 1351 Last data filed at 11/18/14 0853  Gross per 24 hour  Intake 1467.5 ml  Output      0 ml  Net 1467.5 ml   Filed Weights   11/17/14 1313 11/17/14 2104  Weight: 86.183 kg (190 lb) 86.1 kg (189 lb 13.1 oz)    Exam:   General:  AA Ox3  Cardiovascular: RRR  Respiratory: CTA B  Abdomen: S/NT/ND/+BS  Extremities: no C/C/E   Neurologic:  Grossly intact and non-focal  Data Reviewed: Basic Metabolic Panel:  Recent Labs Lab 11/17/14 1446 11/18/14 0713  NA 135 137  K 4.5 3.9  CL 103 106  CO2 25 25  GLUCOSE 125* 131*  BUN 10 7  CREATININE 1.00 0.91  CALCIUM 9.0 8.6*   Liver Function Tests:  Recent Labs Lab 11/17/14 1513 11/18/14 0713  AST 21 20  ALT 15* 14*  ALKPHOS 71 59  BILITOT 1.0 1.1  PROT 6.5 5.9*  ALBUMIN 3.2* 2.8*    Recent Labs Lab 11/17/14 1513  LIPASE 26   No results for input(s): AMMONIA in the last 168 hours. CBC:  Recent Labs Lab 11/17/14 1446  11/18/14 0713  WBC 6.5 6.0  NEUTROABS 3.3  --   HGB 13.6 12.2*  HCT 38.9* 35.4*  MCV 90.3 89.6  PLT 173 154   Cardiac Enzymes: No results for input(s): CKTOTAL, CKMB, CKMBINDEX, TROPONINI in the last 168 hours. BNP (last 3 results) No results for input(s): BNP in the last 8760 hours.  ProBNP (last 3 results) No results for input(s): PROBNP in the last 8760 hours.  CBG: No results for input(s): GLUCAP in the last 168 hours.  Recent Results (from the past 240 hour(s))  C difficile quick scan w PCR reflex     Status: None   Collection Time: 11/17/14  5:43 PM  Result Value Ref Range Status   C Diff antigen NEGATIVE NEGATIVE Final   C Diff toxin NEGATIVE NEGATIVE Final   C Diff interpretation Negative for toxigenic C. difficile  Final     Studies: Dg Chest 2 View  11/17/2014  CLINICAL DATA:  Lower abdominal pain, nausea, diarrhea and chills for 1 week. EXAM: CHEST  2 VIEW COMPARISON:  Chest x-ray 09/03/2010 FINDINGS: The cardiac silhouette, mediastinal and hilar contours are within normal limits and stable. There is mild tortuosity and calcification of the thoracic aorta. The lungs demonstrate mild chronic bronchitic changes but no infiltrates, edema or effusions.  Linear scarring changes noted at both lung bases. A small hiatal hernia is noted. No pleural effusion. No pneumothorax. The bony thorax is intact. Stable surgical changes involving the right shoulder. IMPRESSION: No active cardiopulmonary disease. Electronically Signed   By: Marijo Sanes M.D.   On: 11/17/2014 16:30   Ct Abdomen Pelvis W Contrast  11/17/2014  CLINICAL DATA:  Lower abdominal and pelvic pain, diarrhea, chills, and nausea for 1 week, atrial fibrillation, hypertension, GERD, BPH, former smoker EXAM: CT ABDOMEN AND PELVIS WITH CONTRAST TECHNIQUE: Multidetector CT imaging of the abdomen and pelvis was performed using the standard protocol following bolus administration of intravenous contrast. Sagittal and coronal  MPR images reconstructed from axial data set. CONTRAST:  17mL OMNIPAQUE IOHEXOL 300 MG/ML SOLN, 129mL OMNIPAQUE IOHEXOL 300 MG/ML SOLN COMPARISON:  07/05/2012 FINDINGS: Lung bases appear emphysematous but clear. Multiple BILATERAL renal cysts. Tiny nonobstructing mid LEFT renal calculus. Mild fatty infiltration of liver. Liver, spleen, pancreas, kidneys, and adrenal glands otherwise unremarkable. Gallbladder surgically absent. Fusiform infrarenal abdominal aortic aneurysm 4.8 x 4.2 cm image 29. No extension of aneurysm into aortic bifurcation. Descending and sigmoid colonic diverticulosis. Scattered mild diffuse colonic wall thickening with hyperemia of mesocolon suspicious for colitis. No evidence of perforation or abscess. Appendix not localized Moderate-sized hiatal hernia. Small bowel loops unremarkable. Prostatic enlargement gland 5.2 x 5.2 cm image 69. Tiny dependent calculus within LEFT lateral aspect of urinary bladder. Bladder and ureters otherwise unremarkable. Prior RIGHT inguinal herniorrhaphy. No mass, adenopathy, free air or free fluid. Bones diffusely demineralized. IMPRESSION: Diffuse colonic wall thickening and hyperemia of the mesocolon likely representing colitis; differential diagnosis includes infection, inflammatory bowel disease, ischemia less likely. Diverticulosis of descending and sigmoid colon. Fusiform aneurysmal dilatation mid abdominal aorta 4.8 x 4.2 cm without evidence of surrounding hemorrhage. Fatty infiltration of liver. BILATERAL renal cysts and tiny nonobstructing LEFT renal calculus. Prostatic enlargement. Moderate-sized hiatal hernia. Electronically Signed   By: Lavonia Dana M.D.   On: 11/17/2014 17:19    Scheduled Meds: . amLODipine  5 mg Oral Daily  . aspirin EC  81 mg Oral Daily  . cholecalciferol  800 Units Oral Daily  . ciprofloxacin  400 mg Intravenous Q12H  . dorzolamide  1 drop Right Eye BID  . enoxaparin (LOVENOX) injection  40 mg Subcutaneous Q24H  .  finasteride  5 mg Oral q1800  . latanoprost  1 drop Both Eyes QHS  . metronidazole  500 mg Intravenous Q8H  . omega-3 acid ethyl esters  2 capsule Oral Daily  . pantoprazole  40 mg Oral Daily   Continuous Infusions: . sodium chloride 75 mL/hr at 11/18/14 3220    Active Problems:   Colitis    Time spent: 25 minutes. Greater than 50% of this time was spent in direct contact with the patient coordinating care.    Lelon Frohlich  [facility] Pager [phone]  If 7PM-7AM, please contact night-coverage at [email or web address], password [email or web address] 11/18/2014, 1:51 PM  LOS: 1 day

## 2014-11-19 LAB — CBC
HCT: 35.7 % — ABNORMAL LOW (ref 39.0–52.0)
HEMOGLOBIN: 12.2 g/dL — AB (ref 13.0–17.0)
MCH: 31 pg (ref 26.0–34.0)
MCHC: 34.2 g/dL (ref 30.0–36.0)
MCV: 90.6 fL (ref 78.0–100.0)
PLATELETS: 172 10*3/uL (ref 150–400)
RBC: 3.94 MIL/uL — AB (ref 4.22–5.81)
RDW: 12.2 % (ref 11.5–15.5)
WBC: 5.7 10*3/uL (ref 4.0–10.5)

## 2014-11-19 LAB — GI PATHOGEN PANEL BY PCR, STOOL
C difficile toxin A/B: NOT DETECTED
Cryptosporidium by PCR: NOT DETECTED
E COLI (ETEC) LT/ST: NOT DETECTED
E COLI (STEC): NOT DETECTED
E coli 0157 by PCR: NOT DETECTED
G lamblia by PCR: NOT DETECTED
NOROVIRUS G1/G2: NOT DETECTED
ROTAVIRUS A BY PCR: NOT DETECTED
SALMONELLA BY PCR: NOT DETECTED
Shigella by PCR: NOT DETECTED

## 2014-11-19 LAB — BASIC METABOLIC PANEL
Anion gap: 7 (ref 5–15)
BUN: 6 mg/dL (ref 6–20)
CALCIUM: 8.8 mg/dL — AB (ref 8.9–10.3)
CO2: 23 mmol/L (ref 22–32)
CREATININE: 0.85 mg/dL (ref 0.61–1.24)
Chloride: 109 mmol/L (ref 101–111)
GFR calc non Af Amer: >60 mL/min (ref >60)
Glucose, Bld: 123 mg/dL — ABNORMAL HIGH (ref 65–99)
Potassium: 3.7 mmol/L (ref 3.5–5.1)
SODIUM: 139 mmol/L (ref 135–145)

## 2014-11-19 MED ORDER — METRONIDAZOLE 500 MG PO TABS: 500.0000 mg | ORAL_TABLET | Freq: Three times a day (TID) | ORAL | Status: DC

## 2014-11-19 MED ORDER — CIPROFLOXACIN HCL 500 MG PO TABS: 500.0000 mg | ORAL_TABLET | Freq: Two times a day (BID) | ORAL | Status: DC

## 2014-11-19 NOTE — Discharge Summary (Signed)
Physician Discharge Summary  [name]:[id number] DOB: 07-04-1926 DOA: 11/17/2014  PCP: Curlene Labrum, MD  Admit date: 11/17/2014 Discharge date: 11/19/2014  Time spent: 25 minutes  Recommendations for Outpatient Follow-up:  Follow up with PCP in 1-2 weeks.  Discharge Diagnoses:  Active Problems:   Colitis   Discharge Condition: Improved  Filed Weights   11/17/14 1313 11/17/14 2104  Weight: 86.183 kg (190 lb) 86.1 kg (189 lb 13.1 oz)    History of present illness:  As per Dr. Anastasio Champion 11/17/14, 81 year old male presented with a 2 day history of watery stools/diarrhea with associated lower abdominal pain and nausea. He also had subjective fevers and anorexia.  Evaluation in the emergency room revealed colitis. He was admitted for further management.  Hospital Course:   Colitis - Pain and diarrhea resolved. Tolerating normal diet without any nausea or vomiting.  - Continue PO cipro/flagyl for the next two weeks.  HTN -Well controlled. -continue home meds.  Procedures:  none  Consultations:  none  Discharge Instructions      Discharge Instructions    Diet - low sodium heart healthy    Complete by:  As directed      Increase activity slowly    Complete by:  As directed             Medication List    STOP taking these medications        IMODIUM A-D 1 MG/7.5ML Liqd  Generic drug:  Loperamide HCl     loperamide 2 MG tablet  Commonly known as:  IMODIUM A-D      TAKE these medications        amLODipine 5 MG tablet  Commonly known as:  NORVASC  TAKE 1 TABLET BY MOUTH DAILY.     aspirin 81 MG EC tablet  Take 81 mg by mouth daily.     ciprofloxacin 500 MG tablet  Commonly known as:  CIPRO  Take 1 tablet (500 mg total) by mouth 2 (two) times daily.     dorzolamide 2 % ophthalmic solution  Commonly known as:  TRUSOPT  Place 1 drop into the right eye 2 (two) times daily.     esomeprazole 20 MG capsule  Commonly known as:   NEXIUM  Take 20 mg by mouth daily.     finasteride 5 MG tablet  Commonly known as:  PROSCAR  Take 5 mg by mouth daily.     Fish Oil 1000 MG Caps  Take 2 capsules by mouth daily.     ibuprofen 200 MG tablet  Commonly known as:  ADVIL,MOTRIN  Take 200 mg by mouth every 6 (six) hours as needed for mild pain or moderate pain.     latanoprost 0.005 % ophthalmic solution  Commonly known as:  XALATAN  Place 1 drop into both eyes at bedtime.     metroNIDAZOLE 500 MG tablet  Commonly known as:  FLAGYL  Take 1 tablet (500 mg total) by mouth 3 (three) times daily.     UNABLE TO FIND  at bedtime. On CPAP     Vitamin D 400 UNITS capsule  Take 800 Units by mouth daily.       Allergies  Allergen Reactions  . Flexeril [Cyclobenzaprine Hcl] Nausea And Vomiting  . Percocet [Oxycodone-Acetaminophen] Nausea And Vomiting  . Sulfonamide Derivatives     REACTION: RASH  . Vicodin [Hydrocodone-Acetaminophen] Nausea And Vomiting   Follow-up Information    Follow up with Curlene Labrum, MD. Schedule  an appointment as soon as possible for a visit in 2 weeks.   Contact information:   [place]  [phone]        The results of significant diagnostics from this hospitalization (including imaging, microbiology, ancillary and laboratory) are listed below for reference.    Significant Diagnostic Studies: Dg Chest 2 View  11/17/2014  CLINICAL DATA:  Lower abdominal pain, nausea, diarrhea and chills for 1 week. EXAM: CHEST  2 VIEW COMPARISON:  Chest x-ray 09/03/2010 FINDINGS: The cardiac silhouette, mediastinal and hilar contours are within normal limits and stable. There is mild tortuosity and calcification of the thoracic aorta. The lungs demonstrate mild chronic bronchitic changes but no infiltrates, edema or effusions. Linear scarring changes noted at both lung bases. A small hiatal hernia is noted. No pleural effusion. No pneumothorax. The bony thorax is intact. Stable  surgical changes involving the right shoulder. IMPRESSION: No active cardiopulmonary disease. Electronically Signed   By: Marijo Sanes M.D.   On: 11/17/2014 16:30   Ct Abdomen Pelvis W Contrast  11/17/2014  CLINICAL DATA:  Lower abdominal and pelvic pain, diarrhea, chills, and nausea for 1 week, atrial fibrillation, hypertension, GERD, BPH, former smoker EXAM: CT ABDOMEN AND PELVIS WITH CONTRAST TECHNIQUE: Multidetector CT imaging of the abdomen and pelvis was performed using the standard protocol following bolus administration of intravenous contrast. Sagittal and coronal MPR images reconstructed from axial data set. CONTRAST:  45mL OMNIPAQUE IOHEXOL 300 MG/ML SOLN, 112mL OMNIPAQUE IOHEXOL 300 MG/ML SOLN COMPARISON:  07/05/2012 FINDINGS: Lung bases appear emphysematous but clear. Multiple BILATERAL renal cysts. Tiny nonobstructing mid LEFT renal calculus. Mild fatty infiltration of liver. Liver, spleen, pancreas, kidneys, and adrenal glands otherwise unremarkable. Gallbladder surgically absent. Fusiform infrarenal abdominal aortic aneurysm 4.8 x 4.2 cm image 29. No extension of aneurysm into aortic bifurcation. Descending and sigmoid colonic diverticulosis. Scattered mild diffuse colonic wall thickening with hyperemia of mesocolon suspicious for colitis. No evidence of perforation or abscess. Appendix not localized Moderate-sized hiatal hernia. Small bowel loops unremarkable. Prostatic enlargement gland 5.2 x 5.2 cm image 69. Tiny dependent calculus within LEFT lateral aspect of urinary bladder. Bladder and ureters otherwise unremarkable. Prior RIGHT inguinal herniorrhaphy. No mass, adenopathy, free air or free fluid. Bones diffusely demineralized. IMPRESSION: Diffuse colonic wall thickening and hyperemia of the mesocolon likely representing colitis; differential diagnosis includes infection, inflammatory bowel disease, ischemia less likely. Diverticulosis of descending and sigmoid colon. Fusiform aneurysmal  dilatation mid abdominal aorta 4.8 x 4.2 cm without evidence of surrounding hemorrhage. Fatty infiltration of liver. BILATERAL renal cysts and tiny nonobstructing LEFT renal calculus. Prostatic enlargement. Moderate-sized hiatal hernia. Electronically Signed   By: Lavonia Dana M.D.   On: 11/17/2014 17:19    Microbiology: Recent Results (from the past 240 hour(s))  C difficile quick scan w PCR reflex     Status: None   Collection Time: 11/17/14  5:43 PM  Result Value Ref Range Status   C Diff antigen NEGATIVE NEGATIVE Final   C Diff toxin NEGATIVE NEGATIVE Final   C Diff interpretation Negative for toxigenic C. difficile  Final     Labs: Basic Metabolic Panel:  Recent Labs Lab 11/17/14 1446 11/18/14 0713 11/19/14 0604  NA 135 137 139  K 4.5 3.9 3.7  CL 103 106 109  CO2 25 25 23   GLUCOSE 125* 131* 123*  BUN 10 7 6   CREATININE 1.00 0.91 0.85  CALCIUM 9.0 8.6* 8.8*   Liver Function Tests:  Recent Labs Lab 11/17/14  1513 11/18/14 0713  AST 21 20  ALT 15* 14*  ALKPHOS 71 59  BILITOT 1.0 1.1  PROT 6.5 5.9*  ALBUMIN 3.2* 2.8*    Recent Labs Lab 11/17/14 1513  LIPASE 26    CBC:  Recent Labs Lab 11/17/14 1446 11/18/14 0713 11/19/14 0604  WBC 6.5 6.0 5.7  NEUTROABS 3.3  --   --   HGB 13.6 12.2* 12.2*  HCT 38.9* 35.4* 35.7*  MCV 90.3 89.6 90.6  PLT 173 154 172      Signed:   Domingo Mend, MD  [facility] Pager: [phone] 11/19/2014, 10:20 AM   By signing my name below, I, Rosalie Doctor, attest that this documentation has been prepared under the direction and in the presence of Domingo Mend, MD.  Electronically Signed: Rosalie Doctor, Scribe. 11/19/2014 10:20am    I have reviewed the above documentation for accuracy and completeness, and I agree with the above.  Domingo Mend, MD [facility] Pager: [phone]

## 2014-11-19 NOTE — Progress Notes (Signed)
Patient discharged home.  IV removed - WNL.  Reviewed medications and instructed to complete doses of cipro and flagyl.  Instructed to follow up with PCP in 2 weeks.  No questions at this time.  Verbalizes understanding.  Assisted off unit via [place] with NT in stable condition

## 2014-12-07 DIAGNOSIS — K219 Gastro-esophageal reflux disease without esophagitis: Secondary | ICD-10-CM | POA: Diagnosis not present

## 2014-12-07 DIAGNOSIS — A045 Campylobacter enteritis: Secondary | ICD-10-CM | POA: Diagnosis not present

## 2014-12-07 DIAGNOSIS — I10 Essential (primary) hypertension: Secondary | ICD-10-CM | POA: Diagnosis not present

## 2014-12-07 DIAGNOSIS — M79672 Pain in left foot: Secondary | ICD-10-CM | POA: Diagnosis not present

## 2014-12-07 DIAGNOSIS — G4733 Obstructive sleep apnea (adult) (pediatric): Secondary | ICD-10-CM | POA: Diagnosis not present

## 2014-12-07 DIAGNOSIS — E782 Mixed hyperlipidemia: Secondary | ICD-10-CM | POA: Diagnosis not present

## 2014-12-07 DIAGNOSIS — S93412A Sprain of calcaneofibular ligament of left ankle, initial encounter: Secondary | ICD-10-CM | POA: Diagnosis not present

## 2014-12-07 DIAGNOSIS — M25572 Pain in left ankle and joints of left foot: Secondary | ICD-10-CM | POA: Diagnosis not present

## 2014-12-07 DIAGNOSIS — I480 Paroxysmal atrial fibrillation: Secondary | ICD-10-CM | POA: Diagnosis not present

## 2014-12-08 DIAGNOSIS — H52201 Unspecified astigmatism, right eye: Secondary | ICD-10-CM | POA: Diagnosis not present

## 2014-12-08 DIAGNOSIS — H4020X0 Unspecified primary angle-closure glaucoma, stage unspecified: Secondary | ICD-10-CM | POA: Diagnosis not present

## 2014-12-08 DIAGNOSIS — H2511 Age-related nuclear cataract, right eye: Secondary | ICD-10-CM | POA: Diagnosis not present

## 2014-12-08 DIAGNOSIS — H25811 Combined forms of age-related cataract, right eye: Secondary | ICD-10-CM | POA: Diagnosis not present

## 2014-12-08 DIAGNOSIS — H25011 Cortical age-related cataract, right eye: Secondary | ICD-10-CM | POA: Diagnosis not present

## 2015-01-05 DIAGNOSIS — H52202 Unspecified astigmatism, left eye: Secondary | ICD-10-CM | POA: Diagnosis not present

## 2015-01-05 DIAGNOSIS — H25812 Combined forms of age-related cataract, left eye: Secondary | ICD-10-CM | POA: Diagnosis not present

## 2015-01-05 DIAGNOSIS — H2512 Age-related nuclear cataract, left eye: Secondary | ICD-10-CM | POA: Diagnosis not present

## 2015-01-05 DIAGNOSIS — H401190 Primary open-angle glaucoma, unspecified eye, stage unspecified: Secondary | ICD-10-CM | POA: Diagnosis not present

## 2015-01-05 DIAGNOSIS — H25012 Cortical age-related cataract, left eye: Secondary | ICD-10-CM | POA: Diagnosis not present

## 2015-01-06 ENCOUNTER — Ambulatory Visit: Payer: Medicare Other | Admitting: Urology

## 2015-01-17 ENCOUNTER — Ambulatory Visit (INDEPENDENT_AMBULATORY_CARE_PROVIDER_SITE_OTHER): Payer: Medicare Other

## 2015-01-17 ENCOUNTER — Ambulatory Visit (INDEPENDENT_AMBULATORY_CARE_PROVIDER_SITE_OTHER): Payer: Medicare Other | Admitting: Podiatry

## 2015-01-17 ENCOUNTER — Encounter: Payer: Self-pay | Admitting: Podiatry

## 2015-01-17 VITALS — BP 112/59 | HR 54 | Resp 12

## 2015-01-17 DIAGNOSIS — I2510 Atherosclerotic heart disease of native coronary artery without angina pectoris: Secondary | ICD-10-CM | POA: Diagnosis not present

## 2015-01-17 DIAGNOSIS — R52 Pain, unspecified: Secondary | ICD-10-CM

## 2015-01-17 DIAGNOSIS — S96912A Strain of unspecified muscle and tendon at ankle and foot level, left foot, initial encounter: Secondary | ICD-10-CM

## 2015-01-17 NOTE — Progress Notes (Signed)
   Subjective:    Patient ID: [name], male    DOB: 03-07-1926, 81 y.o.   MRN: [id number]  HPI   This patient presents today with approximate 6 month history of a persistent chronic swollen left foot and ankle that has been evaluated by vascular surgeon, Dr. Doren Custard who advised patient to wear compression hose on ongoing continuous basis. Patient has worn the compression hose as instructed. Swelling in the left foot and leg decreases somewhat in the a.m. and increases in the p.m., however, is controlled with the compression hose. He now states the last 2-3 months the dorsal lateral aspect left foot is very painful upon initial standing and walking and is relieved with rest. He describes occasional discomfort on nonweightbearing in the left foot. Patient eyes any direct injury to the left foot. The symptoms have become painful over time.  Patient states that approximately 8-9 years ago he was placed in a Cam Walker boot which resulted in a DVT and ultimately in a P E  Review of Systems  Cardiovascular: Positive for leg swelling.  Musculoskeletal: Positive for joint swelling.       Objective:   Physical Exam  orientated 3  Vascular: Bilateral pitting edema left greater than right There is no calf tenderness bilaterally DP pulses 2/4 bilaterally PT pulses 1/4 bilaterally Capillary reflex immediate bilaterally  Neurological: Sensation to 10 g monofilament wire intact 3/5 bilaterally Vibratory sensation nonreactive bilaterally Ankle reflexes reactive bilaterally  Dermatological: No open skin lesions bilaterally  Musculoskeletal: HAV deformities bilaterally Palpable tenderness dorsal lateral aspect left foot without any palpable lesions Mild tenderness dorsal lateral left ankle without a palpable lesions There is no crepitus upon range of motion ankle, subtalar, midtarsal joints bilaterally  Manual motor testing Dorsi flexion, plantar flexion, inversion, eversion 5/5  bilaterally  X-ray examination foot and ankle today demonstrate generalized increased soft tissue density without any fracture and/or dislocation in the left foot and ankle        Assessment & Plan:    assessment: Chronic lymphedema left greater than right treated with compression No evidence of DVT Sprained foot left  Plan: Because patient has history of DVT associated with immobilization I deferred on Cam Walker boot A flat surgical shoe was dispensed to wear in the left foot pain ends Continue ongoing compression hose Over-the-counter 200 mg ibuprofen 2 tablets 3 times a day 7 days   Patient return if he has any future concerns if symptoms do not gradually subside

## 2015-01-17 NOTE — Patient Instructions (Signed)
Today your foot and ankle x-rays were negative for fracture Continue wearing her compression hose on your left foot/leg Wear the flat surgical shoe on your left foot until foot pain ends Using over-the-counter 200 mg ibuprofen tablets, take 2 tablets 3 times a day 7 days

## 2015-01-27 DIAGNOSIS — M19011 Primary osteoarthritis, right shoulder: Secondary | ICD-10-CM | POA: Diagnosis not present

## 2015-01-27 DIAGNOSIS — I10 Essential (primary) hypertension: Secondary | ICD-10-CM | POA: Diagnosis not present

## 2015-01-27 DIAGNOSIS — R600 Localized edema: Secondary | ICD-10-CM | POA: Diagnosis not present

## 2015-01-27 DIAGNOSIS — I480 Paroxysmal atrial fibrillation: Secondary | ICD-10-CM | POA: Diagnosis not present

## 2015-01-27 DIAGNOSIS — M79672 Pain in left foot: Secondary | ICD-10-CM | POA: Diagnosis not present

## 2015-01-27 DIAGNOSIS — E871 Hypo-osmolality and hyponatremia: Secondary | ICD-10-CM | POA: Diagnosis not present

## 2015-01-27 DIAGNOSIS — I714 Abdominal aortic aneurysm, without rupture: Secondary | ICD-10-CM | POA: Diagnosis not present

## 2015-02-02 DIAGNOSIS — R609 Edema, unspecified: Secondary | ICD-10-CM | POA: Diagnosis not present

## 2015-02-02 DIAGNOSIS — R931 Abnormal findings on diagnostic imaging of heart and coronary circulation: Secondary | ICD-10-CM | POA: Diagnosis not present

## 2015-02-02 DIAGNOSIS — I519 Heart disease, unspecified: Secondary | ICD-10-CM | POA: Diagnosis not present

## 2015-02-02 DIAGNOSIS — I080 Rheumatic disorders of both mitral and aortic valves: Secondary | ICD-10-CM | POA: Diagnosis not present

## 2015-02-02 DIAGNOSIS — R600 Localized edema: Secondary | ICD-10-CM | POA: Diagnosis not present

## 2015-02-02 DIAGNOSIS — I071 Rheumatic tricuspid insufficiency: Secondary | ICD-10-CM | POA: Diagnosis not present

## 2015-02-02 DIAGNOSIS — M19072 Primary osteoarthritis, left ankle and foot: Secondary | ICD-10-CM | POA: Diagnosis not present

## 2015-02-03 ENCOUNTER — Ambulatory Visit (INDEPENDENT_AMBULATORY_CARE_PROVIDER_SITE_OTHER): Payer: Medicare Other | Admitting: Urology

## 2015-02-03 ENCOUNTER — Other Ambulatory Visit: Payer: Self-pay | Admitting: Urology

## 2015-02-03 DIAGNOSIS — R3129 Other microscopic hematuria: Secondary | ICD-10-CM | POA: Diagnosis not present

## 2015-02-03 DIAGNOSIS — R351 Nocturia: Secondary | ICD-10-CM | POA: Diagnosis not present

## 2015-02-03 DIAGNOSIS — N200 Calculus of kidney: Secondary | ICD-10-CM | POA: Diagnosis not present

## 2015-02-06 ENCOUNTER — Other Ambulatory Visit: Payer: Self-pay | Admitting: Urology

## 2015-02-06 DIAGNOSIS — R3129 Other microscopic hematuria: Secondary | ICD-10-CM

## 2015-02-08 ENCOUNTER — Ambulatory Visit (HOSPITAL_COMMUNITY)
Admission: RE | Admit: 2015-02-08 | Discharge: 2015-02-08 | Disposition: A | Discharge status: Home / Self Care | Payer: Medicare Other | Source: Ambulatory Visit | Attending: Urology | Admitting: Urology

## 2015-02-08 DIAGNOSIS — R3129 Other microscopic hematuria: Secondary | ICD-10-CM | POA: Insufficient documentation

## 2015-02-08 DIAGNOSIS — N281 Cyst of kidney, acquired: Secondary | ICD-10-CM | POA: Insufficient documentation

## 2015-02-08 DIAGNOSIS — N200 Calculus of kidney: Secondary | ICD-10-CM | POA: Insufficient documentation

## 2015-02-09 DIAGNOSIS — I10 Essential (primary) hypertension: Secondary | ICD-10-CM | POA: Diagnosis not present

## 2015-02-09 DIAGNOSIS — E871 Hypo-osmolality and hyponatremia: Secondary | ICD-10-CM | POA: Diagnosis not present

## 2015-02-09 DIAGNOSIS — I714 Abdominal aortic aneurysm, without rupture: Secondary | ICD-10-CM | POA: Diagnosis not present

## 2015-02-09 DIAGNOSIS — M19011 Primary osteoarthritis, right shoulder: Secondary | ICD-10-CM | POA: Diagnosis not present

## 2015-02-09 DIAGNOSIS — M79672 Pain in left foot: Secondary | ICD-10-CM | POA: Diagnosis not present

## 2015-02-09 DIAGNOSIS — I480 Paroxysmal atrial fibrillation: Secondary | ICD-10-CM | POA: Diagnosis not present

## 2015-02-09 DIAGNOSIS — R600 Localized edema: Secondary | ICD-10-CM | POA: Diagnosis not present

## 2015-02-15 ENCOUNTER — Other Ambulatory Visit: Payer: Self-pay | Admitting: Cardiovascular Disease

## 2015-02-24 ENCOUNTER — Other Ambulatory Visit: Payer: Self-pay | Admitting: Urology

## 2015-02-24 ENCOUNTER — Ambulatory Visit (INDEPENDENT_AMBULATORY_CARE_PROVIDER_SITE_OTHER): Payer: Medicare Other | Admitting: Urology

## 2015-02-24 DIAGNOSIS — N281 Cyst of kidney, acquired: Secondary | ICD-10-CM

## 2015-02-24 DIAGNOSIS — N200 Calculus of kidney: Secondary | ICD-10-CM

## 2015-02-24 DIAGNOSIS — R3121 Asymptomatic microscopic hematuria: Secondary | ICD-10-CM | POA: Diagnosis not present

## 2015-02-24 DIAGNOSIS — N401 Enlarged prostate with lower urinary tract symptoms: Secondary | ICD-10-CM

## 2015-02-24 DIAGNOSIS — R351 Nocturia: Secondary | ICD-10-CM

## 2015-03-17 DIAGNOSIS — R0789 Other chest pain: Secondary | ICD-10-CM | POA: Diagnosis not present

## 2015-04-19 DIAGNOSIS — H401122 Primary open-angle glaucoma, left eye, moderate stage: Secondary | ICD-10-CM | POA: Diagnosis not present

## 2015-04-19 DIAGNOSIS — H401113 Primary open-angle glaucoma, right eye, severe stage: Secondary | ICD-10-CM | POA: Diagnosis not present

## 2015-04-28 DIAGNOSIS — E871 Hypo-osmolality and hyponatremia: Secondary | ICD-10-CM | POA: Diagnosis not present

## 2015-04-28 DIAGNOSIS — E782 Mixed hyperlipidemia: Secondary | ICD-10-CM | POA: Diagnosis not present

## 2015-04-28 DIAGNOSIS — I10 Essential (primary) hypertension: Secondary | ICD-10-CM | POA: Diagnosis not present

## 2015-04-28 DIAGNOSIS — K219 Gastro-esophageal reflux disease without esophagitis: Secondary | ICD-10-CM | POA: Diagnosis not present

## 2015-05-10 ENCOUNTER — Ambulatory Visit: Payer: Medicare Other | Admitting: Vascular Surgery

## 2015-05-10 ENCOUNTER — Other Ambulatory Visit (HOSPITAL_COMMUNITY): Payer: Medicare Other

## 2015-05-10 DIAGNOSIS — E782 Mixed hyperlipidemia: Secondary | ICD-10-CM | POA: Diagnosis not present

## 2015-05-10 DIAGNOSIS — K458 Other specified abdominal hernia without obstruction or gangrene: Secondary | ICD-10-CM | POA: Diagnosis not present

## 2015-05-10 DIAGNOSIS — M810 Age-related osteoporosis without current pathological fracture: Secondary | ICD-10-CM | POA: Diagnosis not present

## 2015-05-10 DIAGNOSIS — K219 Gastro-esophageal reflux disease without esophagitis: Secondary | ICD-10-CM | POA: Diagnosis not present

## 2015-05-10 DIAGNOSIS — I480 Paroxysmal atrial fibrillation: Secondary | ICD-10-CM | POA: Diagnosis not present

## 2015-05-10 DIAGNOSIS — I10 Essential (primary) hypertension: Secondary | ICD-10-CM | POA: Diagnosis not present

## 2015-05-10 DIAGNOSIS — I714 Abdominal aortic aneurysm, without rupture: Secondary | ICD-10-CM | POA: Diagnosis not present

## 2015-05-10 DIAGNOSIS — Z1389 Encounter for screening for other disorder: Secondary | ICD-10-CM | POA: Diagnosis not present

## 2015-05-17 ENCOUNTER — Encounter: Payer: Self-pay | Admitting: Vascular Surgery

## 2015-05-18 DIAGNOSIS — C4440 Unspecified malignant neoplasm of skin of scalp and neck: Secondary | ICD-10-CM | POA: Diagnosis not present

## 2015-05-18 DIAGNOSIS — C4441 Basal cell carcinoma of skin of scalp and neck: Secondary | ICD-10-CM | POA: Diagnosis not present

## 2015-05-18 DIAGNOSIS — L570 Actinic keratosis: Secondary | ICD-10-CM | POA: Diagnosis not present

## 2015-05-18 DIAGNOSIS — L820 Inflamed seborrheic keratosis: Secondary | ICD-10-CM | POA: Diagnosis not present

## 2015-05-18 DIAGNOSIS — Z85828 Personal history of other malignant neoplasm of skin: Secondary | ICD-10-CM | POA: Diagnosis not present

## 2015-05-18 DIAGNOSIS — B078 Other viral warts: Secondary | ICD-10-CM | POA: Diagnosis not present

## 2015-05-19 DIAGNOSIS — M79672 Pain in left foot: Secondary | ICD-10-CM | POA: Diagnosis not present

## 2015-05-19 DIAGNOSIS — M810 Age-related osteoporosis without current pathological fracture: Secondary | ICD-10-CM | POA: Diagnosis not present

## 2015-05-19 DIAGNOSIS — R948 Abnormal results of function studies of other organs and systems: Secondary | ICD-10-CM | POA: Diagnosis not present

## 2015-05-24 ENCOUNTER — Encounter: Payer: Self-pay | Admitting: Vascular Surgery

## 2015-05-24 ENCOUNTER — Ambulatory Visit (HOSPITAL_COMMUNITY)
Admission: RE | Admit: 2015-05-24 | Discharge: 2015-05-24 | Disposition: A | Discharge status: Home / Self Care | Payer: Medicare Other | Source: Ambulatory Visit | Attending: Vascular Surgery | Admitting: Vascular Surgery

## 2015-05-24 ENCOUNTER — Ambulatory Visit (INDEPENDENT_AMBULATORY_CARE_PROVIDER_SITE_OTHER): Payer: Medicare Other | Admitting: Vascular Surgery

## 2015-05-24 VITALS — BP 125/78 | HR 54 | Ht 68.0 in | Wt 186.3 lb

## 2015-05-24 DIAGNOSIS — I714 Abdominal aortic aneurysm, without rupture, unspecified: Secondary | ICD-10-CM

## 2015-05-24 DIAGNOSIS — K219 Gastro-esophageal reflux disease without esophagitis: Secondary | ICD-10-CM | POA: Diagnosis not present

## 2015-05-24 DIAGNOSIS — I10 Essential (primary) hypertension: Secondary | ICD-10-CM | POA: Insufficient documentation

## 2015-05-24 NOTE — Progress Notes (Signed)
[facility] [place]  Patient name: [name] MRN: [id number] DOB: 22-Jan-1927 Sex: male  REASON FOR VISIT: Follow up of abdominal aortic aneurysm  HPI: [name] is a 82 y.o. male who I last saw one year ago on 05/04/2014. At that time, the maximum diameter of his infrarenal abdominal aortic aneurysm was 4.4 cm. The right common iliac artery was 1.3 cm in maximum diameter. The left common iliac artery was 1.4 cm in maximum diameter. I set him up for a follow up duplex scan in 1 year. He returns for that visit.  He does have chronic low back pain. He denies abdominal pain.  He has had pain in his left foot which is fairly constant. The pain is relieved somewhat with elevation therefore this does not sound like rest pain. He's had an extensive workup for this which so far has been unremarkable. I have reviewed Dr. Phoebe Perch note and he felt that he might have a sprained left foot.   Past Medical History  Diagnosis Date  . Orthostatic hypotension   . Atrial fibrillation (HCC)     bicarbonate perfusion study 10/11 EF 69%. small defects no ischemia. 7 metastases acheived. ER visit 11/10/09 ruled out MI normal sinus rhythm orthostasis after sublingual nitroglycerin   . AAA (abdominal aortic aneurysm) ([place])   . Hypertension   . Stress fracture of foot     left  . GERD (gastroesophageal reflux disease)   . BPH (benign prostatic hyperplasia)   . Swelling of both lower extremities   . Sleep apnea     CPAP at night  . PONV (postoperative nausea and vomiting)   . Chronic back pain     Family History  Problem Relation Age of Onset  . Heart failure Mother   . Heart failure Father   . Cancer Sister     SOCIAL HISTORY: Social History  Substance Use Topics  . Smoking status: Former Smoker -- 1.00 packs/day for 10 years    Types: Cigarettes    Start date: 10/15/1943    Quit date: 01/28/1954  . Smokeless tobacco: Never Used     Comment: tobacco use -  no  . Alcohol Use: No    Allergies  Allergen Reactions  . Flexeril [Cyclobenzaprine Hcl] Nausea And Vomiting  . Oxycodone-Acetaminophen Nausea And Vomiting  . Percocet [Oxycodone-Acetaminophen] Nausea And Vomiting  . Sulfonamide Derivatives     REACTION: RASH  . Vicodin [Hydrocodone-Acetaminophen] Nausea And Vomiting    Current Outpatient Prescriptions  Medication Sig Dispense Refill  . amLODipine (NORVASC) 5 MG tablet TAKE 1 TABLET BY MOUTH DAILY 30 tablet 6  . aspirin 81 MG EC tablet Take 81 mg by mouth daily.      . Cholecalciferol (VITAMIN D) 400 UNITS capsule Take 800 Units by mouth daily.      . dorzolamide (TRUSOPT) 2 % ophthalmic solution Place 1 drop into the right eye 2 (two) times daily.     Marland Kitchen esomeprazole (NEXIUM) 20 MG capsule Take 20 mg by mouth daily.    . finasteride (PROSCAR) 5 MG tablet Take 5 mg by mouth daily.      Marland Kitchen latanoprost (XALATAN) 0.005 % ophthalmic solution Place 1 drop into both eyes at bedtime.     . Omega-3 Fatty Acids (FISH OIL) 1000 MG CAPS Take 2 capsules by mouth daily.     Marland Kitchen UNABLE TO FIND at bedtime. On CPAP     No current facility-administered medications for this visit.  REVIEW OF SYSTEMS:  [X]  denotes positive finding, [ ]  denotes negative finding Cardiac  Comments:  Chest pain or chest pressure:    Shortness of breath upon exertion: X   Short of breath when lying flat:    Irregular heart rhythm:        Vascular    Pain in calf, thigh, or hip brought on by ambulation:    Pain in feet at night that wakes you up from your sleep:     Blood clot in your veins:    Leg swelling:  X       Pulmonary    Oxygen at home:    Productive cough:     Wheezing:         Neurologic    Sudden weakness in arms or legs:     Sudden numbness in arms or legs:     Sudden onset of difficulty speaking or slurred speech:    Temporary loss of vision in one eye:     Problems with dizziness:         Gastrointestinal    Blood in stool:     Vomited  blood:         Genitourinary    Burning when urinating:     Blood in urine:        Psychiatric    Major depression:         Hematologic    Bleeding problems:    Problems with blood clotting too easily:        Skin    Rashes or ulcers:        Constitutional    Fever or chills:      PHYSICAL EXAM: Filed Vitals:   05/24/15 0837  BP: 125/78  Pulse: 54  Height: 5\' 8"  (1.727 m)  Weight: 186 lb 4.8 oz (84.505 kg)  SpO2: 96%    GENERAL: The patient is a well-nourished male, in no acute distress. The vital signs are documented above. CARDIAC: There is a regular rate and rhythm.  VASCULAR: I do not detect carotid bruits. On the left side, which is the site of concern, he has a palpable femoral, popliteal, and dorsalis pedis pulse. On the right side, he has a palpable femoral and popliteal pulse. I cannot palpate pedal pulses. Both legs have moderate swelling. PULMONARY: There is good air exchange bilaterally without wheezing or rales. ABDOMEN: Soft and non-tender with normal pitched bowel sounds. Because of his size it is difficult to palpate his aneurysm. He does have a ventral hernia. MUSCULOSKELETAL: There are no major deformities or cyanosis. NEUROLOGIC: No focal weakness or paresthesias are detected. SKIN: There are no ulcers or rashes noted. PSYCHIATRIC: The patient has a normal affect.  DATA:   DUPLEX ABDOMINAL AORTA: I have independently interpreted the duplex of his abdominal aorta which shows that the maximum diameter is 4.4 cm. Thus this has not changed over the last year. The right common iliac artery is also stable at 1.3 cm in maximum diameter. The left common iliac artery is also stable at 1.4 cm in maximum diameter.  MEDICAL ISSUES:  ABDOMINAL AORTIC ANEURYSM: his aneurysm has not changed in size over the last year. It measures 4.4 cm in maximum diameter. He has some ectasia of his iliac arteries which are also stable. He understands we would not consider  elective repair of his aneurysm unless it reached 5.5 cm in maximum diameter. Given his age, it is unlikely that he will require repair. He is  not a smoker. His blood pressure is under good control.  LYMPHEDEMA: He does have moderate bilateral lower extremity swelling. He does have compression stockings. We have discussed the importance of intermittent leg elevation and the proper positioning for this.   Deitra Mayo [facility] [place]: [phone]

## 2015-05-25 DIAGNOSIS — I480 Paroxysmal atrial fibrillation: Secondary | ICD-10-CM | POA: Diagnosis not present

## 2015-05-25 DIAGNOSIS — E871 Hypo-osmolality and hyponatremia: Secondary | ICD-10-CM | POA: Diagnosis not present

## 2015-05-25 DIAGNOSIS — I714 Abdominal aortic aneurysm, without rupture: Secondary | ICD-10-CM | POA: Diagnosis not present

## 2015-05-25 DIAGNOSIS — M19072 Primary osteoarthritis, left ankle and foot: Secondary | ICD-10-CM | POA: Diagnosis not present

## 2015-05-25 DIAGNOSIS — I10 Essential (primary) hypertension: Secondary | ICD-10-CM | POA: Diagnosis not present

## 2015-05-25 DIAGNOSIS — R600 Localized edema: Secondary | ICD-10-CM | POA: Diagnosis not present

## 2015-05-25 DIAGNOSIS — M19011 Primary osteoarthritis, right shoulder: Secondary | ICD-10-CM | POA: Diagnosis not present

## 2015-05-25 DIAGNOSIS — M79672 Pain in left foot: Secondary | ICD-10-CM | POA: Diagnosis not present

## 2015-06-08 DIAGNOSIS — Z79899 Other long term (current) drug therapy: Secondary | ICD-10-CM | POA: Diagnosis not present

## 2015-06-08 DIAGNOSIS — Z7982 Long term (current) use of aspirin: Secondary | ICD-10-CM | POA: Diagnosis not present

## 2015-06-08 DIAGNOSIS — I10 Essential (primary) hypertension: Secondary | ICD-10-CM | POA: Diagnosis not present

## 2015-06-08 DIAGNOSIS — Z87311 Personal history of (healed) other pathological fracture: Secondary | ICD-10-CM | POA: Diagnosis not present

## 2015-06-08 DIAGNOSIS — M810 Age-related osteoporosis without current pathological fracture: Secondary | ICD-10-CM | POA: Diagnosis not present

## 2015-06-15 DIAGNOSIS — M19011 Primary osteoarthritis, right shoulder: Secondary | ICD-10-CM | POA: Diagnosis not present

## 2015-06-15 DIAGNOSIS — E871 Hypo-osmolality and hyponatremia: Secondary | ICD-10-CM | POA: Diagnosis not present

## 2015-06-15 DIAGNOSIS — M19072 Primary osteoarthritis, left ankle and foot: Secondary | ICD-10-CM | POA: Diagnosis not present

## 2015-06-15 DIAGNOSIS — M79672 Pain in left foot: Secondary | ICD-10-CM | POA: Diagnosis not present

## 2015-06-15 DIAGNOSIS — R600 Localized edema: Secondary | ICD-10-CM | POA: Diagnosis not present

## 2015-06-15 DIAGNOSIS — I714 Abdominal aortic aneurysm, without rupture: Secondary | ICD-10-CM | POA: Diagnosis not present

## 2015-06-15 DIAGNOSIS — I10 Essential (primary) hypertension: Secondary | ICD-10-CM | POA: Diagnosis not present

## 2015-06-15 DIAGNOSIS — I480 Paroxysmal atrial fibrillation: Secondary | ICD-10-CM | POA: Diagnosis not present

## 2015-06-27 NOTE — Addendum Note (Signed)
Addended by: Mena Goes on: 06/27/2015 04:43 PM   Modules accepted: Orders

## 2015-06-29 DIAGNOSIS — Z85828 Personal history of other malignant neoplasm of skin: Secondary | ICD-10-CM | POA: Diagnosis not present

## 2015-06-29 DIAGNOSIS — B078 Other viral warts: Secondary | ICD-10-CM | POA: Diagnosis not present

## 2015-06-29 DIAGNOSIS — L821 Other seborrheic keratosis: Secondary | ICD-10-CM | POA: Diagnosis not present

## 2015-07-28 ENCOUNTER — Encounter: Payer: Self-pay | Admitting: Cardiovascular Disease

## 2015-07-28 ENCOUNTER — Ambulatory Visit (INDEPENDENT_AMBULATORY_CARE_PROVIDER_SITE_OTHER): Payer: Medicare Other | Admitting: Cardiovascular Disease

## 2015-07-28 VITALS — BP 112/62 | HR 65 | Ht 68.0 in | Wt 197.0 lb

## 2015-07-28 DIAGNOSIS — I480 Paroxysmal atrial fibrillation: Secondary | ICD-10-CM

## 2015-07-28 DIAGNOSIS — I4891 Unspecified atrial fibrillation: Secondary | ICD-10-CM

## 2015-07-28 DIAGNOSIS — I10 Essential (primary) hypertension: Secondary | ICD-10-CM

## 2015-07-28 DIAGNOSIS — I714 Abdominal aortic aneurysm, without rupture, unspecified: Secondary | ICD-10-CM

## 2015-07-28 DIAGNOSIS — R609 Edema, unspecified: Secondary | ICD-10-CM

## 2015-07-28 DIAGNOSIS — R600 Localized edema: Secondary | ICD-10-CM

## 2015-07-28 NOTE — Progress Notes (Signed)
Patient ID: [name], male   DOB: [birth year], 82 y.o.   MRN: [id number]      SUBJECTIVE: The patient presents for routine follow-up. There wa no evidence of coronary artery disease by cardiac catheterization in 2012. Echocardiogram on 10/11/11 showed EF 55-60%. He has chronic lower extremity lymphedema. He has seen vascular. He has been encouraged to wear compression stockings and reduce salt intake. His primary complaint relates to feet problems and is scheduled to see an orthopedic surgeon. Denies exertional chest pain. Uses CPAP for sleep apnea. ECG performed in the office today which I personally interpreted demonstrated sinus rhythm with a nonspecific T wave abnormality.   Review of Systems: As per "subjective", otherwise negative.  Allergies  Allergen Reactions  . Flexeril [Cyclobenzaprine Hcl] Nausea And Vomiting  . Oxycodone-Acetaminophen Nausea And Vomiting  . Percocet [Oxycodone-Acetaminophen] Nausea And Vomiting  . Sulfonamide Derivatives     REACTION: RASH  . Vicodin [Hydrocodone-Acetaminophen] Nausea And Vomiting    Current Outpatient Prescriptions  Medication Sig Dispense Refill  . amLODipine (NORVASC) 5 MG tablet TAKE 1 TABLET BY MOUTH DAILY 30 tablet 6  . aspirin 81 MG EC tablet Take 81 mg by mouth daily.      . Cholecalciferol (VITAMIN D) 400 UNITS capsule Take 800 Units by mouth daily.      . dorzolamide (TRUSOPT) 2 % ophthalmic solution Place 1 drop into the right eye 2 (two) times daily.     Marland Kitchen esomeprazole (NEXIUM) 20 MG capsule Take 20 mg by mouth daily.    . finasteride (PROSCAR) 5 MG tablet Take 5 mg by mouth daily.      Marland Kitchen latanoprost (XALATAN) 0.005 % ophthalmic solution Place 1 drop into both eyes at bedtime.     . Omega-3 Fatty Acids (FISH OIL) 1000 MG CAPS Take 2 capsules by mouth daily.     Marland Kitchen UNABLE TO FIND at bedtime. On CPAP     No current facility-administered medications for this visit.    Past Medical History  Diagnosis Date  . Orthostatic  hypotension   . Atrial fibrillation (HCC)     bicarbonate perfusion study 10/11 EF 69%. small defects no ischemia. 7 metastases acheived. ER visit 11/10/09 ruled out MI normal sinus rhythm orthostasis after sublingual nitroglycerin   . AAA (abdominal aortic aneurysm) ([place])   . Hypertension   . Stress fracture of foot     left  . GERD (gastroesophageal reflux disease)   . BPH (benign prostatic hyperplasia)   . Swelling of both lower extremities   . Sleep apnea     CPAP at night  . PONV (postoperative nausea and vomiting)   . Chronic back pain     Past Surgical History  Procedure Laterality Date  . Shoulder surgery Right 2005?    for open rotator cuff repair. [place]  . Wrist fracture surgery Right   . Colonoscopy N/A 08/27/2012    Procedure: COLONOSCOPY;  Surgeon: Rogene Houston, MD;  Location: [facility] SUITE;  Service: Endoscopy;  Laterality: N/A;  325  . Cholecystectomy  2012    [facility] by Dr. Geroge Baseman  . Hernia repair Bilateral     Inguinal and umbilical  . Incisional hernia repair N/A 01/31/2014    Procedure: Fatima Blank HERNIORRHAPHY WITH MESH;  Surgeon: Jamesetta So, MD;  Location: [facility] ORS;  Service: General;  Laterality: N/A;  . Wound debridement Right 01/31/2014    Procedure: DEBRIDEMENT WOUND RIGHT LEG;  Surgeon: Jamesetta So,  MD;  Location: [facility] ORS;  Service: General;  Laterality: Right;  . Insertion of mesh N/A 01/31/2014    Procedure: INSERTION OF MESH;  Surgeon: Jamesetta So, MD;  Location: [facility] ORS;  Service: General;  Laterality: N/A;    Social History   Social History  . Marital Status: Divorced    Spouse Name: N/A  . Number of Children: N/A  . Years of Education: N/A   Occupational History  . Not on file.   Social History Main Topics  . Smoking status: Former Smoker -- 1.00 packs/day for 10 years    Types: Cigarettes    Start date: 10/15/1943    Quit date: 01/28/1954  . Smokeless tobacco: Never Used     Comment: tobacco use - no  .  Alcohol Use: No  . Drug Use: No  . Sexual Activity: Not on file   Other Topics Concern  . Not on file   Social History Narrative     Filed Vitals:   07/28/15 0924  BP: 112/62  Pulse: 65  Height: 5\' 8"  (1.727 m)  Weight: 197 lb (89.359 kg)  SpO2: 97%    PHYSICAL EXAM General: NAD HEENT: Normal. Neck: No JVD, no thyromegaly. Lungs: Clear to auscultation bilaterally with normal respiratory effort. CV: Nondisplaced PMI. Regular rate and rhythm, normal S1/S2, no S3/S4, no murmur. Trace-1+ pretibial and periankle edema.   Abdomen: Soft, no distention.  Neurologic: Alert and oriented x 3.  Psych: Normal affect. Skin: Normal. Musculoskeletal: No gross deformities. Extremities: No clubbing or cyanosis.    ECG: Most recent ECG reviewed.      ASSESSMENT AND PLAN: 1. PAF: Stable. No anticoagulation at this time as apparently isolated instance.  2. Essential HTN: controlled. No changes.  3. AAA: Followed by vascular. 4.4 cm. Stable.  4. Lymphedema: Wearing compression stockings.  Dispo: fu prn.   Kate Sable, M.D., F.A.C.C.

## 2015-07-28 NOTE — Patient Instructions (Signed)
Your physician recommends that you schedule a follow-up appointment AS NEEDED WITH DR. KONESWARAN  Your physician recommends that you continue on your current medications as directed. Please refer to the Current Medication list given to you today.  Thank you for choosing [facility]!!   

## 2015-08-02 DIAGNOSIS — M79671 Pain in right foot: Secondary | ICD-10-CM | POA: Diagnosis not present

## 2015-08-02 DIAGNOSIS — M19072 Primary osteoarthritis, left ankle and foot: Secondary | ICD-10-CM | POA: Diagnosis not present

## 2015-08-02 DIAGNOSIS — M79672 Pain in left foot: Secondary | ICD-10-CM | POA: Diagnosis not present

## 2015-08-10 ENCOUNTER — Ambulatory Visit (HOSPITAL_COMMUNITY)
Admission: RE | Admit: 2015-08-10 | Discharge: 2015-08-10 | Disposition: A | Discharge status: Home / Self Care | Payer: Medicare Other | Source: Ambulatory Visit | Attending: Urology | Admitting: Urology

## 2015-08-10 DIAGNOSIS — N281 Cyst of kidney, acquired: Secondary | ICD-10-CM | POA: Diagnosis not present

## 2015-08-10 DIAGNOSIS — Z87442 Personal history of urinary calculi: Secondary | ICD-10-CM | POA: Insufficient documentation

## 2015-08-10 DIAGNOSIS — N400 Enlarged prostate without lower urinary tract symptoms: Secondary | ICD-10-CM | POA: Insufficient documentation

## 2015-08-10 DIAGNOSIS — N200 Calculus of kidney: Secondary | ICD-10-CM | POA: Diagnosis present

## 2015-08-10 DIAGNOSIS — Z09 Encounter for follow-up examination after completed treatment for conditions other than malignant neoplasm: Secondary | ICD-10-CM | POA: Diagnosis not present

## 2015-08-18 ENCOUNTER — Ambulatory Visit (INDEPENDENT_AMBULATORY_CARE_PROVIDER_SITE_OTHER): Payer: Medicare Other | Admitting: Urology

## 2015-08-18 DIAGNOSIS — N401 Enlarged prostate with lower urinary tract symptoms: Secondary | ICD-10-CM

## 2015-08-24 DIAGNOSIS — B351 Tinea unguium: Secondary | ICD-10-CM | POA: Diagnosis not present

## 2015-08-24 DIAGNOSIS — I739 Peripheral vascular disease, unspecified: Secondary | ICD-10-CM | POA: Diagnosis not present

## 2015-09-13 ENCOUNTER — Other Ambulatory Visit: Payer: Self-pay | Admitting: Cardiovascular Disease

## 2015-09-27 DIAGNOSIS — M79672 Pain in left foot: Secondary | ICD-10-CM | POA: Diagnosis not present

## 2015-09-27 DIAGNOSIS — M19072 Primary osteoarthritis, left ankle and foot: Secondary | ICD-10-CM | POA: Diagnosis not present

## 2015-09-29 DIAGNOSIS — Z0000 Encounter for general adult medical examination without abnormal findings: Secondary | ICD-10-CM | POA: Diagnosis not present

## 2015-10-24 DIAGNOSIS — H401122 Primary open-angle glaucoma, left eye, moderate stage: Secondary | ICD-10-CM | POA: Diagnosis not present

## 2015-10-24 DIAGNOSIS — H401113 Primary open-angle glaucoma, right eye, severe stage: Secondary | ICD-10-CM | POA: Diagnosis not present

## 2015-11-01 DIAGNOSIS — K219 Gastro-esophageal reflux disease without esophagitis: Secondary | ICD-10-CM | POA: Diagnosis not present

## 2015-11-01 DIAGNOSIS — E559 Vitamin D deficiency, unspecified: Secondary | ICD-10-CM | POA: Diagnosis not present

## 2015-11-01 DIAGNOSIS — E782 Mixed hyperlipidemia: Secondary | ICD-10-CM | POA: Diagnosis not present

## 2015-11-01 DIAGNOSIS — I10 Essential (primary) hypertension: Secondary | ICD-10-CM | POA: Diagnosis not present

## 2015-11-09 ENCOUNTER — Other Ambulatory Visit (HOSPITAL_COMMUNITY): Payer: Self-pay | Admitting: Family Medicine

## 2015-11-09 DIAGNOSIS — E871 Hypo-osmolality and hyponatremia: Secondary | ICD-10-CM | POA: Diagnosis not present

## 2015-11-09 DIAGNOSIS — I5032 Chronic diastolic (congestive) heart failure: Secondary | ICD-10-CM | POA: Diagnosis not present

## 2015-11-09 DIAGNOSIS — I10 Essential (primary) hypertension: Secondary | ICD-10-CM | POA: Diagnosis not present

## 2015-11-09 DIAGNOSIS — M810 Age-related osteoporosis without current pathological fracture: Secondary | ICD-10-CM

## 2015-11-09 DIAGNOSIS — E782 Mixed hyperlipidemia: Secondary | ICD-10-CM | POA: Diagnosis not present

## 2015-11-09 DIAGNOSIS — I480 Paroxysmal atrial fibrillation: Secondary | ICD-10-CM | POA: Diagnosis not present

## 2015-11-09 DIAGNOSIS — I714 Abdominal aortic aneurysm, without rupture: Secondary | ICD-10-CM | POA: Diagnosis not present

## 2015-11-09 DIAGNOSIS — G4733 Obstructive sleep apnea (adult) (pediatric): Secondary | ICD-10-CM | POA: Diagnosis not present

## 2015-11-09 DIAGNOSIS — Z6828 Body mass index (BMI) 28.0-28.9, adult: Secondary | ICD-10-CM | POA: Diagnosis not present

## 2015-11-20 ENCOUNTER — Ambulatory Visit (HOSPITAL_COMMUNITY)
Admission: RE | Admit: 2015-11-20 | Discharge: 2015-11-20 | Disposition: A | Discharge status: Home / Self Care | Payer: Medicare Other | Source: Ambulatory Visit | Attending: Family Medicine | Admitting: Family Medicine

## 2015-11-20 DIAGNOSIS — M810 Age-related osteoporosis without current pathological fracture: Secondary | ICD-10-CM | POA: Insufficient documentation

## 2015-11-22 DIAGNOSIS — I739 Peripheral vascular disease, unspecified: Secondary | ICD-10-CM | POA: Diagnosis not present

## 2015-11-22 DIAGNOSIS — B351 Tinea unguium: Secondary | ICD-10-CM | POA: Diagnosis not present

## 2015-12-15 DIAGNOSIS — M19072 Primary osteoarthritis, left ankle and foot: Secondary | ICD-10-CM | POA: Diagnosis not present

## 2015-12-15 DIAGNOSIS — M79672 Pain in left foot: Secondary | ICD-10-CM | POA: Diagnosis not present

## 2015-12-15 DIAGNOSIS — G8929 Other chronic pain: Secondary | ICD-10-CM | POA: Diagnosis not present

## 2015-12-18 DIAGNOSIS — Z23 Encounter for immunization: Secondary | ICD-10-CM | POA: Diagnosis not present

## 2016-01-01 DIAGNOSIS — L570 Actinic keratosis: Secondary | ICD-10-CM | POA: Diagnosis not present

## 2016-01-01 DIAGNOSIS — C44712 Basal cell carcinoma of skin of right lower limb, including hip: Secondary | ICD-10-CM | POA: Diagnosis not present

## 2016-01-01 DIAGNOSIS — B078 Other viral warts: Secondary | ICD-10-CM | POA: Diagnosis not present

## 2016-01-01 DIAGNOSIS — Z85828 Personal history of other malignant neoplasm of skin: Secondary | ICD-10-CM | POA: Diagnosis not present

## 2016-01-11 DIAGNOSIS — L03115 Cellulitis of right lower limb: Secondary | ICD-10-CM | POA: Diagnosis not present

## 2016-01-11 DIAGNOSIS — Z6828 Body mass index (BMI) 28.0-28.9, adult: Secondary | ICD-10-CM | POA: Diagnosis not present

## 2016-02-07 DIAGNOSIS — B351 Tinea unguium: Secondary | ICD-10-CM | POA: Diagnosis not present

## 2016-02-07 DIAGNOSIS — I739 Peripheral vascular disease, unspecified: Secondary | ICD-10-CM | POA: Diagnosis not present

## 2016-02-23 ENCOUNTER — Ambulatory Visit (INDEPENDENT_AMBULATORY_CARE_PROVIDER_SITE_OTHER): Payer: Medicare Other | Admitting: Urology

## 2016-02-23 DIAGNOSIS — R351 Nocturia: Secondary | ICD-10-CM

## 2016-02-23 DIAGNOSIS — N401 Enlarged prostate with lower urinary tract symptoms: Secondary | ICD-10-CM

## 2016-02-23 DIAGNOSIS — Z87442 Personal history of urinary calculi: Secondary | ICD-10-CM | POA: Diagnosis not present

## 2016-02-28 DIAGNOSIS — Z85828 Personal history of other malignant neoplasm of skin: Secondary | ICD-10-CM | POA: Diagnosis not present

## 2016-02-28 DIAGNOSIS — B078 Other viral warts: Secondary | ICD-10-CM | POA: Diagnosis not present

## 2016-02-28 DIAGNOSIS — L570 Actinic keratosis: Secondary | ICD-10-CM | POA: Diagnosis not present

## 2016-04-10 ENCOUNTER — Other Ambulatory Visit: Payer: Self-pay | Admitting: Cardiovascular Disease

## 2016-04-19 DIAGNOSIS — H401122 Primary open-angle glaucoma, left eye, moderate stage: Secondary | ICD-10-CM | POA: Diagnosis not present

## 2016-04-19 DIAGNOSIS — H401113 Primary open-angle glaucoma, right eye, severe stage: Secondary | ICD-10-CM | POA: Diagnosis not present

## 2016-04-24 DIAGNOSIS — B351 Tinea unguium: Secondary | ICD-10-CM | POA: Diagnosis not present

## 2016-04-24 DIAGNOSIS — I739 Peripheral vascular disease, unspecified: Secondary | ICD-10-CM | POA: Diagnosis not present

## 2016-05-27 DIAGNOSIS — M19072 Primary osteoarthritis, left ankle and foot: Secondary | ICD-10-CM | POA: Diagnosis not present

## 2016-05-27 DIAGNOSIS — M79671 Pain in right foot: Secondary | ICD-10-CM | POA: Diagnosis not present

## 2016-05-29 ENCOUNTER — Other Ambulatory Visit (HOSPITAL_COMMUNITY): Payer: Medicare Other

## 2016-05-29 ENCOUNTER — Ambulatory Visit: Payer: Medicare Other | Admitting: Vascular Surgery

## 2016-06-04 DIAGNOSIS — E871 Hypo-osmolality and hyponatremia: Secondary | ICD-10-CM | POA: Diagnosis not present

## 2016-06-04 DIAGNOSIS — I10 Essential (primary) hypertension: Secondary | ICD-10-CM | POA: Diagnosis not present

## 2016-06-04 DIAGNOSIS — K219 Gastro-esophageal reflux disease without esophagitis: Secondary | ICD-10-CM | POA: Diagnosis not present

## 2016-06-04 DIAGNOSIS — E782 Mixed hyperlipidemia: Secondary | ICD-10-CM | POA: Diagnosis not present

## 2016-06-04 DIAGNOSIS — I480 Paroxysmal atrial fibrillation: Secondary | ICD-10-CM | POA: Diagnosis not present

## 2016-06-05 DIAGNOSIS — M810 Age-related osteoporosis without current pathological fracture: Secondary | ICD-10-CM | POA: Diagnosis not present

## 2016-06-05 DIAGNOSIS — G4733 Obstructive sleep apnea (adult) (pediatric): Secondary | ICD-10-CM | POA: Diagnosis not present

## 2016-06-05 DIAGNOSIS — I480 Paroxysmal atrial fibrillation: Secondary | ICD-10-CM | POA: Diagnosis not present

## 2016-06-05 DIAGNOSIS — I10 Essential (primary) hypertension: Secondary | ICD-10-CM | POA: Diagnosis not present

## 2016-06-05 DIAGNOSIS — I714 Abdominal aortic aneurysm, without rupture: Secondary | ICD-10-CM | POA: Diagnosis not present

## 2016-06-05 DIAGNOSIS — Z0001 Encounter for general adult medical examination with abnormal findings: Secondary | ICD-10-CM | POA: Diagnosis not present

## 2016-06-05 DIAGNOSIS — E782 Mixed hyperlipidemia: Secondary | ICD-10-CM | POA: Diagnosis not present

## 2016-06-05 DIAGNOSIS — I5032 Chronic diastolic (congestive) heart failure: Secondary | ICD-10-CM | POA: Diagnosis not present

## 2016-06-18 DIAGNOSIS — L570 Actinic keratosis: Secondary | ICD-10-CM | POA: Diagnosis not present

## 2016-06-18 DIAGNOSIS — Z85828 Personal history of other malignant neoplasm of skin: Secondary | ICD-10-CM | POA: Diagnosis not present

## 2016-06-20 DIAGNOSIS — M25672 Stiffness of left ankle, not elsewhere classified: Secondary | ICD-10-CM | POA: Diagnosis not present

## 2016-06-20 DIAGNOSIS — M25572 Pain in left ankle and joints of left foot: Secondary | ICD-10-CM | POA: Diagnosis not present

## 2016-06-20 DIAGNOSIS — R2689 Other abnormalities of gait and mobility: Secondary | ICD-10-CM | POA: Diagnosis not present

## 2016-06-26 DIAGNOSIS — M25572 Pain in left ankle and joints of left foot: Secondary | ICD-10-CM | POA: Diagnosis not present

## 2016-06-26 DIAGNOSIS — R2689 Other abnormalities of gait and mobility: Secondary | ICD-10-CM | POA: Diagnosis not present

## 2016-06-26 DIAGNOSIS — M25672 Stiffness of left ankle, not elsewhere classified: Secondary | ICD-10-CM | POA: Diagnosis not present

## 2016-06-27 DIAGNOSIS — R2689 Other abnormalities of gait and mobility: Secondary | ICD-10-CM | POA: Diagnosis not present

## 2016-06-27 DIAGNOSIS — M25672 Stiffness of left ankle, not elsewhere classified: Secondary | ICD-10-CM | POA: Diagnosis not present

## 2016-06-27 DIAGNOSIS — M25572 Pain in left ankle and joints of left foot: Secondary | ICD-10-CM | POA: Diagnosis not present

## 2016-07-03 DIAGNOSIS — I480 Paroxysmal atrial fibrillation: Secondary | ICD-10-CM | POA: Diagnosis not present

## 2016-07-03 DIAGNOSIS — I5032 Chronic diastolic (congestive) heart failure: Secondary | ICD-10-CM | POA: Diagnosis not present

## 2016-07-03 DIAGNOSIS — M25572 Pain in left ankle and joints of left foot: Secondary | ICD-10-CM | POA: Diagnosis not present

## 2016-07-03 DIAGNOSIS — M25672 Stiffness of left ankle, not elsewhere classified: Secondary | ICD-10-CM | POA: Diagnosis not present

## 2016-07-03 DIAGNOSIS — Z6828 Body mass index (BMI) 28.0-28.9, adult: Secondary | ICD-10-CM | POA: Diagnosis not present

## 2016-07-03 DIAGNOSIS — R2689 Other abnormalities of gait and mobility: Secondary | ICD-10-CM | POA: Diagnosis not present

## 2016-07-03 DIAGNOSIS — I714 Abdominal aortic aneurysm, without rupture: Secondary | ICD-10-CM | POA: Diagnosis not present

## 2016-07-03 DIAGNOSIS — J209 Acute bronchitis, unspecified: Secondary | ICD-10-CM | POA: Diagnosis not present

## 2016-07-04 DIAGNOSIS — M25672 Stiffness of left ankle, not elsewhere classified: Secondary | ICD-10-CM | POA: Diagnosis not present

## 2016-07-04 DIAGNOSIS — M25572 Pain in left ankle and joints of left foot: Secondary | ICD-10-CM | POA: Diagnosis not present

## 2016-07-04 DIAGNOSIS — R2689 Other abnormalities of gait and mobility: Secondary | ICD-10-CM | POA: Diagnosis not present

## 2016-07-09 DIAGNOSIS — R2689 Other abnormalities of gait and mobility: Secondary | ICD-10-CM | POA: Diagnosis not present

## 2016-07-09 DIAGNOSIS — M25672 Stiffness of left ankle, not elsewhere classified: Secondary | ICD-10-CM | POA: Diagnosis not present

## 2016-07-09 DIAGNOSIS — M25572 Pain in left ankle and joints of left foot: Secondary | ICD-10-CM | POA: Diagnosis not present

## 2016-07-10 DIAGNOSIS — B351 Tinea unguium: Secondary | ICD-10-CM | POA: Diagnosis not present

## 2016-07-10 DIAGNOSIS — I739 Peripheral vascular disease, unspecified: Secondary | ICD-10-CM | POA: Diagnosis not present

## 2016-07-16 DIAGNOSIS — M25672 Stiffness of left ankle, not elsewhere classified: Secondary | ICD-10-CM | POA: Diagnosis not present

## 2016-07-16 DIAGNOSIS — M25572 Pain in left ankle and joints of left foot: Secondary | ICD-10-CM | POA: Diagnosis not present

## 2016-07-16 DIAGNOSIS — R2689 Other abnormalities of gait and mobility: Secondary | ICD-10-CM | POA: Diagnosis not present

## 2016-07-18 DIAGNOSIS — R2689 Other abnormalities of gait and mobility: Secondary | ICD-10-CM | POA: Diagnosis not present

## 2016-07-18 DIAGNOSIS — M25572 Pain in left ankle and joints of left foot: Secondary | ICD-10-CM | POA: Diagnosis not present

## 2016-07-18 DIAGNOSIS — M25672 Stiffness of left ankle, not elsewhere classified: Secondary | ICD-10-CM | POA: Diagnosis not present

## 2016-07-23 DIAGNOSIS — R2689 Other abnormalities of gait and mobility: Secondary | ICD-10-CM | POA: Diagnosis not present

## 2016-07-23 DIAGNOSIS — M25672 Stiffness of left ankle, not elsewhere classified: Secondary | ICD-10-CM | POA: Diagnosis not present

## 2016-07-23 DIAGNOSIS — M25572 Pain in left ankle and joints of left foot: Secondary | ICD-10-CM | POA: Diagnosis not present

## 2016-07-25 DIAGNOSIS — M25672 Stiffness of left ankle, not elsewhere classified: Secondary | ICD-10-CM | POA: Diagnosis not present

## 2016-07-25 DIAGNOSIS — R2689 Other abnormalities of gait and mobility: Secondary | ICD-10-CM | POA: Diagnosis not present

## 2016-07-25 DIAGNOSIS — M25572 Pain in left ankle and joints of left foot: Secondary | ICD-10-CM | POA: Diagnosis not present

## 2016-07-29 ENCOUNTER — Encounter: Payer: Self-pay | Admitting: Vascular Surgery

## 2016-07-30 DIAGNOSIS — M25572 Pain in left ankle and joints of left foot: Secondary | ICD-10-CM | POA: Diagnosis not present

## 2016-07-30 DIAGNOSIS — M25672 Stiffness of left ankle, not elsewhere classified: Secondary | ICD-10-CM | POA: Diagnosis not present

## 2016-07-30 DIAGNOSIS — R2689 Other abnormalities of gait and mobility: Secondary | ICD-10-CM | POA: Diagnosis not present

## 2016-08-01 DIAGNOSIS — M25672 Stiffness of left ankle, not elsewhere classified: Secondary | ICD-10-CM | POA: Diagnosis not present

## 2016-08-01 DIAGNOSIS — R2689 Other abnormalities of gait and mobility: Secondary | ICD-10-CM | POA: Diagnosis not present

## 2016-08-01 DIAGNOSIS — M25572 Pain in left ankle and joints of left foot: Secondary | ICD-10-CM | POA: Diagnosis not present

## 2016-08-06 DIAGNOSIS — M25572 Pain in left ankle and joints of left foot: Secondary | ICD-10-CM | POA: Diagnosis not present

## 2016-08-06 DIAGNOSIS — M25672 Stiffness of left ankle, not elsewhere classified: Secondary | ICD-10-CM | POA: Diagnosis not present

## 2016-08-06 DIAGNOSIS — R2689 Other abnormalities of gait and mobility: Secondary | ICD-10-CM | POA: Diagnosis not present

## 2016-08-08 ENCOUNTER — Ambulatory Visit (INDEPENDENT_AMBULATORY_CARE_PROVIDER_SITE_OTHER): Payer: Medicare Other | Admitting: Vascular Surgery

## 2016-08-08 ENCOUNTER — Ambulatory Visit (HOSPITAL_COMMUNITY)
Admission: RE | Admit: 2016-08-08 | Discharge: 2016-08-08 | Disposition: A | Discharge status: Home / Self Care | Payer: Medicare Other | Source: Ambulatory Visit | Attending: Vascular Surgery | Admitting: Vascular Surgery

## 2016-08-08 ENCOUNTER — Encounter: Payer: Self-pay | Admitting: Vascular Surgery

## 2016-08-08 VITALS — BP 138/84 | HR 48 | Temp 97.4°F | Resp 20 | Ht 68.0 in | Wt 185.9 lb

## 2016-08-08 DIAGNOSIS — I714 Abdominal aortic aneurysm, without rupture, unspecified: Secondary | ICD-10-CM

## 2016-08-08 DIAGNOSIS — I890 Lymphedema, not elsewhere classified: Secondary | ICD-10-CM | POA: Diagnosis not present

## 2016-08-08 NOTE — Progress Notes (Signed)
Patient name: [name] MRN: [id number] DOB: 1926/07/16 Sex: male  REASON FOR VISIT:    Follow up of abdominal aortic aneurysm.  HPI:   [name] is a pleasant 83 y.o. male who presents for a one-year follow up visit. I have been following her with an abdominal aortic aneurysm. I last saw her on 05/24/2015. At the time of her last visit the maximum diameter of her aneurysm was 4.4 cm. The right common iliac artery measured 1.3 cm in maximum diameter and the left common iliac artery 1.4 cm in maximum diameter. The patient denies any abdominal pain. He does occasionally get some low back pain which is chronic. He is not a smoker. He quit in 1956.  The patient also had bilateral lower extremity swelling. On his last visit and I felt that he likely had lymphedema. We discussed the importance of intermittent leg elevation and potentially using compression stockings. He states that he does try to elevate his legs intermittently he does not like to wear compression stockings although he does wear him to church on Sundays. His leg swelling has been stable and has not progressed significantly.  Past Medical History:  Diagnosis Date  . AAA (abdominal aortic aneurysm) ([place])   . Atrial fibrillation (HCC)    bicarbonate perfusion study 10/11 EF 69%. small defects no ischemia. 7 metastases acheived. ER visit 11/10/09 ruled out MI normal sinus rhythm orthostasis after sublingual nitroglycerin   . BPH (benign prostatic hyperplasia)   . Chronic back pain   . GERD (gastroesophageal reflux disease)   . Hypertension   . Orthostatic hypotension   . PONV (postoperative nausea and vomiting)   . Sleep apnea    CPAP at night  . Stress fracture of foot    left  . Swelling of both lower extremities     Family History  Problem Relation Age of Onset  . Heart failure Mother   . Heart failure Father   . Cancer Sister     SOCIAL HISTORY: Social History  Substance Use Topics  . Smoking status:  Former Smoker    Packs/day: 1.00    Years: 10.00    Types: Cigarettes    Start date: 10/15/1943    Quit date: 01/28/1954  . Smokeless tobacco: Never Used     Comment: tobacco use - no  . Alcohol use No    Allergies  Allergen Reactions  . Flexeril [Cyclobenzaprine Hcl] Nausea And Vomiting  . Oxycodone-Acetaminophen Nausea And Vomiting  . Percocet [Oxycodone-Acetaminophen] Nausea And Vomiting  . Sulfonamide Derivatives     REACTION: RASH  . Vicodin [Hydrocodone-Acetaminophen] Nausea And Vomiting    Current Outpatient Prescriptions  Medication Sig Dispense Refill  . amLODipine (NORVASC) 5 MG tablet TAKE 1 TABLET BY MOUTH DAILY 30 tablet 6  . aspirin 81 MG EC tablet Take 81 mg by mouth daily.      . Cholecalciferol (VITAMIN D) 400 UNITS capsule Take 800 Units by mouth daily.      . dorzolamide (TRUSOPT) 2 % ophthalmic solution Place 1 drop into the right eye 2 (two) times daily.     Marland Kitchen esomeprazole (NEXIUM) 20 MG capsule Take 20 mg by mouth daily.    . finasteride (PROSCAR) 5 MG tablet Take 5 mg by mouth daily.      Marland Kitchen latanoprost (XALATAN) 0.005 % ophthalmic solution Place 1 drop into both eyes at bedtime.     . Omega-3 Fatty Acids (FISH OIL) 1000 MG CAPS Take 2 capsules  by mouth daily.     Marland Kitchen UNABLE TO FIND at bedtime. On CPAP     No current facility-administered medications for this visit.     REVIEW OF SYSTEMS:  [X]  denotes positive finding, [ ]  denotes negative finding Cardiac  Comments:  Chest pain or chest pressure:    Shortness of breath upon exertion:    Short of breath when lying flat:    Irregular heart rhythm:        Vascular    Pain in calf, thigh, or hip brought on by ambulation:    Pain in feet at night that wakes you up from your sleep:     Blood clot in your veins:    Leg swelling:         Pulmonary    Oxygen at home:    Productive cough:     Wheezing:         Neurologic    Sudden weakness in arms or legs:     Sudden numbness in arms or legs:       Sudden onset of difficulty speaking or slurred speech:    Temporary loss of vision in one eye:     Problems with dizziness:         Gastrointestinal    Blood in stool:     Vomited blood:         Genitourinary    Burning when urinating:     Blood in urine:        Psychiatric    Major depression:         Hematologic    Bleeding problems:    Problems with blood clotting too easily:        Skin    Rashes or ulcers:        Constitutional    Fever or chills:     PHYSICAL EXAM:   Vitals:   08/08/16 0929  BP: 138/84  Pulse: (!) 48  Resp: 20  Temp: (!) 97.4 F (36.3 C)  TempSrc: Oral  SpO2: 95%  Weight: 185 lb 14.4 oz (84.3 kg)  Height: 5\' 8"  (1.727 m)    GENERAL: The patient is a well-nourished male, in no acute distress. The vital signs are documented above. CARDIAC: There is a regular rate and rhythm.  VASCULAR: I do not detect carotid bruits. He has palpable femoral and popliteal pulses. I cannot palpate pedal pulses however both feet are warm and well-perfused. He does have some lower extreme swelling bilaterally and I think he has combined chronic venous insufficiency and lymphedema. He has some telangiectasias and spider veins bilaterally. PULMONARY: There is good air exchange bilaterally without wheezing or rales. ABDOMEN: Soft and non-tender with normal pitched bowel sounds. His aneurysm is nontender. MUSCULOSKELETAL: There are no major deformities or cyanosis. NEUROLOGIC: No focal weakness or paresthesias are detected. SKIN: There are no ulcers or rashes noted. PSYCHIATRIC: The patient has a normal affect.  DATA:    DUPLEX ABDOMINAL AORTA: I have independently interpreted the duplex of the abdominal aorta that was done today. The maximum diameter of her aneurysm is 4.4 cm. The right common iliac artery measures 1.3 cm in maximum diameter. The left common iliac artery measures 1.4 cm in maximum diameter.  MEDICAL ISSUES:   ABDOMINAL AORTIC ANEURYSM: This  patient has a 4.4 cm infrarenal abdominal aortic aneurysm. This is not changed in the last year. He understands we would not consider elective repair unless the aneurysm reached 5.5 cm in maximum diameter.  Given the size of the aneurysm, I think we can continue his follow up at 1 year. I have ordered a follow up duplex scan in 1 year and I'll see him back at that time. He knows to call sooner if he has problems. He is not a smoker. He is on aspirin and is on a statin.  LYMPHEDEMA: His bilateral lower extremity swelling has been stable. He has been elevating his legs. He occasionally wears his compression stockings. I think the swelling can also partially be attributed to some chronic venous insufficiency given that he does have some spider veins and telangiectasias bilaterally. He also has some hyperpigmentation. I've encouraged to continue to elevate his legs.  HYPERTENSION: His blood pressure appears to be under good control.  Deitra Mayo [facility] [phone]

## 2016-08-12 NOTE — Addendum Note (Signed)
Addended by: Lianne Cure A on: 08/12/2016 03:38 PM   Modules accepted: Orders

## 2016-08-14 DIAGNOSIS — R2689 Other abnormalities of gait and mobility: Secondary | ICD-10-CM | POA: Diagnosis not present

## 2016-08-14 DIAGNOSIS — M25572 Pain in left ankle and joints of left foot: Secondary | ICD-10-CM | POA: Diagnosis not present

## 2016-08-14 DIAGNOSIS — M25672 Stiffness of left ankle, not elsewhere classified: Secondary | ICD-10-CM | POA: Diagnosis not present

## 2016-08-20 DIAGNOSIS — M25672 Stiffness of left ankle, not elsewhere classified: Secondary | ICD-10-CM | POA: Diagnosis not present

## 2016-08-20 DIAGNOSIS — R2689 Other abnormalities of gait and mobility: Secondary | ICD-10-CM | POA: Diagnosis not present

## 2016-08-20 DIAGNOSIS — M25572 Pain in left ankle and joints of left foot: Secondary | ICD-10-CM | POA: Diagnosis not present

## 2016-09-25 ENCOUNTER — Ambulatory Visit (INDEPENDENT_AMBULATORY_CARE_PROVIDER_SITE_OTHER): Payer: Medicare Other | Admitting: Cardiovascular Disease

## 2016-09-25 ENCOUNTER — Encounter: Payer: Self-pay | Admitting: Cardiovascular Disease

## 2016-09-25 VITALS — BP 100/60 | HR 51 | Ht 68.0 in | Wt 189.0 lb

## 2016-09-25 DIAGNOSIS — I714 Abdominal aortic aneurysm, without rupture, unspecified: Secondary | ICD-10-CM

## 2016-09-25 DIAGNOSIS — R5383 Other fatigue: Secondary | ICD-10-CM | POA: Diagnosis not present

## 2016-09-25 DIAGNOSIS — R0602 Shortness of breath: Secondary | ICD-10-CM | POA: Diagnosis not present

## 2016-09-25 DIAGNOSIS — B351 Tinea unguium: Secondary | ICD-10-CM | POA: Diagnosis not present

## 2016-09-25 DIAGNOSIS — I10 Essential (primary) hypertension: Secondary | ICD-10-CM | POA: Diagnosis not present

## 2016-09-25 DIAGNOSIS — I480 Paroxysmal atrial fibrillation: Secondary | ICD-10-CM

## 2016-09-25 DIAGNOSIS — R531 Weakness: Secondary | ICD-10-CM

## 2016-09-25 DIAGNOSIS — I739 Peripheral vascular disease, unspecified: Secondary | ICD-10-CM | POA: Diagnosis not present

## 2016-09-25 DIAGNOSIS — R001 Bradycardia, unspecified: Secondary | ICD-10-CM

## 2016-09-25 NOTE — Patient Instructions (Signed)
Medication Instructions:  Your physician recommends that you continue on your current medications as directed. Please refer to the Current Medication list given to you today.  Labwork: CBC, CMET, TSH, CK- Orders given today  Testing/Procedures: none  Follow-Up: Your physician recommends that you schedule a follow-up appointment in: [place] [facility]   Any Other Special Instructions Will Be Listed Below (If Applicable).  If you need a refill on your cardiac medications before your next appointment, please call your pharmacy.

## 2016-09-25 NOTE — Progress Notes (Signed)
SUBJECTIVE: The patient presents for routine follow-up. There wa no evidence of coronary artery disease by cardiac catheterization in 2012. Echocardiogram on 10/11/11 showed EF 55-60%. He has chronic lower extremity lymphedema. He has seen vascular. He has been encouraged to wear compression stockings and reduce salt intake.   Abdominal aortic aneurysm ultrasound on 08/08/16 showed maximal diameter of 4.4 cm.  When I saw him on 07/28/15 I instructed him to follow-up with me as needed.  ECG performed in the office today which I ordered and personally interpreted demonstrated sinus bradycardia with a nonspecific T wave abnormality.  He was started on Lipitor 10 mg in June. Since that time he has been short of breath and has experienced generalized weakness and fatigue. He is short of breath walking his dog. He has had episodic dizziness and lightheadedness in the past week but denies falls and denies syncope. He denies chest pain.   He quit taking Lipitor 2 nights ago and has already begun feeling better.  Of note, he is also taking amlodipine.     Review of Systems: As per "subjective", otherwise negative.  Allergies  Allergen Reactions  . Flexeril [Cyclobenzaprine Hcl] Nausea And Vomiting  . Oxycodone-Acetaminophen Nausea And Vomiting  . Percocet [Oxycodone-Acetaminophen] Nausea And Vomiting  . Sulfonamide Derivatives     REACTION: RASH  . Vicodin [Hydrocodone-Acetaminophen] Nausea And Vomiting    Current Outpatient Prescriptions  Medication Sig Dispense Refill  . alfuzosin (UROXATRAL) 10 MG 24 hr tablet Take 10 mg by mouth daily with breakfast.    . amLODipine (NORVASC) 5 MG tablet TAKE 1 TABLET BY MOUTH DAILY 30 tablet 6  . aspirin 81 MG EC tablet Take 81 mg by mouth daily.      . Cholecalciferol (VITAMIN D) 400 UNITS capsule Take 800 Units by mouth daily.      . dorzolamide (TRUSOPT) 2 % ophthalmic solution Place 1 drop into the right eye 2 (two) times daily.     Marland Kitchen  esomeprazole (NEXIUM) 20 MG capsule Take 20 mg by mouth daily.    . finasteride (PROSCAR) 5 MG tablet Take 5 mg by mouth daily.      Marland Kitchen latanoprost (XALATAN) 0.005 % ophthalmic solution Place 1 drop into both eyes at bedtime.     . Omega-3 Fatty Acids (FISH OIL) 1000 MG CAPS Take 2 capsules by mouth daily.     Marland Kitchen UNABLE TO FIND at bedtime. On CPAP     No current facility-administered medications for this visit.     Past Medical History:  Diagnosis Date  . AAA (abdominal aortic aneurysm) ([place])   . Atrial fibrillation (HCC)    bicarbonate perfusion study 10/11 EF 69%. small defects no ischemia. 7 metastases acheived. ER visit 11/10/09 ruled out MI normal sinus rhythm orthostasis after sublingual nitroglycerin   . BPH (benign prostatic hyperplasia)   . Chronic back pain   . GERD (gastroesophageal reflux disease)   . Hypertension   . Orthostatic hypotension   . PONV (postoperative nausea and vomiting)   . Sleep apnea    CPAP at night  . Stress fracture of foot    left  . Swelling of both lower extremities     Past Surgical History:  Procedure Laterality Date  . CHOLECYSTECTOMY  2012   [facility] by Dr. Geroge Baseman  . COLONOSCOPY N/A 08/27/2012   Procedure: COLONOSCOPY;  Surgeon: Rogene Houston, MD;  Location: [facility];  Service: Endoscopy;  Laterality: N/A;  325  .  HERNIA REPAIR Bilateral    Inguinal and umbilical  . INCISIONAL HERNIA REPAIR N/A 01/31/2014   Procedure: Fatima Blank HERNIORRHAPHY WITH MESH;  Surgeon: Jamesetta So, MD;  Location: [facility] ORS;  Service: General;  Laterality: N/A;  . INSERTION OF MESH N/A 01/31/2014   Procedure: INSERTION OF MESH;  Surgeon: Jamesetta So, MD;  Location: [facility] ORS;  Service: General;  Laterality: N/A;  . SHOULDER SURGERY Right 2005?   for open rotator cuff repair. [place]  . WOUND DEBRIDEMENT Right 01/31/2014   Procedure: DEBRIDEMENT WOUND RIGHT LEG;  Surgeon: Jamesetta So, MD;  Location: [facility] ORS;  Service: General;   Laterality: Right;  . WRIST FRACTURE SURGERY Right     Social History   Social History  . Marital status: Divorced    Spouse name: N/A  . Number of children: N/A  . Years of education: N/A   Occupational History  . Not on file.   Social History Main Topics  . Smoking status: Former Smoker    Packs/day: 1.00    Years: 10.00    Types: Cigarettes    Start date: 10/15/1943    Quit date: 01/28/1954  . Smokeless tobacco: Never Used     Comment: tobacco use - no  . Alcohol use No  . Drug use: No  . Sexual activity: Not on file   Other Topics Concern  . Not on file   Social History Narrative  . No narrative on file     Vitals:   09/25/16 1123  BP: 100/60  Pulse: (!) 51  SpO2: 96%  Weight: 189 lb (85.7 kg)  Height: 5\' 8"  (1.727 m)    Wt Readings from Last 3 Encounters:  09/25/16 189 lb (85.7 kg)  08/08/16 185 lb 14.4 oz (84.3 kg)  07/28/15 197 lb (89.4 kg)     PHYSICAL EXAM General: NAD HEENT: Normal. Neck: No JVD, no thyromegaly. Lungs: Clear to auscultation bilaterally with normal respiratory effort. CV: Nondisplaced PMI.  Bradycardic, regular rhythm, normal S1/S2, no S3/S4, no murmur. No pretibial or periankle edema.  No carotid bruit.   Abdomen: Soft, nontender, no distention.  Neurologic: Alert and oriented.  Psych: Normal affect. Skin: Normal. Musculoskeletal: No gross deformities.    ECG: Most recent ECG reviewed.   Labs: Lab Results  Component Value Date/Time   K 3.7 11/19/2014 06:04 AM   BUN 6 11/19/2014 06:04 AM   CREATININE 0.85 11/19/2014 06:04 AM   CREATININE 0.91 08/12/2014 08:25 AM   ALT 14 (L) 11/18/2014 07:13 AM   HGB 12.2 (L) 11/19/2014 06:04 AM     Lipids: No results found for: LDLCALC, LDLDIRECT, CHOL, TRIG, HDL     ASSESSMENT AND PLAN:  1. PAF: Stable. No anticoagulation at this time as apparently isolated instance.  2. Essential HTN: controlled. No changes.  3. AAA: Followed by vascular. 4.4 cm. Stable.  4.  Lymphedema: Wearing compression stockings.  5. Generalized weakness, fatigue, and shortness of breath: Symptoms have begun improving after stopping Lipitor 2 days ago. I will check a total CK, CBC, complete metabolic panel, and TSH.  6. Bradycardia: I will check a TSH. I will follow-up with him in 6 weeks. If blood tests are unremarkable and he continues to experience symptoms, I may consider cardiac monitoring to evaluate for symptomatic bradycardia.    Disposition: Follow up 6 weeks   Kate Sable, M.D., F.A.C.C.

## 2016-10-09 DIAGNOSIS — C4441 Basal cell carcinoma of skin of scalp and neck: Secondary | ICD-10-CM | POA: Diagnosis not present

## 2016-10-09 DIAGNOSIS — Z85828 Personal history of other malignant neoplasm of skin: Secondary | ICD-10-CM | POA: Diagnosis not present

## 2016-10-09 DIAGNOSIS — L570 Actinic keratosis: Secondary | ICD-10-CM | POA: Diagnosis not present

## 2016-10-14 ENCOUNTER — Telehealth: Payer: Self-pay | Admitting: Cardiovascular Disease

## 2016-10-14 NOTE — Telephone Encounter (Signed)
Please see labs scanned in dated 09/25/2016.  Please advise on results.

## 2016-10-14 NOTE — Telephone Encounter (Signed)
Patient notified.  Copy to pmd.   

## 2016-10-14 NOTE — Telephone Encounter (Signed)
All normal.

## 2016-10-14 NOTE — Telephone Encounter (Signed)
Left message to return call 

## 2016-10-14 NOTE — Telephone Encounter (Signed)
Patient asking for lab results.

## 2016-10-15 ENCOUNTER — Ambulatory Visit (INDEPENDENT_AMBULATORY_CARE_PROVIDER_SITE_OTHER): Payer: Medicare Other | Admitting: Urology

## 2016-10-15 DIAGNOSIS — N401 Enlarged prostate with lower urinary tract symptoms: Secondary | ICD-10-CM | POA: Diagnosis not present

## 2016-10-21 DIAGNOSIS — C4441 Basal cell carcinoma of skin of scalp and neck: Secondary | ICD-10-CM | POA: Diagnosis not present

## 2016-10-25 DIAGNOSIS — H401113 Primary open-angle glaucoma, right eye, severe stage: Secondary | ICD-10-CM | POA: Diagnosis not present

## 2016-10-25 DIAGNOSIS — Z961 Presence of intraocular lens: Secondary | ICD-10-CM | POA: Diagnosis not present

## 2016-10-25 DIAGNOSIS — H401122 Primary open-angle glaucoma, left eye, moderate stage: Secondary | ICD-10-CM | POA: Diagnosis not present

## 2016-10-25 DIAGNOSIS — D2312 Other benign neoplasm of skin of left eyelid, including canthus: Secondary | ICD-10-CM | POA: Diagnosis not present

## 2016-11-12 ENCOUNTER — Other Ambulatory Visit: Payer: Self-pay | Admitting: Cardiovascular Disease

## 2016-11-13 ENCOUNTER — Ambulatory Visit (INDEPENDENT_AMBULATORY_CARE_PROVIDER_SITE_OTHER): Payer: Medicare Other | Admitting: Cardiovascular Disease

## 2016-11-13 ENCOUNTER — Encounter: Payer: Self-pay | Admitting: Cardiovascular Disease

## 2016-11-13 VITALS — BP 122/74 | HR 47 | Ht 68.0 in | Wt 190.0 lb

## 2016-11-13 DIAGNOSIS — I714 Abdominal aortic aneurysm, without rupture, unspecified: Secondary | ICD-10-CM

## 2016-11-13 DIAGNOSIS — I480 Paroxysmal atrial fibrillation: Secondary | ICD-10-CM | POA: Diagnosis not present

## 2016-11-13 DIAGNOSIS — R531 Weakness: Secondary | ICD-10-CM | POA: Diagnosis not present

## 2016-11-13 DIAGNOSIS — R0602 Shortness of breath: Secondary | ICD-10-CM | POA: Diagnosis not present

## 2016-11-13 DIAGNOSIS — R5383 Other fatigue: Secondary | ICD-10-CM

## 2016-11-13 DIAGNOSIS — I10 Essential (primary) hypertension: Secondary | ICD-10-CM

## 2016-11-13 DIAGNOSIS — R001 Bradycardia, unspecified: Secondary | ICD-10-CM

## 2016-11-13 DIAGNOSIS — R600 Localized edema: Secondary | ICD-10-CM | POA: Diagnosis not present

## 2016-11-13 NOTE — Progress Notes (Signed)
SUBJECTIVE: The patient presents for follow-up of generalized weakness, fatigue, and shortness of breath. I ordered a series of labs at his last visit on 09/25/16 including CK, CBC, complete metabolic panel, and TSH. All were normal.  He continues to feel short of breath and fatigue. He denies chest pain, lightheadedness, dizziness, syncope, and falls. He experiences shortness of breath when walking his dog and picking up droppings.  Review of Systems: As per "subjective", otherwise negative.  Allergies  Allergen Reactions  . Flexeril [Cyclobenzaprine Hcl] Nausea And Vomiting  . Oxycodone-Acetaminophen Nausea And Vomiting  . Percocet [Oxycodone-Acetaminophen] Nausea And Vomiting  . Sulfonamide Derivatives     REACTION: RASH  . Vicodin [Hydrocodone-Acetaminophen] Nausea And Vomiting    Current Outpatient Prescriptions  Medication Sig Dispense Refill  . alfuzosin (UROXATRAL) 10 MG 24 hr tablet Take 10 mg by mouth daily with breakfast.    . amLODipine (NORVASC) 5 MG tablet TAKE 1 TABLET BY MOUTH DAILY 30 tablet 3  . aspirin 81 MG EC tablet Take 81 mg by mouth daily.      . Cholecalciferol (VITAMIN D) 400 UNITS capsule Take 800 Units by mouth daily.      . dorzolamide (TRUSOPT) 2 % ophthalmic solution Place 1 drop into the right eye 2 (two) times daily.     Marland Kitchen esomeprazole (NEXIUM) 20 MG capsule Take 20 mg by mouth daily.    . finasteride (PROSCAR) 5 MG tablet Take 5 mg by mouth daily.      Marland Kitchen latanoprost (XALATAN) 0.005 % ophthalmic solution Place 1 drop into both eyes at bedtime.     . Omega-3 Fatty Acids (FISH OIL) 1000 MG CAPS Take 2 capsules by mouth daily.     Marland Kitchen UNABLE TO FIND at bedtime. On CPAP     No current facility-administered medications for this visit.     Past Medical History:  Diagnosis Date  . AAA (abdominal aortic aneurysm) ([place])   . Atrial fibrillation (HCC)    bicarbonate perfusion study 10/11 EF 69%. small defects no ischemia. 7 metastases acheived. ER  visit 11/10/09 ruled out MI normal sinus rhythm orthostasis after sublingual nitroglycerin   . BPH (benign prostatic hyperplasia)   . Chronic back pain   . GERD (gastroesophageal reflux disease)   . Hypertension   . Orthostatic hypotension   . PONV (postoperative nausea and vomiting)   . Sleep apnea    CPAP at night  . Stress fracture of foot    left  . Swelling of both lower extremities     Past Surgical History:  Procedure Laterality Date  . CHOLECYSTECTOMY  2012   [facility] by Dr. Geroge Baseman  . COLONOSCOPY N/A 08/27/2012   Procedure: COLONOSCOPY;  Surgeon: Rogene Houston, MD;  [facility];  Service: Endoscopy;  Laterality: N/A;  325  . HERNIA REPAIR Bilateral    Inguinal and umbilical  . INCISIONAL HERNIA REPAIR N/A 01/31/2014   Procedure: Fatima Blank HERNIORRHAPHY WITH MESH;  Surgeon: Jamesetta So, MD;  Location: [facility];  Service: General;  Laterality: N/A;  . INSERTION OF MESH N/A 01/31/2014   Procedure: INSERTION OF MESH;  Surgeon: Jamesetta So, MD;  Location: [facility];  Service: General;  Laterality: N/A;  . SHOULDER SURGERY Right 2005?   for open rotator cuff repair. [place]  . WOUND DEBRIDEMENT Right 01/31/2014   Procedure: DEBRIDEMENT WOUND RIGHT LEG;  Surgeon: Jamesetta So, MD;  Location: [facility];  Service:  General;  Laterality: Right;  . WRIST FRACTURE SURGERY Right     Social History   Social History  . Marital status: Divorced    Spouse name: N/A  . Number of children: N/A  . Years of education: N/A   Occupational History  . Not on file.   Social History Main Topics  . Smoking status: Former Smoker    Packs/day: 1.00    Years: 10.00    Types: Cigarettes    Start date: 10/15/1943    Quit date: 01/28/1954  . Smokeless tobacco: Never Used     Comment: tobacco use - no  . Alcohol use No  . Drug use: No  . Sexual activity: Not on file   Other Topics Concern  . Not on file   Social History Narrative  . No narrative on file      Vitals:   11/13/16 1045  BP: 122/74  Pulse: (!) 47  SpO2: 95%  Weight: 190 lb (86.2 kg)  Height: 5\' 8"  (1.727 m)    Wt Readings from Last 3 Encounters:  11/13/16 190 lb (86.2 kg)  09/25/16 189 lb (85.7 kg)  08/08/16 185 lb 14.4 oz (84.3 kg)     PHYSICAL EXAM General: NAD HEENT: Normal. Neck: No JVD, no thyromegaly. Lungs: Clear to auscultation bilaterally with normal respiratory effort. CV: Bradycardic (58 bpm by auscultation), regular rhythm, normal S1/S2, no S3/S4, no murmur. Trace bilateral edema.  Abdomen: Soft, nontender, no distention.  Neurologic: Alert and oriented.  Psych: Normal affect. Skin: Normal. Musculoskeletal: No gross deformities.    ECG: Most recent ECG reviewed.   Labs: Lab Results  Component Value Date/Time   K 3.7 11/19/2014 06:04 AM   BUN 6 11/19/2014 06:04 AM   CREATININE 0.85 11/19/2014 06:04 AM   CREATININE 0.91 08/12/2014 08:25 AM   ALT 14 (L) 11/18/2014 07:13 AM   HGB 12.2 (L) 11/19/2014 06:04 AM     Lipids: No results found for: LDLCALC, LDLDIRECT, CHOL, TRIG, HDL     ASSESSMENT AND PLAN: 1. Bradycardia with shortness of breath and fatigue: I will obtain a two-week event monitor to see if he has significant symptomatic bradycardia.  2. Hypertension: Controlled. No changes to therapy.  3. Abdominal aortic aneurysm: 4.4 cm. Stable. Followed by vascular.  4. Lymphedema: Compression stockings recommended.      Disposition: Follow up 2 months   Kate Sable, M.D., F.A.C.C.

## 2016-11-13 NOTE — Patient Instructions (Signed)
Medication Instructions:  Continue all current medications.  Labwork: none  Testing/Procedures:  Your physician has recommended that you wear a 14 day event monitor. Event monitors are medical devices that record the heart's electrical activity. Doctors most often [place] these monitors to diagnose arrhythmias. Arrhythmias are problems with the speed or rhythm of the heartbeat. The monitor is a small, portable device. You can wear one while you do your normal daily activities. This is usually used to diagnose what is causing palpitations/syncope (passing out).  Office will contact with results via phone or letter.    Follow-Up: 2 months   Any Other Special Instructions Will Be Listed Below (If Applicable).  If you need a refill on your cardiac medications before your next appointment, please call your pharmacy.

## 2016-11-18 DIAGNOSIS — R0789 Other chest pain: Secondary | ICD-10-CM | POA: Diagnosis not present

## 2016-11-18 DIAGNOSIS — E782 Mixed hyperlipidemia: Secondary | ICD-10-CM | POA: Diagnosis not present

## 2016-11-18 DIAGNOSIS — Z6828 Body mass index (BMI) 28.0-28.9, adult: Secondary | ICD-10-CM | POA: Diagnosis not present

## 2016-11-18 DIAGNOSIS — I10 Essential (primary) hypertension: Secondary | ICD-10-CM | POA: Diagnosis not present

## 2016-11-18 DIAGNOSIS — I5032 Chronic diastolic (congestive) heart failure: Secondary | ICD-10-CM | POA: Diagnosis not present

## 2016-11-18 DIAGNOSIS — K219 Gastro-esophageal reflux disease without esophagitis: Secondary | ICD-10-CM | POA: Diagnosis not present

## 2016-11-18 DIAGNOSIS — I480 Paroxysmal atrial fibrillation: Secondary | ICD-10-CM | POA: Diagnosis not present

## 2016-11-18 DIAGNOSIS — I714 Abdominal aortic aneurysm, without rupture: Secondary | ICD-10-CM | POA: Diagnosis not present

## 2016-11-20 ENCOUNTER — Ambulatory Visit (INDEPENDENT_AMBULATORY_CARE_PROVIDER_SITE_OTHER): Payer: Medicare Other

## 2016-11-20 DIAGNOSIS — R001 Bradycardia, unspecified: Secondary | ICD-10-CM | POA: Diagnosis not present

## 2016-11-20 DIAGNOSIS — R0602 Shortness of breath: Secondary | ICD-10-CM

## 2016-11-25 DIAGNOSIS — S0100XD Unspecified open wound of scalp, subsequent encounter: Secondary | ICD-10-CM | POA: Diagnosis not present

## 2016-11-28 ENCOUNTER — Other Ambulatory Visit: Payer: Self-pay | Admitting: *Deleted

## 2016-11-28 ENCOUNTER — Telehealth: Payer: Self-pay | Admitting: *Deleted

## 2016-11-28 DIAGNOSIS — I10 Essential (primary) hypertension: Secondary | ICD-10-CM | POA: Diagnosis not present

## 2016-11-28 DIAGNOSIS — G4733 Obstructive sleep apnea (adult) (pediatric): Secondary | ICD-10-CM | POA: Diagnosis not present

## 2016-11-28 DIAGNOSIS — K219 Gastro-esophageal reflux disease without esophagitis: Secondary | ICD-10-CM | POA: Diagnosis not present

## 2016-11-28 DIAGNOSIS — M810 Age-related osteoporosis without current pathological fracture: Secondary | ICD-10-CM | POA: Diagnosis not present

## 2016-11-28 DIAGNOSIS — I714 Abdominal aortic aneurysm, without rupture: Secondary | ICD-10-CM | POA: Diagnosis not present

## 2016-11-28 DIAGNOSIS — I495 Sick sinus syndrome: Secondary | ICD-10-CM

## 2016-11-28 DIAGNOSIS — E782 Mixed hyperlipidemia: Secondary | ICD-10-CM | POA: Diagnosis not present

## 2016-11-28 DIAGNOSIS — I5032 Chronic diastolic (congestive) heart failure: Secondary | ICD-10-CM | POA: Diagnosis not present

## 2016-11-28 DIAGNOSIS — I480 Paroxysmal atrial fibrillation: Secondary | ICD-10-CM | POA: Diagnosis not present

## 2016-11-28 DIAGNOSIS — E559 Vitamin D deficiency, unspecified: Secondary | ICD-10-CM | POA: Diagnosis not present

## 2016-11-28 DIAGNOSIS — E871 Hypo-osmolality and hyponatremia: Secondary | ICD-10-CM | POA: Diagnosis not present

## 2016-11-28 MED ORDER — RIVAROXABAN 20 MG PO TABS: 20.0000 mg | ORAL_TABLET | Freq: Every day | ORAL | 0 refills | Status: DC

## 2016-11-28 MED ORDER — RIVAROXABAN 20 MG PO TABS: 20.0000 mg | ORAL_TABLET | Freq: Every day | ORAL | 6 refills | Status: DC

## 2016-11-28 NOTE — Telephone Encounter (Signed)
Received call & monitor report from [employer].  Patient currently wearing 14 day monitor (will finish 12/03/2016).  Monitor showed Atrial Fibrillation RVR sustained.  Report reviewed by Dr. Bronson Ing.    Per Dr. Bronson Ing:  Tachy-Brady Syndrome.  Stop Aspirin.  Begin Xarelto 20mg  daily.  Referral to EP.    Patient notified and verbalized understanding.  He agrees to seeing EP (Dr. Rayann Heman) at [facility] office.  Referral entered into [facility] & [facility] made aware.  Patient will pick up samples, free 30-day supply card & information sheet in regards to his diagnosis.  Two prescriptions sent to pharmacy - 1 for free 30-day card & 1 for regular supply for insurance to be ran through with.    He will continue with monitor till his end date of 12/03/2016.    New Xarelto management appointment scheduled with anticoagulation nurse for 1 month.  Follow up already scheduled with Dr. Bronson Ing for 01/16/2017.

## 2016-12-04 DIAGNOSIS — G4733 Obstructive sleep apnea (adult) (pediatric): Secondary | ICD-10-CM | POA: Diagnosis not present

## 2016-12-04 DIAGNOSIS — I480 Paroxysmal atrial fibrillation: Secondary | ICD-10-CM | POA: Diagnosis not present

## 2016-12-04 DIAGNOSIS — I10 Essential (primary) hypertension: Secondary | ICD-10-CM | POA: Diagnosis not present

## 2016-12-04 DIAGNOSIS — I495 Sick sinus syndrome: Secondary | ICD-10-CM | POA: Diagnosis not present

## 2016-12-04 DIAGNOSIS — I5032 Chronic diastolic (congestive) heart failure: Secondary | ICD-10-CM | POA: Diagnosis not present

## 2016-12-04 DIAGNOSIS — Z6828 Body mass index (BMI) 28.0-28.9, adult: Secondary | ICD-10-CM | POA: Diagnosis not present

## 2016-12-04 DIAGNOSIS — E782 Mixed hyperlipidemia: Secondary | ICD-10-CM | POA: Diagnosis not present

## 2016-12-04 DIAGNOSIS — E871 Hypo-osmolality and hyponatremia: Secondary | ICD-10-CM | POA: Diagnosis not present

## 2016-12-05 DIAGNOSIS — Z23 Encounter for immunization: Secondary | ICD-10-CM | POA: Diagnosis not present

## 2016-12-09 ENCOUNTER — Encounter: Payer: Self-pay | Admitting: Internal Medicine

## 2016-12-10 DIAGNOSIS — J209 Acute bronchitis, unspecified: Secondary | ICD-10-CM | POA: Diagnosis not present

## 2016-12-10 DIAGNOSIS — I10 Essential (primary) hypertension: Secondary | ICD-10-CM | POA: Diagnosis not present

## 2016-12-10 DIAGNOSIS — I5032 Chronic diastolic (congestive) heart failure: Secondary | ICD-10-CM | POA: Diagnosis not present

## 2016-12-10 DIAGNOSIS — I495 Sick sinus syndrome: Secondary | ICD-10-CM | POA: Diagnosis not present

## 2016-12-10 DIAGNOSIS — Z6827 Body mass index (BMI) 27.0-27.9, adult: Secondary | ICD-10-CM | POA: Diagnosis not present

## 2016-12-11 ENCOUNTER — Telehealth: Payer: Self-pay | Admitting: *Deleted

## 2016-12-11 DIAGNOSIS — M79671 Pain in right foot: Secondary | ICD-10-CM | POA: Diagnosis not present

## 2016-12-11 DIAGNOSIS — L110 Acquired keratosis follicularis: Secondary | ICD-10-CM | POA: Diagnosis not present

## 2016-12-11 DIAGNOSIS — M79672 Pain in left foot: Secondary | ICD-10-CM | POA: Diagnosis not present

## 2016-12-11 DIAGNOSIS — I739 Peripheral vascular disease, unspecified: Secondary | ICD-10-CM | POA: Diagnosis not present

## 2016-12-11 NOTE — Telephone Encounter (Signed)
Notes recorded by Laurine Blazer, LPN on 73/56/7014 at 3:54 PM EST Patient confirms that he is taking the Xarelto as previously prescribed. Copy to pmd. Follow up scheduled with Dr. Bronson Ing for 01/16/2017 & EP consult (Dr. Rayann Heman) for 12/18/2016. ------  Notes recorded by Herminio Commons, MD on 12/11/2016 at 12:28 PM EST Please make sure he is on anticoagulation.

## 2016-12-18 ENCOUNTER — Ambulatory Visit (INDEPENDENT_AMBULATORY_CARE_PROVIDER_SITE_OTHER): Payer: Medicare Other | Admitting: Internal Medicine

## 2016-12-18 ENCOUNTER — Encounter: Payer: Self-pay | Admitting: Internal Medicine

## 2016-12-18 VITALS — BP 128/80 | HR 61 | Ht 68.0 in | Wt 186.3 lb

## 2016-12-18 DIAGNOSIS — R0602 Shortness of breath: Secondary | ICD-10-CM

## 2016-12-18 DIAGNOSIS — I495 Sick sinus syndrome: Secondary | ICD-10-CM | POA: Diagnosis not present

## 2016-12-18 DIAGNOSIS — I480 Paroxysmal atrial fibrillation: Secondary | ICD-10-CM | POA: Diagnosis not present

## 2016-12-18 DIAGNOSIS — I10 Essential (primary) hypertension: Secondary | ICD-10-CM | POA: Diagnosis not present

## 2016-12-18 DIAGNOSIS — R609 Edema, unspecified: Secondary | ICD-10-CM | POA: Diagnosis not present

## 2016-12-18 DIAGNOSIS — R600 Localized edema: Secondary | ICD-10-CM

## 2016-12-18 MED ORDER — AMLODIPINE BESYLATE 2.5 MG PO TABS: 2.5000 mg | ORAL_TABLET | Freq: Every day | ORAL | 3 refills | Status: DC

## 2016-12-18 NOTE — Progress Notes (Signed)
Electrophysiology Office Note   Date:  12/18/2016   ID:  [name], DOB Jan 10, 1927, MRN [id number]  PCP:  Curlene Labrum, MD  Cardiologist:  Dr Bronson Ing Primary Electrophysiologist: [name], MD    Chief Complaint  Patient presents with  . Atrial Fibrillation     History of Present Illness: [name] is a 83 y.o. male who presents today for electrophysiology evaluation.   The patient is referred by Dr Bronson Ing for further consultation regarding sinus bradycardia and atrial fibrillation.    He reports a gradual decline in energy level over the last 3-4 years associated with some increase in shortness of breath and LE edema. A monitor was placed which showed paroxysmal atrial fibrillation with RVR as well as nocturnal bradycardia.  He reports being relatively asymptomatic while wearing monitor with no dizziness, syncope, or palpitations.  His symptoms of shortness of breath primarily correlated with sinus rhythm.  He does have occasional palpitations that mostly are nocturnal and self limiting. He has been placed on Xarelto for PAF and is tolerating without bleeding issues to date.   Today, he denies symptoms of chest pain, orthopnea, PND, claudication, dizziness, presyncope, syncope, bleeding, or neurologic sequela. The patient is tolerating medications without difficulties and is otherwise without complaint today.    Past Medical History:  Diagnosis Date  . AAA (abdominal aortic aneurysm) ([place])   . Atrial fibrillation (HCC)    bicarbonate perfusion study 10/11 EF 69%. small defects no ischemia. 7 metastases acheived. ER visit 11/10/09 ruled out MI normal sinus rhythm orthostasis after sublingual nitroglycerin   . BPH (benign prostatic hyperplasia)   . Chronic back pain   . GERD (gastroesophageal reflux disease)   . Hypertension   . Orthostatic hypotension   . PONV (postoperative nausea and vomiting)   . Sleep apnea    CPAP at night  . Stress fracture  of foot    left  . Swelling of both lower extremities    Past Surgical History:  Procedure Laterality Date  . CHOLECYSTECTOMY  2012   [facility] by Dr. Geroge Baseman  . COLONOSCOPY N/A 08/27/2012   Procedure: COLONOSCOPY;  Surgeon: Rogene Houston, MD;  Location[facility];  Service: Endoscopy;  Laterality: N/A;  325  . HERNIA REPAIR Bilateral    Inguinal and umbilical  . INCISIONAL HERNIA REPAIR N/A 01/31/2014   Procedure: Fatima Blank HERNIORRHAPHY WITH MESH;  Surgeon: Jamesetta So, MD;  Location: [facility] ORS;  Service: General;  Laterality: N/A;  . INSERTION OF MESH N/A 01/31/2014   Procedure: INSERTION OF MESH;  Surgeon: Jamesetta So, MD;  Location: [facility] ORS;  Service: General;  Laterality: N/A;  . SHOULDER SURGERY Right 2005?   for open rotator cuff repair. [place]  . WOUND DEBRIDEMENT Right 01/31/2014   Procedure: DEBRIDEMENT WOUND RIGHT LEG;  Surgeon: Jamesetta So, MD;  Location: [facility] ORS;  Service: General;  Laterality: Right;  . WRIST FRACTURE SURGERY Right      Current Outpatient Medications  Medication Sig Dispense Refill  . alfuzosin (UROXATRAL) 10 MG 24 hr tablet Take 10 mg by mouth daily with breakfast.    . amLODipine (NORVASC) 5 MG tablet TAKE 1 TABLET BY MOUTH DAILY 30 tablet 3  . Cholecalciferol (VITAMIN D) 400 UNITS capsule Take 800 Units by mouth daily.      . dorzolamide (TRUSOPT) 2 % ophthalmic solution Place 1 drop into the right eye 2 (two) times daily.     Marland Kitchen  esomeprazole (NEXIUM) 20 MG capsule Take 20 mg by mouth daily.    . finasteride (PROSCAR) 5 MG tablet Take 5 mg by mouth daily.      Marland Kitchen latanoprost (XALATAN) 0.005 % ophthalmic solution Place 1 drop into both eyes at bedtime.     . Omega-3 Fatty Acids (FISH OIL) 1000 MG CAPS Take 2 capsules by mouth daily.     . rivaroxaban (XARELTO) 20 MG TABS tablet Take 1 tablet (20 mg total) by mouth daily with supper. 14 tablet 0  . UNABLE TO FIND at bedtime. On CPAP     No current facility-administered  medications for this visit.     Allergies:   Flexeril [cyclobenzaprine hcl]; Hydrocodone-acetaminophen; Oxycodone-acetaminophen; Percocet [oxycodone-acetaminophen]; Sulfonamide derivatives; and Vicodin [hydrocodone-acetaminophen]   Social History:  The patient  reports that he quit smoking about 62 years ago. His smoking use included cigarettes. He started smoking about 73 years ago. He has a 10.00 pack-year smoking history. he has never used smokeless tobacco. He reports that he does not drink alcohol or use drugs.   Family History:  The patient's  family history includes Cancer in his sister; Heart failure in his father and mother.    ROS:  Please see the history of present illness.   All other systems are personally reviewed and negative.    PHYSICAL EXAM: VS:  BP 128/80   Pulse 61   Ht 5\' 8"  (1.727 m)   Wt 186 lb 4.8 oz (84.5 kg)   SpO2 96%   BMI 28.33 kg/m  , BMI Body mass index is 28.33 kg/m. GEN: elderly, in no acute distress  HEENT: normal, bandage to occipital area  Neck: no JVD, carotid bruits, or masses Cardiac: RRR; no murmurs, rubs, or gallops,no edema  Respiratory:  clear to auscultation bilaterally, normal work of breathing GI: soft, nontender, nondistended, + BS MS: no deformity or atrophy  Skin: warm and dry  Neuro:  Strength and sensation are intact Psych: euthymic mood, full affect  EKG:  EKG is ordered today. The ekg ordered today is personally reviewed and shows sinus rhythm, PVC, rate 61, nonspecific ST/T changes    Wt Readings from Last 3 Encounters:  12/18/16 186 lb 4.8 oz (84.5 kg)  11/13/16 190 lb (86.2 kg)  09/25/16 189 lb (85.7 kg)      Other studies personally reviewed: Additional studies/ records that were reviewed today include: Dr Court Joy office notes, event monitor    ASSESSMENT AND PLAN:  1.  Paroxysmal atrial fibrillation Asymptomatic On xarelto chads2vasc score is at least 3 I have offered cardizem 30mg  q6h prn  palpitations which he declines Given low burden, I do not feel that AAD therapy would be helpful presently  2. Sinus bradycardia Asymptomatic Episodes on recent event monitor are mostly nocturnal and without symptoms Periods of "Shortness of breath" correspond with normal heart rates.  He finds that his heart rates are rarely in the low 50s at home.  He is without symptoms then as well Presently, I do not feel that pacemaker implantation would offer benefit Conservative measures given advanced age are advised  3. Edema Due to venous insufficiency and norvasc Reduce norvasc to 2.5mg  daily Support hose encouraged  4. HTN Goal BP is <150/90 Reduce norvasc to 2.5mg  daily (as above) due to swelling  5. SOB/ edema Last echo (2013) reviewed Repeat echo Does not appear to be due to an arrhythmia  No further EP workup advised currently  Follow-up with Dr Bronson Ing as scheduled  Labs/ tests ordered today include:  Orders Placed This Encounter  Procedures  . EKG 12-Lead     Signed, [name], MD  12/18/2016 9:10 AM     [facility] [address] [place] [address] [phone] (office) [phone] (fax)

## 2016-12-18 NOTE — Patient Instructions (Addendum)
Medication Instructions:  Your physician has recommended you make the following change in your medication:   1.)  DECREASE Amlodipine to 2.5 mg DAILY  -- If you need a refill on your cardiac medications before your next appointment, please call your pharmacy. --  Labwork: None ordered  Testing/Procedures: Your physician has requested that you have an echocardiogram IN [place],  within 2 weeks. Echocardiography is a painless test that uses sound waves to create images of your heart. It provides your doctor with information about the size and shape of your heart and how well your heart's chambers and valves are working. This procedure takes approximately one hour. There are no restrictions for this procedure.    Follow-Up: Your physician wants you to follow-up as scheduled.    Thank you for choosing [facility]!!   Frederik Schmidt, RN [phone]  Any Other Special Instructions Will Be Listed Below (If Applicable).  Echocardiogram An echocardiogram, or echocardiography, uses sound waves (ultrasound) to produce an image of your heart. The echocardiogram is simple, painless, obtained within a short period of time, and offers valuable information to your health care provider. The images from an echocardiogram can provide information such as:  Evidence of coronary artery disease (CAD).  Heart size.  Heart muscle function.  Heart valve function.  Aneurysm detection.  Evidence of a past heart attack.  Fluid buildup around the heart.  Heart muscle thickening.  Assess heart valve function.  Tell a health care provider about:  Any allergies you have.  All medicines you are taking, including vitamins, herbs, eye drops, creams, and over-the-counter medicines.  Any problems you or family members have had with anesthetic medicines.  Any blood disorders you have.  Any surgeries you have had.  Any medical conditions you have.  Whether you are pregnant or may be  pregnant. What happens before the procedure? No special preparation is needed. Eat and drink normally. What happens during the procedure?  In order to produce an image of your heart, gel will be applied to your chest and a wand-like tool (transducer) will be moved over your chest. The gel will help transmit the sound waves from the transducer. The sound waves will harmlessly bounce off your heart to allow the heart images to be captured in real-time motion. These images will then be recorded.  You may need an IV to receive a medicine that improves the quality of the pictures. What happens after the procedure? You may return to your normal schedule including diet, activities, and medicines, unless your health care provider tells you otherwise. This information is not intended to replace advice given to you by your health care provider. Make sure you discuss any questions you have with your health care provider. Document Released: 01/12/2000 Document Revised: 09/02/2015 Document Reviewed: 09/21/2012 Elsevier Interactive Patient Education  2017 [employer].

## 2017-01-02 ENCOUNTER — Other Ambulatory Visit: Payer: Self-pay

## 2017-01-02 ENCOUNTER — Other Ambulatory Visit (HOSPITAL_COMMUNITY)
Admission: RE | Admit: 2017-01-02 | Discharge: 2017-01-02 | Disposition: A | Discharge status: Home / Self Care | Payer: Medicare Other | Source: Ambulatory Visit | Attending: Cardiovascular Disease | Admitting: Cardiovascular Disease

## 2017-01-02 ENCOUNTER — Ambulatory Visit (INDEPENDENT_AMBULATORY_CARE_PROVIDER_SITE_OTHER): Payer: Medicare Other | Admitting: *Deleted

## 2017-01-02 ENCOUNTER — Ambulatory Visit (INDEPENDENT_AMBULATORY_CARE_PROVIDER_SITE_OTHER): Payer: Medicare Other

## 2017-01-02 DIAGNOSIS — I4892 Unspecified atrial flutter: Secondary | ICD-10-CM | POA: Insufficient documentation

## 2017-01-02 DIAGNOSIS — Z5181 Encounter for therapeutic drug level monitoring: Secondary | ICD-10-CM

## 2017-01-02 DIAGNOSIS — I480 Paroxysmal atrial fibrillation: Secondary | ICD-10-CM | POA: Diagnosis not present

## 2017-01-02 DIAGNOSIS — R609 Edema, unspecified: Secondary | ICD-10-CM

## 2017-01-02 LAB — CBC
HCT: 43.3 % (ref 39.0–52.0)
Hemoglobin: 13.7 g/dL (ref 13.0–17.0)
MCH: 29.6 pg (ref 26.0–34.0)
MCHC: 31.6 g/dL (ref 30.0–36.0)
MCV: 93.5 fL (ref 78.0–100.0)
PLATELETS: 175 10*3/uL (ref 150–400)
RBC: 4.63 MIL/uL (ref 4.22–5.81)
RDW: 12.9 % (ref 11.5–15.5)
WBC: 8.2 10*3/uL (ref 4.0–10.5)

## 2017-01-02 LAB — BASIC METABOLIC PANEL
Anion gap: 6 (ref 5–15)
BUN: 16 mg/dL (ref 6–20)
CALCIUM: 9.4 mg/dL (ref 8.9–10.3)
CHLORIDE: 103 mmol/L (ref 101–111)
CO2: 29 mmol/L (ref 22–32)
CREATININE: 0.93 mg/dL (ref 0.61–1.24)
GFR calc non Af Amer: >60 mL/min (ref >60)
GLUCOSE: 111 mg/dL — AB (ref 65–99)
Potassium: 4.5 mmol/L (ref 3.5–5.1)
Sodium: 138 mmol/L (ref 135–145)

## 2017-01-02 NOTE — Progress Notes (Signed)
Patient ID: Michael LICHTY, male   DOB: 1926/02/18, 82 y.o.   MRN: [id number]   Pt was started on Xarelto 20mg  daily for atrial fibrillation on 11/28/16 by Dr. Bronson Ing.    Reviewed patients medication list.  Pt is not currently on any combined P-gp and strong CYP3A4 inhibitors/inducers (ketoconazole, traconazole, ritonavir, carbamazepine, phenytoin, rifampin, St. John's wort).  Reviewed labs from 01/02/17.  SCr 0.93, Weight 84.5kg, CrCl 73.53.  Dose is appropriate based on CrCl.   Hgb and HCT:  13.7/43.3  Pt denies any s/s of abnormal bruising, bleeding or GI upset since starting Xarelto.  A full discussion of the nature of anticoagulants has been carried out.  A benefit/risk analysis has been presented to the patient, so that they understand the justification for choosing anticoagulation with Xarelto at this time.  The need for compliance is stressed.  Pt is aware to take the medication once daily with the largest meal of the day.  Side effects of potential bleeding are discussed, including unusual colored urine or stools, coughing up blood or coffee ground emesis, nose bleeds or serious fall or head trauma.  Discussed signs and symptoms of stroke. The patient should avoid any OTC items containing aspirin or ibuprofen.  Avoid alcohol consumption.   Call if any signs of abnormal bleeding.  Discussed financial obligations and resolved any difficulty in obtaining medication.  Next lab test in 6 months.   Called with lab results from [facility] 12/6 and placed in recall for 6 months.

## 2017-01-08 ENCOUNTER — Telehealth: Payer: Self-pay | Admitting: *Deleted

## 2017-01-08 NOTE — Telephone Encounter (Signed)
Patient also questioned his Echo results - test ordered by Dr. Rayann Heman - notified of normal results as well.

## 2017-01-08 NOTE — Telephone Encounter (Signed)
Notes recorded by Laurine Blazer, LPN on 83/81/8403 at 1:30 PM EST Patient notified. Copy to pmd. Follow up scheduled for 01/16/2017 with Dr. Bronson Ing. ------  Notes recorded by Herminio Commons, MD on 01/03/2017 at 8:52 AM EST good

## 2017-01-08 NOTE — Telephone Encounter (Signed)
ECHO -   Notes recorded by [name], MD on 01/06/2017 at 10:23 AM EST Results reviewed. [name], please inform pt of result. I will route to primary care also.

## 2017-01-16 ENCOUNTER — Ambulatory Visit (INDEPENDENT_AMBULATORY_CARE_PROVIDER_SITE_OTHER): Payer: Medicare Other | Admitting: Cardiovascular Disease

## 2017-01-16 ENCOUNTER — Encounter: Payer: Self-pay | Admitting: Cardiovascular Disease

## 2017-01-16 ENCOUNTER — Other Ambulatory Visit: Payer: Self-pay

## 2017-01-16 VITALS — BP 130/82 | HR 47 | Ht 68.0 in | Wt 185.0 lb

## 2017-01-16 DIAGNOSIS — I714 Abdominal aortic aneurysm, without rupture, unspecified: Secondary | ICD-10-CM

## 2017-01-16 DIAGNOSIS — I480 Paroxysmal atrial fibrillation: Secondary | ICD-10-CM

## 2017-01-16 DIAGNOSIS — I495 Sick sinus syndrome: Secondary | ICD-10-CM | POA: Diagnosis not present

## 2017-01-16 DIAGNOSIS — I10 Essential (primary) hypertension: Secondary | ICD-10-CM | POA: Diagnosis not present

## 2017-01-16 DIAGNOSIS — R600 Localized edema: Secondary | ICD-10-CM | POA: Diagnosis not present

## 2017-01-16 NOTE — Patient Instructions (Signed)

## 2017-01-16 NOTE — Progress Notes (Signed)
SUBJECTIVE: The patient presents for routine follow-up.  He has paroxysmal atrial fibrillation and sinus bradycardia.  He was evaluated by Dr. Rayann Heman with electrophysiology.  He was offered diltiazem 30 mg every 6 hours to be used as needed for palpitations which he declined.  Given the low burden of atrial fibrillation, he did not feel anti-arrhythmic therapy would necessarily be helpful.  He also felt that the patient's sinus bradycardia was essentially asymptomatic and were mostly nocturnal.  He did have some lower extremity edema which was felt due to venous insufficiency and amlodipine use and reduced amlodipine to 2.5 mg daily.  He ordered an echocardiogram which demonstrated normal left ventricular systolic function and regional wall motion, LVEF 27-25%, diastolic dysfunction, moderate left atrial and mild right atrial dilatation.  I ordered an event monitor which demonstrated sinus rhythm and sinus bradycardia with PACs.  There was one episode of rapid atrial fibrillation, 180 bpm.  His symptoms of shortness of breath primarily correlated with sinus rhythm and sinus bradycardia with PACs.  He has been feeling well denies chest pain, palpitations, leg swelling, and shortness of breath.  He has not had any bleeding problems with Xarelto.  He enjoys playing corn hole and watching corn hole tournaments.    Review of Systems: As per "subjective", otherwise negative.  Allergies  Allergen Reactions  . Flexeril [Cyclobenzaprine Hcl] Nausea And Vomiting  . Hydrocodone-Acetaminophen Nausea And Vomiting  . Oxycodone-Acetaminophen Nausea And Vomiting  . Percocet [Oxycodone-Acetaminophen] Nausea And Vomiting  . Sulfonamide Derivatives     REACTION: RASH  . Vicodin [Hydrocodone-Acetaminophen] Nausea And Vomiting    Current Outpatient Medications  Medication Sig Dispense Refill  . alfuzosin (UROXATRAL) 10 MG 24 hr tablet Take 10 mg by mouth daily with breakfast.    . amLODipine  (NORVASC) 2.5 MG tablet Take 1 tablet (2.5 mg total) by mouth daily. 90 tablet 3  . Cholecalciferol (VITAMIN D) 400 UNITS capsule Take 800 Units by mouth daily.      . dorzolamide (TRUSOPT) 2 % ophthalmic solution Place 1 drop into the right eye 2 (two) times daily.     Marland Kitchen esomeprazole (NEXIUM) 20 MG capsule Take 20 mg by mouth daily.    . finasteride (PROSCAR) 5 MG tablet Take 5 mg by mouth daily.      . furosemide (LASIX) 20 MG tablet Take 20 mg by mouth.    . latanoprost (XALATAN) 0.005 % ophthalmic solution Place 1 drop into both eyes at bedtime.     . Omega-3 Fatty Acids (FISH OIL) 1000 MG CAPS Take 2 capsules by mouth daily.     . rivaroxaban (XARELTO) 20 MG TABS tablet Take 1 tablet (20 mg total) by mouth daily with supper. 14 tablet 0  . UNABLE TO FIND at bedtime. On CPAP     No current facility-administered medications for this visit.     Past Medical History:  Diagnosis Date  . AAA (abdominal aortic aneurysm) ([place])   . Atrial fibrillation (HCC)    bicarbonate perfusion study 10/11 EF 69%. small defects no ischemia. 7 metastases acheived. ER visit 11/10/09 ruled out MI normal sinus rhythm orthostasis after sublingual nitroglycerin   . BPH (benign prostatic hyperplasia)   . Chronic back pain   . GERD (gastroesophageal reflux disease)   . Hypertension   . Orthostatic hypotension   . PONV (postoperative nausea and vomiting)   . Sleep apnea    CPAP at night  . Stress fracture of foot  left  . Swelling of both lower extremities     Past Surgical History:  Procedure Laterality Date  . CHOLECYSTECTOMY  2012   [facility] by Dr. Geroge Baseman  . COLONOSCOPY N/A 08/27/2012   Procedure: COLONOSCOPY;  Surgeon: Rogene Houston, MD;  Location[facility];  Service: Endoscopy;  Laterality: N/A;  325  . HERNIA REPAIR Bilateral    Inguinal and umbilical  . INCISIONAL HERNIA REPAIR N/A 01/31/2014   Procedure: Fatima Blank HERNIORRHAPHY WITH MESH;  Surgeon: Jamesetta So, MD;  Location:  [facility] ORS;  Service: General;  Laterality: N/A;  . INSERTION OF MESH N/A 01/31/2014   Procedure: INSERTION OF MESH;  Surgeon: Jamesetta So, MD;  Location: [facility] ORS;  Service: General;  Laterality: N/A;  . SHOULDER SURGERY Right 2005?   for open rotator cuff repair. [place]  . WOUND DEBRIDEMENT Right 01/31/2014   Procedure: DEBRIDEMENT WOUND RIGHT LEG;  Surgeon: Jamesetta So, MD;  Location: [facility] ORS;  Service: General;  Laterality: Right;  . WRIST FRACTURE SURGERY Right     Social History   Socioeconomic History  . Marital status: Divorced    Spouse name: Not on file  . Number of children: Not on file  . Years of education: Not on file  . Highest education level: Not on file  Social Needs  . Financial resource strain: Not on file  . Food insecurity - worry: Not on file  . Food insecurity - inability: Not on file  . Transportation needs - medical: Not on file  . Transportation needs - non-medical: Not on file  Occupational History  . Not on file  Tobacco Use  . Smoking status: Former Smoker    Packs/day: 1.00    Years: 10.00    Pack years: 10.00    Types: Cigarettes    Start date: 10/15/1943    Last attempt to quit: 01/28/1954    Years since quitting: 63.0  . Smokeless tobacco: Never Used  . Tobacco comment: tobacco use - no  Substance and Sexual Activity  . Alcohol use: No    Alcohol/week: 0.0 oz  . Drug use: No  . Sexual activity: Not on file  Other Topics Concern  . Not on file  Social History Narrative  . Not on file     Vitals:   01/16/17 1053  BP: 130/82  Pulse: (!) 47  SpO2: 94%  Weight: 185 lb (83.9 kg)  Height: 5\' 8"  (1.727 m)    Wt Readings from Last 3 Encounters:  01/16/17 185 lb (83.9 kg)  12/18/16 186 lb 4.8 oz (84.5 kg)  11/13/16 190 lb (86.2 kg)     PHYSICAL EXAM General: NAD HEENT: Normal. Neck: No JVD, no thyromegaly. Lungs: Clear to auscultation bilaterally with normal respiratory effort. CV: Bradycardic, regular d rhythm,  normal S1/S2, no S3/S4, no murmur.  No lower extremity edema. Abdomen: Soft, nontender, no distention.  Neurologic: Alert and oriented.  Psych: Normal affect. Skin: Normal. Musculoskeletal: No gross deformities.    ECG: Most recent ECG reviewed.   Labs: Lab Results  Component Value Date/Time   K 4.5 01/02/2017 12:37 PM   BUN 16 01/02/2017 12:37 PM   CREATININE 0.93 01/02/2017 12:37 PM   CREATININE 0.91 08/12/2014 08:25 AM   ALT 14 (L) 11/18/2014 07:13 AM   HGB 13.7 01/02/2017 12:37 PM     Lipids: No results found for: LDLCALC, LDLDIRECT, CHOL, TRIG, HDL     ASSESSMENT AND PLAN: 1.  Paroxysmal atrial fibrillation: Symptomatically stable.  Event monitor reviewed above as well as echocardiogram.  There was moderate left atrial dilatation.  He is appropriately anticoagulated with Xarelto.  No changes to therapy.  2.  Asymptomatic sinus bradycardia: Episodes are primarily nocturnal.  No further testing is indicated.  3.  Chronic hypertension: Blood pressure is controlled.  No changes to therapy.  4.  Abdominal aortic aneurysm: Stable at 4.4 cm.  Followed by vascular surgery.  5.  Lower extremity edema: Likely secondary to venous insufficiency.  Compression stockings recommended.    Disposition: Follow up 6 months   Kate Sable, M.D., F.A.C.C.

## 2017-02-26 DIAGNOSIS — L110 Acquired keratosis follicularis: Secondary | ICD-10-CM | POA: Diagnosis not present

## 2017-02-26 DIAGNOSIS — I739 Peripheral vascular disease, unspecified: Secondary | ICD-10-CM | POA: Diagnosis not present

## 2017-02-26 DIAGNOSIS — M79671 Pain in right foot: Secondary | ICD-10-CM | POA: Diagnosis not present

## 2017-02-26 DIAGNOSIS — M79672 Pain in left foot: Secondary | ICD-10-CM | POA: Diagnosis not present

## 2017-03-06 DIAGNOSIS — I10 Essential (primary) hypertension: Secondary | ICD-10-CM | POA: Diagnosis not present

## 2017-03-06 DIAGNOSIS — E782 Mixed hyperlipidemia: Secondary | ICD-10-CM | POA: Diagnosis not present

## 2017-03-06 DIAGNOSIS — I5032 Chronic diastolic (congestive) heart failure: Secondary | ICD-10-CM | POA: Diagnosis not present

## 2017-03-06 DIAGNOSIS — I714 Abdominal aortic aneurysm, without rupture: Secondary | ICD-10-CM | POA: Diagnosis not present

## 2017-03-06 DIAGNOSIS — E871 Hypo-osmolality and hyponatremia: Secondary | ICD-10-CM | POA: Diagnosis not present

## 2017-03-06 DIAGNOSIS — I480 Paroxysmal atrial fibrillation: Secondary | ICD-10-CM | POA: Diagnosis not present

## 2017-03-06 DIAGNOSIS — I495 Sick sinus syndrome: Secondary | ICD-10-CM | POA: Diagnosis not present

## 2017-03-06 DIAGNOSIS — G4733 Obstructive sleep apnea (adult) (pediatric): Secondary | ICD-10-CM | POA: Diagnosis not present

## 2017-04-14 DIAGNOSIS — L570 Actinic keratosis: Secondary | ICD-10-CM | POA: Diagnosis not present

## 2017-04-14 DIAGNOSIS — Z85828 Personal history of other malignant neoplasm of skin: Secondary | ICD-10-CM | POA: Diagnosis not present

## 2017-04-25 DIAGNOSIS — H401113 Primary open-angle glaucoma, right eye, severe stage: Secondary | ICD-10-CM | POA: Diagnosis not present

## 2017-04-25 DIAGNOSIS — H401122 Primary open-angle glaucoma, left eye, moderate stage: Secondary | ICD-10-CM | POA: Diagnosis not present

## 2017-05-14 DIAGNOSIS — M79671 Pain in right foot: Secondary | ICD-10-CM | POA: Diagnosis not present

## 2017-05-14 DIAGNOSIS — I739 Peripheral vascular disease, unspecified: Secondary | ICD-10-CM | POA: Diagnosis not present

## 2017-05-14 DIAGNOSIS — M79672 Pain in left foot: Secondary | ICD-10-CM | POA: Diagnosis not present

## 2017-05-14 DIAGNOSIS — L110 Acquired keratosis follicularis: Secondary | ICD-10-CM | POA: Diagnosis not present

## 2017-07-10 DIAGNOSIS — Z85828 Personal history of other malignant neoplasm of skin: Secondary | ICD-10-CM | POA: Diagnosis not present

## 2017-07-10 DIAGNOSIS — L570 Actinic keratosis: Secondary | ICD-10-CM | POA: Diagnosis not present

## 2017-07-24 ENCOUNTER — Ambulatory Visit (INDEPENDENT_AMBULATORY_CARE_PROVIDER_SITE_OTHER): Payer: Medicare Other | Admitting: *Deleted

## 2017-07-24 DIAGNOSIS — Z5181 Encounter for therapeutic drug level monitoring: Secondary | ICD-10-CM | POA: Diagnosis not present

## 2017-07-24 DIAGNOSIS — I4891 Unspecified atrial fibrillation: Secondary | ICD-10-CM | POA: Diagnosis not present

## 2017-07-24 NOTE — Progress Notes (Signed)
Pt was started on Xarelto 20mg  daily for atrial fibrillation on 11/28/16 by Dr. Bronson Ing.    Reviewed patients medication list.  Pt is not currently on any combined P-gp and strong CYP3A4 inhibitors/inducers (ketoconazole, traconazole, ritonavir, carbamazepine, phenytoin, rifampin, St. John's wort).  Reviewed labs from  07/28/17.  SCr 0.95, Weight 84.6kg, CrCl 64.32 .  Dose is appropriate based on CrCl.   Hgb and HCT: 12.9/39.0  Plts 159  Pt denies any s/s of abnormal bruising, bleeding or GI upset since starting Xarelto.  A full discussion of the nature of anticoagulants has been carried out.  A benefit/risk analysis has been presented to the patient, so that they understand the justification for choosing anticoagulation with Xarelto at this time.  The need for compliance is stressed.  Pt is aware to take the medication once daily with the largest meal of the day.  Side effects of potential bleeding are discussed, including unusual colored urine or stools, coughing up blood or coffee ground emesis, nose bleeds or serious fall or head trauma.  Discussed signs and symptoms of stroke. The patient should avoid any OTC items containing aspirin or ibuprofen.  Avoid alcohol consumption.   Call if any signs of abnormal bleeding.  Discussed financial obligations and resolved any difficulty in obtaining medication.  Next lab test in 6 months.   Called pt with lab results from [facility] 6/27.  Placed in recall for 6 month follow up.

## 2017-07-28 ENCOUNTER — Other Ambulatory Visit: Payer: Self-pay | Admitting: Cardiovascular Disease

## 2017-07-28 DIAGNOSIS — Z5181 Encounter for therapeutic drug level monitoring: Secondary | ICD-10-CM | POA: Diagnosis not present

## 2017-07-28 DIAGNOSIS — I4891 Unspecified atrial fibrillation: Secondary | ICD-10-CM | POA: Diagnosis not present

## 2017-07-30 ENCOUNTER — Telehealth: Payer: Self-pay | Admitting: *Deleted

## 2017-07-30 NOTE — Telephone Encounter (Signed)
Notes recorded by Laurine Blazer, LPN on 08/29/7076 at 6:75 PM EDT Patient notified. Copy to pmd. Follow up scheduled for 09/01/2017 with Dr. Bronson Ing in [place].   ------  Notes recorded by Herminio Commons, MD on 07/30/2017 at 9:36 AM EDT Good.

## 2017-08-06 DIAGNOSIS — M79672 Pain in left foot: Secondary | ICD-10-CM | POA: Diagnosis not present

## 2017-08-06 DIAGNOSIS — M79671 Pain in right foot: Secondary | ICD-10-CM | POA: Diagnosis not present

## 2017-08-06 DIAGNOSIS — I739 Peripheral vascular disease, unspecified: Secondary | ICD-10-CM | POA: Diagnosis not present

## 2017-08-06 DIAGNOSIS — L110 Acquired keratosis follicularis: Secondary | ICD-10-CM | POA: Diagnosis not present

## 2017-08-13 ENCOUNTER — Ambulatory Visit (INDEPENDENT_AMBULATORY_CARE_PROVIDER_SITE_OTHER): Payer: Medicare Other | Admitting: Vascular Surgery

## 2017-08-13 ENCOUNTER — Ambulatory Visit (HOSPITAL_COMMUNITY)
Admission: RE | Admit: 2017-08-13 | Discharge: 2017-08-13 | Disposition: A | Discharge status: Home / Self Care | Payer: Medicare Other | Source: Ambulatory Visit | Attending: Vascular Surgery | Admitting: Vascular Surgery

## 2017-08-13 ENCOUNTER — Other Ambulatory Visit: Payer: Self-pay

## 2017-08-13 ENCOUNTER — Encounter: Payer: Self-pay | Admitting: Vascular Surgery

## 2017-08-13 VITALS — BP 132/71 | HR 42 | Temp 97.2°F | Resp 16 | Ht 68.0 in | Wt 180.0 lb

## 2017-08-13 DIAGNOSIS — I714 Abdominal aortic aneurysm, without rupture, unspecified: Secondary | ICD-10-CM

## 2017-08-13 NOTE — Progress Notes (Signed)
Patient name: [name] MRN: [id number] DOB: 01-11-1927 Sex: male  REASON FOR VISIT:   Follow-up of abdominal aortic aneurysm.  HPI:   [name] is a pleasant 84 y.o. male who I last saw on 08/08/2016.  At that time the maximum diameter of his aneurysm was 4.4 cm.  The right common iliac artery measured 1.3 cm in maximum diameter.  The left common iliac artery measured 1.4 cm in maximum diameter.  This had been stable over the last year.  He comes in for a one-year follow-up visit.  Since I saw him last, he denies any history of abdominal pain.  He does have some chronic low back pain.  He denies any claudication.  He has had some chronic leg swelling.  Is also had pain in his left ankle likely from arthritis.  He said acupuncture and multiple other therapies without much relief.  There have been no other significant changes to his medical history.  Past Medical History:  Diagnosis Date  . AAA (abdominal aortic aneurysm) ([place])   . Atrial fibrillation (HCC)    bicarbonate perfusion study 10/11 EF 69%. small defects no ischemia. 7 metastases acheived. ER visit 11/10/09 ruled out MI normal sinus rhythm orthostasis after sublingual nitroglycerin   . BPH (benign prostatic hyperplasia)   . Chronic back pain   . GERD (gastroesophageal reflux disease)   . Hypertension   . Orthostatic hypotension   . PONV (postoperative nausea and vomiting)   . Sleep apnea    CPAP at night  . Stress fracture of foot    left  . Swelling of both lower extremities     Family History  Problem Relation Age of Onset  . Heart failure Mother   . Heart failure Father   . Cancer Sister     SOCIAL HISTORY: Social History   Tobacco Use  . Smoking status: Former Smoker    Packs/day: 1.00    Years: 10.00    Pack years: 10.00    Types: Cigarettes    Start date: 10/15/1943    Last attempt to quit: 01/28/1954    Years since quitting: 63.5  . Smokeless tobacco: Never Used  . Tobacco comment:  tobacco use - no  Substance Use Topics  . Alcohol use: No    Alcohol/week: 0.0 oz    Allergies  Allergen Reactions  . Flexeril [Cyclobenzaprine Hcl] Nausea And Vomiting  . Hydrocodone-Acetaminophen Nausea And Vomiting  . Oxycodone-Acetaminophen Nausea And Vomiting  . Percocet [Oxycodone-Acetaminophen] Nausea And Vomiting  . Sulfonamide Derivatives     REACTION: RASH  . Vicodin [Hydrocodone-Acetaminophen] Nausea And Vomiting    Current Outpatient Medications  Medication Sig Dispense Refill  . alfuzosin (UROXATRAL) 10 MG 24 hr tablet Take 10 mg by mouth daily with breakfast.    . Cholecalciferol (VITAMIN D) 400 UNITS capsule Take 800 Units by mouth daily.      . dorzolamide (TRUSOPT) 2 % ophthalmic solution Place 1 drop into the right eye 2 (two) times daily.     Marland Kitchen esomeprazole (NEXIUM) 20 MG capsule Take 20 mg by mouth daily.    . finasteride (PROSCAR) 5 MG tablet Take 5 mg by mouth daily.      . furosemide (LASIX) 20 MG tablet Take 20 mg by mouth.    . latanoprost (XALATAN) 0.005 % ophthalmic solution Place 1 drop into both eyes at bedtime.     . Omega-3 Fatty Acids (FISH OIL) 1000 MG CAPS Take 2 capsules by mouth  daily.     . rivaroxaban (XARELTO) 20 MG TABS tablet Take 1 tablet (20 mg total) by mouth daily with supper. 14 tablet 0  . UNABLE TO FIND at bedtime. On CPAP    . XARELTO 20 MG TABS tablet TAKE ONE TABLET BY MOUTH EVERY DAY with SUPPER 30 tablet 1  . amLODipine (NORVASC) 2.5 MG tablet Take 1 tablet (2.5 mg total) by mouth daily. 90 tablet 3   No current facility-administered medications for this visit.     REVIEW OF SYSTEMS:  [X]  denotes positive finding, [ ]  denotes negative finding Cardiac  Comments:  Chest pain or chest pressure:    Shortness of breath upon exertion:    Short of breath when lying flat:    Irregular heart rhythm:        Vascular    Pain in calf, thigh, or hip brought on by ambulation:    Pain in feet at night that wakes you up from your  sleep:     Blood clot in your veins:    Leg swelling:         Pulmonary    Oxygen at home:    Productive cough:     Wheezing:         Neurologic    Sudden weakness in arms or legs:     Sudden numbness in arms or legs:     Sudden onset of difficulty speaking or slurred speech:    Temporary loss of vision in one eye:     Problems with dizziness:         Gastrointestinal    Blood in stool:     Vomited blood:         Genitourinary    Burning when urinating:     Blood in urine:        Psychiatric    Major depression:         Hematologic    Bleeding problems:    Problems with blood clotting too easily:        Skin    Rashes or ulcers:        Constitutional    Fever or chills:     PHYSICAL EXAM:   Vitals:   08/13/17 0912  BP: 132/71  Pulse: (!) 42  Resp: 16  Temp: (!) 97.2 F (36.2 C)  TempSrc: Oral  SpO2: 96%  Weight: 180 lb (81.6 kg)  Height: 5\' 8"  (1.727 m)    GENERAL: The patient is a well-nourished male, in no acute distress. The vital signs are documented above. CARDIAC: There is a regular rate and rhythm.  VASCULAR: I do not detect carotid bruits. He has palpable femoral pulses.  He has a palpable left dorsalis pedis pulse.  I cannot palpate a left posterior tibial pulse.  I cannot palpate pedal pulses on the right.  However, both feet are warm and well-perfused. He has mild bilateral lower extremity swelling and some mild hyperpigmentation consistent with chronic venous insufficiency. PULMONARY: There is good air exchange bilaterally without wheezing or rales. ABDOMEN: Soft and non-tender with normal pitched bowel sounds.  His aneurysm is palpable and nontender. MUSCULOSKELETAL: There are no major deformities or cyanosis. NEUROLOGIC: No focal weakness or paresthesias are detected. SKIN: There are no ulcers or rashes noted. PSYCHIATRIC: The patient has a normal affect.  DATA:    DUPLEX ABDOMINAL AORTA: I have independently interpreted his duplex of  the abdominal aorta.  The maximum diameter of the infrarenal aorta is 4.8  cm.  The maximum diameter of the right common iliac artery is 1.3 cm.  The maximum diameter of the left common iliac artery is 1.3 cm.  Thus the aneurysm is increased only slightly (4 mm) in the last year.  MEDICAL ISSUES:   4.8 CM INFRARENAL ABDOMINAL AORTIC ANEURYSM: His aneurysm has enlarged slightly from 4.4 to 4.8 cm in 1 year.  He is 84 years old.  I would not consider elective repair in a normal risk patient unless it reached 5.5 cm in maximum diameter or became symptomatic.  I think it is reasonable to continue his follow-up at 1 year and I will see him back in 1 year.  He is not a smoker.  His blood pressure has been under good control.  We have discussed a recent study which suggested that fluoroquinolones may be associated with increased risk of aneurysm enlargement.  He understands that if he did develop sudden abdominal pain he would want to get to the emergency department urgently and be sure that they were aware he has a history of an aneurysm so that a CT scan could be obtained to determine if this was the cause.  I will see him back in 1 year.  He knows to call sooner if he has problems.  Deitra Mayo [facility] [phone]

## 2017-08-28 ENCOUNTER — Other Ambulatory Visit: Payer: Self-pay

## 2017-09-01 ENCOUNTER — Telehealth: Payer: Self-pay | Admitting: Cardiovascular Disease

## 2017-09-01 ENCOUNTER — Ambulatory Visit (INDEPENDENT_AMBULATORY_CARE_PROVIDER_SITE_OTHER): Payer: Medicare Other | Admitting: Cardiovascular Disease

## 2017-09-01 ENCOUNTER — Encounter: Payer: Self-pay | Admitting: *Deleted

## 2017-09-01 ENCOUNTER — Encounter: Payer: Self-pay | Admitting: Cardiovascular Disease

## 2017-09-01 VITALS — BP 118/64 | HR 53 | Ht 68.0 in | Wt 186.0 lb

## 2017-09-01 DIAGNOSIS — I714 Abdominal aortic aneurysm, without rupture, unspecified: Secondary | ICD-10-CM

## 2017-09-01 DIAGNOSIS — I480 Paroxysmal atrial fibrillation: Secondary | ICD-10-CM | POA: Diagnosis not present

## 2017-09-01 DIAGNOSIS — R0609 Other forms of dyspnea: Secondary | ICD-10-CM

## 2017-09-01 DIAGNOSIS — R001 Bradycardia, unspecified: Secondary | ICD-10-CM

## 2017-09-01 DIAGNOSIS — R5383 Other fatigue: Secondary | ICD-10-CM

## 2017-09-01 DIAGNOSIS — I10 Essential (primary) hypertension: Secondary | ICD-10-CM

## 2017-09-01 DIAGNOSIS — R600 Localized edema: Secondary | ICD-10-CM | POA: Diagnosis not present

## 2017-09-01 NOTE — Patient Instructions (Signed)
Medication Instructions:  Continue all current medications.  Labwork: none  Testing/Procedures:  Your physician has requested that you have an exercise stress myoview. For further information please visit [email or web address]. Please follow instruction sheet, as given.  Office will contact with results via phone or letter.    Follow-Up: 6-8 weeks   Any Other Special Instructions Will Be Listed Below (If Applicable).  If you need a refill on your cardiac medications before your next appointment, please call your pharmacy.

## 2017-09-01 NOTE — Telephone Encounter (Signed)
Pre-cert Verification for the following procedure   EXERCISE MYOVIEW SCHEDULED FOR 09-04-17

## 2017-09-01 NOTE — Progress Notes (Signed)
SUBJECTIVE: The patient presents for follow-up of paroxysmal atrial fibrillation and bradycardia. He has noticed progressive exertional dyspnea and fatigue over the past 6 months.  He is unable to do activities which he was readily able to do 1 year ago.  He denies exertional chest pain.  He has measured his heart rate at home in the 40 bpm range in the early afternoon.  Event monitoring in October 2018 showed an average heart rate of 57 bpm with sinus rhythm and sinus bradycardia with PACs.  There was one episode of rapid atrial fibrillation at 180 bpm.  Symptoms of shortness of breath primarily correlated with sinus rhythm and sinus bradycardia with PACs.     Review of Systems: As per "subjective", otherwise negative.  Allergies  Allergen Reactions  . Flexeril [Cyclobenzaprine Hcl] Nausea And Vomiting  . Hydrocodone-Acetaminophen Nausea And Vomiting  . Oxycodone-Acetaminophen Nausea And Vomiting  . Percocet [Oxycodone-Acetaminophen] Nausea And Vomiting  . Sulfonamide Derivatives     REACTION: RASH  . Vicodin [Hydrocodone-Acetaminophen] Nausea And Vomiting    Current Outpatient Medications  Medication Sig Dispense Refill  . alfuzosin (UROXATRAL) 10 MG 24 hr tablet Take 10 mg by mouth daily with breakfast.    . amLODipine (NORVASC) 2.5 MG tablet Take 1 tablet (2.5 mg total) by mouth daily. 90 tablet 3  . Cholecalciferol (VITAMIN D) 400 UNITS capsule Take 800 Units by mouth daily.      . dorzolamide (TRUSOPT) 2 % ophthalmic solution Place 1 drop into the right eye 2 (two) times daily.     Marland Kitchen esomeprazole (NEXIUM) 20 MG capsule Take 20 mg by mouth daily.    . finasteride (PROSCAR) 5 MG tablet Take 5 mg by mouth daily.      . furosemide (LASIX) 20 MG tablet Take 20 mg by mouth.    . latanoprost (XALATAN) 0.005 % ophthalmic solution Place 1 drop into both eyes at bedtime.     . Omega-3 Fatty Acids (FISH OIL) 1000 MG CAPS Take 2 capsules by mouth daily.     . rivaroxaban (XARELTO)  20 MG TABS tablet Take 1 tablet (20 mg total) by mouth daily with supper. 14 tablet 0  . UNABLE TO FIND at bedtime. On CPAP     No current facility-administered medications for this visit.     Past Medical History:  Diagnosis Date  . AAA (abdominal aortic aneurysm) ([place])   . Atrial fibrillation (HCC)    bicarbonate perfusion study 10/11 EF 69%. small defects no ischemia. 7 metastases acheived. ER visit 11/10/09 ruled out MI normal sinus rhythm orthostasis after sublingual nitroglycerin   . BPH (benign prostatic hyperplasia)   . Chronic back pain   . GERD (gastroesophageal reflux disease)   . Hypertension   . Orthostatic hypotension   . PONV (postoperative nausea and vomiting)   . Sleep apnea    CPAP at night  . Stress fracture of foot    left  . Swelling of both lower extremities     Past Surgical History:  Procedure Laterality Date  . CHOLECYSTECTOMY  2012   [facility] by Dr. Geroge Baseman  . COLONOSCOPY N/A 08/27/2012   Procedure: COLONOSCOPY;  Surgeon: Rogene Houston, MD;  Location: [facility] ENDO SUITE;  Service: Endoscopy;  Laterality: N/A;  325  . HERNIA REPAIR Bilateral    Inguinal and umbilical  . INCISIONAL HERNIA REPAIR N/A 01/31/2014   Procedure: Fatima Blank HERNIORRHAPHY WITH MESH;  Surgeon: Jamesetta So, MD;  Location: [facility]  ORS;  Service: General;  Laterality: N/A;  . INSERTION OF MESH N/A 01/31/2014   Procedure: INSERTION OF MESH;  Surgeon: Jamesetta So, MD;  Location: [facility] ORS;  Service: General;  Laterality: N/A;  . SHOULDER SURGERY Right 2005?   for open rotator cuff repair. [place]  . WOUND DEBRIDEMENT Right 01/31/2014   Procedure: DEBRIDEMENT WOUND RIGHT LEG;  Surgeon: Jamesetta So, MD;  Location: [facility] ORS;  Service: General;  Laterality: Right;  . WRIST FRACTURE SURGERY Right     Social History   Socioeconomic History  . Marital status: Divorced    Spouse name: Not on file  . Number of children: Not on file  . Years of education: Not on file  .  Highest education level: Not on file  Occupational History  . Not on file  Social Needs  . Financial resource strain: Not on file  . Food insecurity:    Worry: Not on file    Inability: Not on file  . Transportation needs:    Medical: Not on file    Non-medical: Not on file  Tobacco Use  . Smoking status: Former Smoker    Packs/day: 1.00    Years: 10.00    Pack years: 10.00    Types: Cigarettes    Start date: 10/15/1943    Last attempt to quit: 01/28/1954    Years since quitting: 63.6  . Smokeless tobacco: Never Used  . Tobacco comment: tobacco use - no  Substance and Sexual Activity  . Alcohol use: No    Alcohol/week: 0.0 oz  . Drug use: No  . Sexual activity: Not on file  Lifestyle  . Physical activity:    Days per week: Not on file    Minutes per session: Not on file  . Stress: Not on file  Relationships  . Social connections:    Talks on phone: Not on file    Gets together: Not on file    Attends religious service: Not on file    Active member of club or organization: Not on file    Attends meetings of clubs or organizations: Not on file    Relationship status: Not on file  . Intimate partner violence:    Fear of current or ex partner: Not on file    Emotionally abused: Not on file    Physically abused: Not on file    Forced sexual activity: Not on file  Other Topics Concern  . Not on file  Social History Narrative  . Not on file     Vitals:   09/01/17 0813  BP: 118/64  Pulse: (!) 53  SpO2: 93%  Weight: 186 lb (84.4 kg)  Height: 5\' 8"  (1.727 m)    Wt Readings from Last 3 Encounters:  09/01/17 186 lb (84.4 kg)  08/13/17 180 lb (81.6 kg)  01/16/17 185 lb (83.9 kg)     PHYSICAL EXAM General: NAD HEENT: Normal. Neck: No JVD, no thyromegaly. Lungs: Clear to auscultation bilaterally with normal respiratory effort. CV: Bradycardic, mostly regular rhythm with premature contractions, normal S1/S2, no S3/S4, no murmur. No pretibial or periankle edema.     Abdomen: Soft, nontender, no distention.  Neurologic: Alert and oriented.  Psych: Normal affect. Skin: Normal. Musculoskeletal: No gross deformities.    ECG: Reviewed above under Subjective   Labs: Lab Results  Component Value Date/Time   K 4.5 01/02/2017 12:37 PM   BUN 16 01/02/2017 12:37 PM   CREATININE 0.93 01/02/2017 12:37  PM   CREATININE 0.91 08/12/2014 08:25 AM   ALT 14 (L) 11/18/2014 07:13 AM   HGB 13.7 01/02/2017 12:37 PM     Lipids: No results found for: LDLCALC, LDLDIRECT, CHOL, TRIG, HDL     ASSESSMENT AND PLAN: 1.  Paroxysmal atrial fibrillation: Symptomatically stable.  Anticoagulated with Xarelto.  No changes to therapy.  2.  Progressive exertional dyspnea and fatigue with bradycardia: I am concerned that his symptoms represent symptomatic bradycardia with chronotropic incompetence.  I will obtain an exercise Myoview both to assess for an appropriate increase in heart rate with exercise as well as ischemic heart disease so as to make certain the latter is not overlooked.  3.  Hypertension: Controlled.  No change to therapy.  4.  Abdominal aortic aneurysm: 4.8 cm on 08/13/2017.  Followed by vascular surgery.  5.  Lower extremity edema: Likely secondary to venous insufficiency.  Compression stockings are recommended.    Disposition: Follow up 6-8 weeks   Kate Sable, M.D., F.A.C.C.

## 2017-09-03 DIAGNOSIS — I10 Essential (primary) hypertension: Secondary | ICD-10-CM | POA: Diagnosis not present

## 2017-09-03 DIAGNOSIS — E782 Mixed hyperlipidemia: Secondary | ICD-10-CM | POA: Diagnosis not present

## 2017-09-03 DIAGNOSIS — G4733 Obstructive sleep apnea (adult) (pediatric): Secondary | ICD-10-CM | POA: Diagnosis not present

## 2017-09-03 DIAGNOSIS — M810 Age-related osteoporosis without current pathological fracture: Secondary | ICD-10-CM | POA: Diagnosis not present

## 2017-09-03 DIAGNOSIS — I480 Paroxysmal atrial fibrillation: Secondary | ICD-10-CM | POA: Diagnosis not present

## 2017-09-03 DIAGNOSIS — K219 Gastro-esophageal reflux disease without esophagitis: Secondary | ICD-10-CM | POA: Diagnosis not present

## 2017-09-03 DIAGNOSIS — E871 Hypo-osmolality and hyponatremia: Secondary | ICD-10-CM | POA: Diagnosis not present

## 2017-09-04 ENCOUNTER — Encounter (HOSPITAL_BASED_OUTPATIENT_CLINIC_OR_DEPARTMENT_OTHER)
Admission: RE | Admit: 2017-09-04 | Discharge: 2017-09-04 | Disposition: A | Discharge status: Home / Self Care | Payer: Medicare Other | Source: Ambulatory Visit | Attending: Cardiovascular Disease | Admitting: Cardiovascular Disease

## 2017-09-04 ENCOUNTER — Encounter (HOSPITAL_COMMUNITY): Payer: Self-pay

## 2017-09-04 ENCOUNTER — Encounter (HOSPITAL_COMMUNITY)
Admission: RE | Admit: 2017-09-04 | Discharge: 2017-09-04 | Disposition: A | Discharge status: Home / Self Care | Payer: Medicare Other | Source: Ambulatory Visit | Attending: Cardiovascular Disease | Admitting: Cardiovascular Disease

## 2017-09-04 DIAGNOSIS — R0609 Other forms of dyspnea: Secondary | ICD-10-CM | POA: Insufficient documentation

## 2017-09-04 LAB — NM MYOCAR MULTI W/SPECT W/WALL MOTION / EF
CHL CUP NUCLEAR SDS: 0
CHL CUP NUCLEAR SSS: 1
CHL RATE OF PERCEIVED EXERTION: 17
CSEPED: 2 min
CSEPEDS: 53 s
CSEPPHR: 117 {beats}/min
Estimated workload: 4.6 METS
LV dias vol: 87 mL (ref 62–150)
LV sys vol: 31 mL
MPHR: 132 {beats}/min
NUC STRESS TID: 1.05
Percent HR: 88 %
RATE: 0.31
Rest HR: 46 {beats}/min
SRS: 1

## 2017-09-04 MED ORDER — TECHNETIUM TC 99M TETROFOSMIN IV KIT
10.0000 | PACK | Freq: Once | INTRAVENOUS | Status: AC | PRN
  Administered 2017-09-04: 10.22 via INTRAVENOUS

## 2017-09-04 MED ORDER — TECHNETIUM TC 99M TETROFOSMIN IV KIT
30.0000 | PACK | Freq: Once | INTRAVENOUS | Status: AC | PRN
  Administered 2017-09-04: 30.1 via INTRAVENOUS

## 2017-09-04 MED ORDER — REGADENOSON 0.4 MG/5ML IV SOLN
INTRAVENOUS | Status: AC
  Filled 2017-09-04: qty 5

## 2017-09-04 MED ORDER — SODIUM CHLORIDE 0.9% FLUSH
INTRAVENOUS | Status: AC
Start: 2017-09-04 — End: 2017-09-04
  Administered 2017-09-04: 10 mL via INTRAVENOUS
  Filled 2017-09-04: qty 10

## 2017-09-07 ENCOUNTER — Other Ambulatory Visit: Payer: Self-pay | Admitting: Cardiovascular Disease

## 2017-09-08 DIAGNOSIS — Z1339 Encounter for screening examination for other mental health and behavioral disorders: Secondary | ICD-10-CM | POA: Diagnosis not present

## 2017-09-08 DIAGNOSIS — K219 Gastro-esophageal reflux disease without esophagitis: Secondary | ICD-10-CM | POA: Diagnosis not present

## 2017-09-08 DIAGNOSIS — I5032 Chronic diastolic (congestive) heart failure: Secondary | ICD-10-CM | POA: Diagnosis not present

## 2017-09-08 DIAGNOSIS — E559 Vitamin D deficiency, unspecified: Secondary | ICD-10-CM | POA: Diagnosis not present

## 2017-09-08 DIAGNOSIS — I480 Paroxysmal atrial fibrillation: Secondary | ICD-10-CM | POA: Diagnosis not present

## 2017-09-08 DIAGNOSIS — Z0001 Encounter for general adult medical examination with abnormal findings: Secondary | ICD-10-CM | POA: Diagnosis not present

## 2017-09-08 DIAGNOSIS — G4733 Obstructive sleep apnea (adult) (pediatric): Secondary | ICD-10-CM | POA: Diagnosis not present

## 2017-09-08 DIAGNOSIS — Z6827 Body mass index (BMI) 27.0-27.9, adult: Secondary | ICD-10-CM | POA: Diagnosis not present

## 2017-09-08 DIAGNOSIS — M810 Age-related osteoporosis without current pathological fracture: Secondary | ICD-10-CM | POA: Diagnosis not present

## 2017-09-08 DIAGNOSIS — I10 Essential (primary) hypertension: Secondary | ICD-10-CM | POA: Diagnosis not present

## 2017-09-08 DIAGNOSIS — N400 Enlarged prostate without lower urinary tract symptoms: Secondary | ICD-10-CM | POA: Diagnosis not present

## 2017-09-08 DIAGNOSIS — E782 Mixed hyperlipidemia: Secondary | ICD-10-CM | POA: Diagnosis not present

## 2017-10-17 ENCOUNTER — Ambulatory Visit (INDEPENDENT_AMBULATORY_CARE_PROVIDER_SITE_OTHER): Payer: Medicare Other | Admitting: Urology

## 2017-10-17 DIAGNOSIS — N401 Enlarged prostate with lower urinary tract symptoms: Secondary | ICD-10-CM | POA: Diagnosis not present

## 2017-10-17 DIAGNOSIS — R351 Nocturia: Secondary | ICD-10-CM | POA: Diagnosis not present

## 2017-10-22 DIAGNOSIS — M79672 Pain in left foot: Secondary | ICD-10-CM | POA: Diagnosis not present

## 2017-10-22 DIAGNOSIS — M79671 Pain in right foot: Secondary | ICD-10-CM | POA: Diagnosis not present

## 2017-10-22 DIAGNOSIS — L110 Acquired keratosis follicularis: Secondary | ICD-10-CM | POA: Diagnosis not present

## 2017-10-22 DIAGNOSIS — I739 Peripheral vascular disease, unspecified: Secondary | ICD-10-CM | POA: Diagnosis not present

## 2017-10-24 DIAGNOSIS — H401113 Primary open-angle glaucoma, right eye, severe stage: Secondary | ICD-10-CM | POA: Diagnosis not present

## 2017-10-24 DIAGNOSIS — H0100B Unspecified blepharitis left eye, upper and lower eyelids: Secondary | ICD-10-CM | POA: Diagnosis not present

## 2017-10-24 DIAGNOSIS — H0100A Unspecified blepharitis right eye, upper and lower eyelids: Secondary | ICD-10-CM | POA: Diagnosis not present

## 2017-10-24 DIAGNOSIS — H401122 Primary open-angle glaucoma, left eye, moderate stage: Secondary | ICD-10-CM | POA: Diagnosis not present

## 2017-11-05 ENCOUNTER — Ambulatory Visit (INDEPENDENT_AMBULATORY_CARE_PROVIDER_SITE_OTHER): Payer: Medicare Other | Admitting: Cardiovascular Disease

## 2017-11-05 ENCOUNTER — Encounter: Payer: Self-pay | Admitting: Cardiovascular Disease

## 2017-11-05 VITALS — BP 120/60 | HR 42 | Ht 68.0 in | Wt 184.0 lb

## 2017-11-05 DIAGNOSIS — I714 Abdominal aortic aneurysm, without rupture, unspecified: Secondary | ICD-10-CM

## 2017-11-05 DIAGNOSIS — R5383 Other fatigue: Secondary | ICD-10-CM | POA: Diagnosis not present

## 2017-11-05 DIAGNOSIS — I10 Essential (primary) hypertension: Secondary | ICD-10-CM | POA: Diagnosis not present

## 2017-11-05 DIAGNOSIS — R0609 Other forms of dyspnea: Secondary | ICD-10-CM

## 2017-11-05 DIAGNOSIS — I480 Paroxysmal atrial fibrillation: Secondary | ICD-10-CM

## 2017-11-05 DIAGNOSIS — R001 Bradycardia, unspecified: Secondary | ICD-10-CM | POA: Diagnosis not present

## 2017-11-05 NOTE — Patient Instructions (Signed)
Medication Instructions:  Continue all current medications.  Labwork: none  Testing/Procedures: none  Follow-Up: 3 months   Any Other Special Instructions Will Be Listed Below (If Applicable). See Dr. Rayann Heman for symptomatic bradycardia.  If you need a refill on your cardiac medications before your next appointment, please call your pharmacy.

## 2017-11-05 NOTE — Progress Notes (Signed)
SUBJECTIVE: The patient presents for follow-up of paroxysmal atrial fibrillation and bradycardia.  Nuclear stress test on 09/04/2017 showed no evidence of ischemia, heart rate increased to 117 bpm with exercise.  PACs and PVCs were noted with exercise and during recovery.  He was evaluated by Dr. Rayann Heman in November 2018 and at that time he did not feel a pacemaker would be beneficial.  Since that time the patient has been having progressive exertional dyspnea, fatigue, and near syncopal episodes.  I reviewed his blood pressure and heart rate log which shows heart rates ranging from 40 and sometimes up to 60 bpm.  When his heart rate is in the 40s his wife notices that he looks pale and the patient feels significantly fatigued.  ECG performed in the office today which I ordered and personally interpreted demonstrates marked sinus bradycardia with nonspecific T wave ab normalities, heart rate 40 bpm.  He said "this is not me.  I like to be very active normally ".  He was supposed to go to [place] next week but canceled the trip because he has not been feeling up to going.   Review of Systems: As per "subjective", otherwise negative.  Allergies  Allergen Reactions  . Flexeril [Cyclobenzaprine Hcl] Nausea And Vomiting  . Hydrocodone-Acetaminophen Nausea And Vomiting  . Oxycodone-Acetaminophen Nausea And Vomiting  . Percocet [Oxycodone-Acetaminophen] Nausea And Vomiting  . Sulfonamide Derivatives     REACTION: RASH  . Vicodin [Hydrocodone-Acetaminophen] Nausea And Vomiting    Current Outpatient Medications  Medication Sig Dispense Refill  . alfuzosin (UROXATRAL) 10 MG 24 hr tablet Take 10 mg by mouth daily with breakfast.    . Cholecalciferol (VITAMIN D) 400 UNITS capsule Take 800 Units by mouth daily.      . dorzolamide (TRUSOPT) 2 % ophthalmic solution Place 1 drop into the right eye 2 (two) times daily.     Marland Kitchen esomeprazole (NEXIUM) 20 MG capsule Take 20 mg by mouth daily.    .  finasteride (PROSCAR) 5 MG tablet Take 5 mg by mouth daily.      . furosemide (LASIX) 20 MG tablet Take 20 mg by mouth.    . latanoprost (XALATAN) 0.005 % ophthalmic solution Place 1 drop into both eyes at bedtime.     . Omega-3 Fatty Acids (FISH OIL) 1000 MG CAPS Take 2 capsules by mouth daily.     . rivaroxaban (XARELTO) 20 MG TABS tablet Take 1 tablet (20 mg total) by mouth daily with supper. 14 tablet 0  . UNABLE TO FIND at bedtime. On CPAP    . amLODipine (NORVASC) 2.5 MG tablet Take 1 tablet (2.5 mg total) by mouth daily. 90 tablet 3   No current facility-administered medications for this visit.     Past Medical History:  Diagnosis Date  . AAA (abdominal aortic aneurysm) ([place])   . Atrial fibrillation (HCC)    bicarbonate perfusion study 10/11 EF 69%. small defects no ischemia. 7 metastases acheived. ER visit 11/10/09 ruled out MI normal sinus rhythm orthostasis after sublingual nitroglycerin   . BPH (benign prostatic hyperplasia)   . Chronic back pain   . GERD (gastroesophageal reflux disease)   . Hypertension   . Orthostatic hypotension   . PONV (postoperative nausea and vomiting)   . Sleep apnea    CPAP at night  . Stress fracture of foot    left  . Swelling of both lower extremities     Past Surgical History:  Procedure Laterality Date  .  CHOLECYSTECTOMY  2012   [facility] by Dr. Geroge Baseman  . COLONOSCOPY N/A 08/27/2012   Procedure: COLONOSCOPY;  Surgeon: Rogene Houston, MD;  Location[facility] END[facility];  Service: Endoscopy;  Laterality: N/A;  325  . HERNIA REPAIR Bilateral    Inguinal and umbilical  . INCISIONAL HERNIA REPAIR N/A 01/31/2014   Procedure: Fatima Blank HERNIORRHAPHY WITH MESH;  Surgeon: Jamesetta So, MD;  Location: [facility] ORS;  Service: General;  Laterality: N/A;  . INSERTION OF MESH N/A 01/31/2014   Procedure: INSERTION OF MESH;  Surgeon: Jamesetta So, MD;  Location: [facility] ORS;  Service: General;  Laterality: N/A;  . SHOULDER SURGERY Right 2005?   for open  rotator cuff repair. [place]  . WOUND DEBRIDEMENT Right 01/31/2014   Procedure: DEBRIDEMENT WOUND RIGHT LEG;  Surgeon: Jamesetta So, MD;  Location: [facility] ORS;  Service: General;  Laterality: Right;  . WRIST FRACTURE SURGERY Right     Social History   Socioeconomic History  . Marital status: Divorced    Spouse name: Not on file  . Number of children: Not on file  . Years of education: Not on file  . Highest education level: Not on file  Occupational History  . Not on file  Social Needs  . Financial resource strain: Not on file  . Food insecurity:    Worry: Not on file    Inability: Not on file  . Transportation needs:    Medical: Not on file    Non-medical: Not on file  Tobacco Use  . Smoking status: Former Smoker    Packs/day: 1.00    Years: 10.00    Pack years: 10.00    Types: Cigarettes    Start date: 10/15/1943    Last attempt to quit: 01/28/1954    Years since quitting: 63.8  . Smokeless tobacco: Never Used  . Tobacco comment: tobacco use - no  Substance and Sexual Activity  . Alcohol use: No    Alcohol/week: 0.0 standard drinks  . Drug use: No  . Sexual activity: Not on file  Lifestyle  . Physical activity:    Days per week: Not on file    Minutes per session: Not on file  . Stress: Not on file  Relationships  . Social connections:    Talks on phone: Not on file    Gets together: Not on file    Attends religious service: Not on file    Active member of club or organization: Not on file    Attends meetings of clubs or organizations: Not on file    Relationship status: Not on file  . Intimate partner violence:    Fear of current or ex partner: Not on file    Emotionally abused: Not on file    Physically abused: Not on file    Forced sexual activity: Not on file  Other Topics Concern  . Not on file  Social History Narrative  . Not on file     Vitals:   11/05/17 1003  BP: 120/60  Pulse: (!) 42  SpO2: 96%  Weight: 184 lb (83.5 kg)    Height: 5\' 8"  (1.727 m)    Wt Readings from Last 3 Encounters:  11/05/17 184 lb (83.5 kg)  09/01/17 186 lb (84.4 kg)  08/13/17 180 lb (81.6 kg)     PHYSICAL EXAM General: NAD HEENT: Normal. Neck: No JVD, no thyromegaly. Lungs: Clear to auscultation bilaterally with normal respiratory effort. CV: Bradycardic, regular rhythm,  normal S1/S2, no S3/S4, no murmur. No pretibial or periankle edema.  Abdomen: Soft, nontender, no distention.  Neurologic: Alert and oriented.  Psych: Normal affect. Skin: Normal. Musculoskeletal: No gross deformities.    ECG: Reviewed above under Subjective   Labs: Lab Results  Component Value Date/Time   K 4.5 01/02/2017 12:37 PM   BUN 16 01/02/2017 12:37 PM   CREATININE 0.93 01/02/2017 12:37 PM   CREATININE 0.91 08/12/2014 08:25 AM   ALT 14 (L) 11/18/2014 07:13 AM   HGB 13.7 01/02/2017 12:37 PM     Lipids: No results found for: LDLCALC, LDLDIRECT, CHOL, TRIG, HDL     ASSESSMENT AND PLAN:  1.  Paroxysmal atrial fibrillation: Symptomatically stable with respect to palpitations.  Anticoagulated with Xarelto.  No changes to therapy.  2.  Progressive exertional dyspnea and fatigue with bradycardia: I am concerned that his symptoms represent symptomatic bradycardia as detailed above.  Nuclear stress test was normal.  I will have him follow-up with Dr. Rayann Heman for pacemaker consideration.  3.  Hypertension: Controlled.  No change to therapy.  4.  Abdominal aortic aneurysm: 4.8 cm on 08/13/2017.  Followed by vascular surgery.  5.  Lower extremity edema: Likely secondary to venous insufficiency.  Compression stockings are recommended.   Disposition: Follow up with EP in the near future.  Follow-up with me in 3 months.   Kate Sable, M.D., F.A.C.C.

## 2017-11-10 ENCOUNTER — Other Ambulatory Visit: Payer: Self-pay | Admitting: Cardiovascular Disease

## 2017-11-13 DIAGNOSIS — Z23 Encounter for immunization: Secondary | ICD-10-CM | POA: Diagnosis not present

## 2017-11-17 ENCOUNTER — Encounter: Payer: Self-pay | Admitting: Internal Medicine

## 2017-11-17 ENCOUNTER — Ambulatory Visit (INDEPENDENT_AMBULATORY_CARE_PROVIDER_SITE_OTHER): Payer: Medicare Other | Admitting: Internal Medicine

## 2017-11-17 VITALS — BP 104/68 | HR 64 | Ht 68.0 in | Wt 180.2 lb

## 2017-11-17 DIAGNOSIS — R001 Bradycardia, unspecified: Secondary | ICD-10-CM | POA: Diagnosis not present

## 2017-11-17 DIAGNOSIS — I10 Essential (primary) hypertension: Secondary | ICD-10-CM | POA: Diagnosis not present

## 2017-11-17 DIAGNOSIS — I495 Sick sinus syndrome: Secondary | ICD-10-CM

## 2017-11-17 DIAGNOSIS — I480 Paroxysmal atrial fibrillation: Secondary | ICD-10-CM | POA: Diagnosis not present

## 2017-11-17 NOTE — Progress Notes (Signed)
PCP: Curlene Labrum, MD Primary Cardiologist: Dr Bronson Ing Primary EP: Dr Rayann Heman  [name] is a 84 y.o. male who presents today for routine electrophysiology followup.  Since last being seen in our clinic, the patient reports doing reasonably well.  I saw him 11/2016 (my note reviewed today).  He has had progressive symptoms of fatigue and exertional dyspnea since that time.  He also reports presyncope.  He has frequent heart rates 40s at home.  His wife feels that his symptoms correlate to bradycardia.  Today, he denies symptoms of palpitations, chest pain,  lower extremity edema, dizziness, presyncope, or syncope.  The patient is otherwise without complaint today.   Past Medical History:  Diagnosis Date  . AAA (abdominal aortic aneurysm) ([place])   . Atrial fibrillation (HCC)    bicarbonate perfusion study 10/11 EF 69%. small defects no ischemia. 7 metastases acheived. ER visit 11/10/09 ruled out MI normal sinus rhythm orthostasis after sublingual nitroglycerin   . BPH (benign prostatic hyperplasia)   . Chronic back pain   . GERD (gastroesophageal reflux disease)   . Hypertension   . Orthostatic hypotension   . PONV (postoperative nausea and vomiting)   . Sleep apnea    CPAP at night  . Stress fracture of foot    left  . Swelling of both lower extremities    Past Surgical History:  Procedure Laterality Date  . CHOLECYSTECTOMY  2012   [facility] [place] by Dr. Geroge Baseman  . COLONOSCOPY N/A 08/27/2012   Procedure: COLONOSCOPY;  Surgeon: Rogene Houston, MD;  [facility];  Service: Endoscopy;  Laterality: N/A;  325  . HERNIA REPAIR Bilateral    Inguinal and umbilical  . INCISIONAL HERNIA REPAIR N/A 01/31/2014   Procedure: Fatima Blank HERNIORRHAPHY WITH MESH;  Surgeon: Jamesetta So, MD;  Location: [facility] ORS;  Service: General;  Laterality: N/A;  . INSERTION OF MESH N/A 01/31/2014   Procedure: INSERTION OF MESH;  Surgeon: Jamesetta So, MD;  Location: [facility] ORS;  Service:  General;  Laterality: N/A;  . SHOULDER SURGERY Right 2005?   for open rotator cuff repair. [place]  . WOUND DEBRIDEMENT Right 01/31/2014   Procedure: DEBRIDEMENT WOUND RIGHT LEG;  Surgeon: Jamesetta So, MD;  Location: [facility] ORS;  Service: General;  Laterality: Right;  . WRIST FRACTURE SURGERY Right     ROS- all systems are reviewed and negatives except as per HPI above  Current Outpatient Medications  Medication Sig Dispense Refill  . alfuzosin (UROXATRAL) 10 MG 24 hr tablet Take 10 mg by mouth daily with breakfast.    . Cholecalciferol (VITAMIN D) 400 UNITS capsule Take 800 Units by mouth daily.      . dorzolamide (TRUSOPT) 2 % ophthalmic solution Place 1 drop into the right eye 2 (two) times daily.     Marland Kitchen esomeprazole (NEXIUM) 20 MG capsule Take 20 mg by mouth daily.    . finasteride (PROSCAR) 5 MG tablet Take 5 mg by mouth daily.      . furosemide (LASIX) 20 MG tablet Take 20 mg by mouth.    . latanoprost (XALATAN) 0.005 % ophthalmic solution Place 1 drop into both eyes at bedtime.     . Omega-3 Fatty Acids (FISH OIL) 1000 MG CAPS Take 2 capsules by mouth daily.     Marland Kitchen UNABLE TO FIND at bedtime. On CPAP    . XARELTO 20 MG TABS tablet TAKE 1 TABLET BY MOUTH EVERY DAY with SUPPER  30 tablet 6  . amLODipine (NORVASC) 2.5 MG tablet Take 1 tablet (2.5 mg total) by mouth daily. 90 tablet 3   No current facility-administered medications for this visit.     Physical Exam: Vitals:   11/17/17 1051  BP: 104/68  Pulse: 64  SpO2: 97%  Weight: 180 lb 3.2 oz (81.7 kg)  Height: 5\' 8"  (1.727 m)    GEN- The patient is well appearing, alert and oriented x 3 today.   Head- normocephalic, atraumatic Eyes-  Sclera clear, conjunctiva pink Ears- hearing intact Oropharynx- clear Lungs- Clear to ausculation bilaterally, normal work of breathing Heart- Regular rate and rhythm, no murmurs, rubs or gallops, PMI not laterally displaced GI- soft, NT, ND, + BS Extremities- no clubbing,  cyanosis, or edema  Wt Readings from Last 3 Encounters:  11/17/17 180 lb 3.2 oz (81.7 kg)  11/05/17 184 lb (83.5 kg)  09/01/17 186 lb (84.4 kg)    EKG tracing ordered today is personally reviewed and shows sinus bradycardia 50 bpm with frequent PACs (bigeminal), PR 208 msec), Qtc 418 msec  Assessment and Plan:  1. Sinus bradycardia/ sick sinus syndrome I agree with pt, wife and Dr Bronson Ing that he is likely more symptomatic with bradycardia.  No reversible causes have been found.   I would therefore recommend pacemaker implantation at this time.  Risks, benefits, alternatives to pacemaker implantation were discussed in detail with the patient today. The patient understands that the risks include but are not limited to bleeding, infection, pneumothorax, perforation, tamponade, vascular damage, renal failure, MI, stroke, death,  and lead dislodgement and wishes to proceed. We will therefore schedule the procedure at the next available time.    Hold xarelto 24 hours prior to the procedure.  2. Paroxysmal atrial fibrillation Asymptomatic On xarelto for chads2vasc score of a least 3.   3. HTN Stable No change required today  4. Edema/ SOB Stable Likely due to diastolic dysfunction and venous insufficiency EF normal by echo 01/02/17. No change required today   [name], [facility] 11/17/2017

## 2017-11-17 NOTE — Patient Instructions (Addendum)
Medication Instructions:  Your physician recommends that you continue on your current medications as directed. Please refer to the Current Medication list given to you today.  Labwork: You will get lab work today:  BMP and CBC.  Testing/Procedures: Your physician has recommended that you have a pacemaker inserted. A pacemaker is a small device that is placed under the skin of your chest or abdomen to help control abnormal heart rhythms. This device uses electrical pulses to prompt the heart to beat at a normal rate. Pacemakers are used to treat heart rhythms that are too slow. Wire (leads) are attached to the pacemaker that goes into the chambers of you heart. This is done in the hospital and usually requires and overnight stay. Please see the instruction sheet given to you today for more information.  Follow-Up: You will follow up with device clinic 10-14 days after your procedure for a wound check.  You will follow up with Dr. Rayann Heman 91 days after your procedure.   PACEMAKER INSTRUCTIONS:  Please arrive at the [facility] main entrance of [facility] hospital at:  11:00 am on December 02, 2017 Use the CHG surgical scrub as directed Do not eat or drink after midnight prior to procedure Do not take any medications the morning of the procedure Hold your XARELTO for 24 hours prior to your procedure.  Your last dose will be November 30, 2017 your evening dose Plan for one night stay You will need someone to drive you home at discharge  If you need a refill on your cardiac medications before your next appointment, please call your pharmacy.   Pacemaker Implantation, Adult Pacemaker implantation is a procedure to place a pacemaker inside your chest. A pacemaker is a small computer that sends electrical signals to the heart and helps your heart beat normally. A pacemaker also stores information about your heart rhythms. You may need pacemaker implantation if you:  Have a slow heartbeat  (bradycardia).  Faint (syncope).  Have shortness of breath (dyspnea) due to heart problems.  The pacemaker attaches to your heart through a wire, called a lead. Sometimes just one lead is needed. Other times, there will be two leads. There are two types of pacemakers:  Transvenous pacemaker. This type is placed under the skin or muscle of your chest. The lead goes through a vein in the chest area to reach the inside of the heart.  Epicardial pacemaker. This type is placed under the skin or muscle of your chest or belly. The lead goes through your chest to the outside of the heart.  Tell a health care provider about:  Any allergies you have.  All medicines you are taking, including vitamins, herbs, eye drops, creams, and over-the-counter medicines.  Any problems you or family members have had with anesthetic medicines.  Any blood or bone disorders you have.  Any surgeries you have had.  Any medical conditions you have.  Whether you are pregnant or may be pregnant. What are the risks? Generally, this is a safe procedure. However, problems may occur, including:  Infection.  Bleeding.  Failure of the pacemaker or the lead.  Collapse of a lung or bleeding into a lung.  Blood clot inside a blood vessel with a lead.  Damage to the heart.  Infection inside the heart (endocarditis).  Allergic reactions to medicines.  What happens before the procedure? Staying hydrated Follow instructions from your health care provider about hydration, which may include:  Up to 2 hours before the procedure -  you may continue to drink clear liquids, such as water, clear fruit juice, black coffee, and plain tea.  Eating and drinking restrictions Follow instructions from your health care provider about eating and drinking, which may include:  8 hours before the procedure - stop eating heavy meals or foods such as meat, fried foods, or fatty foods.  6 hours before the procedure - stop  eating light meals or foods, such as toast or cereal.  6 hours before the procedure - stop drinking milk or drinks that contain milk.  2 hours before the procedure - stop drinking clear liquids.  Medicines  Ask your health care provider about: ? Changing or stopping your regular medicines. This is especially important if you are taking diabetes medicines or blood thinners. ? Taking medicines such as aspirin and ibuprofen. These medicines can thin your blood. Do not take these medicines before your procedure if your health care provider instructs you not to.  You may be given antibiotic medicine to help prevent infection. General instructions  You will have a heart evaluation. This may include an electrocardiogram (ECG), chest X-ray, and heart imaging (echocardiogram,  or echo) tests.  You will have blood tests.  Do not use any products that contain nicotine or tobacco, such as cigarettes and e-cigarettes. If you need help quitting, ask your health care provider.  Plan to have someone take you home from the hospital or clinic.  If you will be going home right after the procedure, plan to have someone with you for 24 hours.  Ask your health care provider how your surgical site will be marked or identified. What happens during the procedure?  To reduce your risk of infection: ? Your health care team will wash or sanitize their hands. ? Your skin will be washed with soap. ? Hair may be removed from the surgical area.  An IV tube will be inserted into one of your veins.  You will be given one or more of the following: ? A medicine to help you relax (sedative). ? A medicine to numb the area (local anesthetic). ? A medicine to make you fall asleep (general anesthetic).  If you are getting a transvenous pacemaker: ? An incision will be made in your upper chest. ? A pocket will be made for the pacemaker. It may be placed under the skin or between layers of muscle. ? The lead will be  inserted into a blood vessel that returns to the heart. ? While X-rays are taken by an imaging machine (fluoroscopy), the lead will be advanced through the vein to the inside of your heart. ? The other end of the lead will be tunneled under the skin and attached to the pacemaker.  If you are getting an epicardial pacemaker: ? An incision will be made near your ribs or breastbone (sternum) for the lead. ? The lead will be attached to the outside of your heart. ? Another incision will be made in your chest or upper belly to create a pocket for the pacemaker. ? The free end of the lead will be tunneled under the skin and attached to the pacemaker.  The transvenous or epicardial pacemaker will be tested. Imaging studies may be done to check the lead position.  The incisions will be closed with stitches (sutures), adhesive strips, or skin glue.  Bandages (dressing) will be placed over the incisions. The procedure may vary among health care providers and hospitals. What happens after the procedure?  Your blood pressure, heart  rate, breathing rate, and blood oxygen level will be monitored until the medicines you were given have worn off.  You will be given antibiotics and pain medicine.  ECG and chest x-rays will be done.  You will wear a continuous type of ECG (Holter monitor) to check your heart rhythm.  Your health care provider willprogram the pacemaker.  Do not drive for 24 hours if you received a sedative. This information is not intended to replace advice given to you by your health care provider. Make sure you discuss any questions you have with your health care provider. Document Released: 01/04/2002 Document Revised: 08/04/2015 Document Reviewed: 06/28/2015 Elsevier Interactive Patient Education  [employer].

## 2017-11-17 NOTE — H&P (View-Only) (Signed)
PCP: Curlene Labrum, MD Primary Cardiologist: Dr Bronson Ing Primary EP: Dr Rayann Heman  [name] is a 84 y.o. male who presents today for routine electrophysiology followup.  Since last being seen in our clinic, the patient reports doing reasonably well.  I saw him 11/2016 (my note reviewed today).  He has had progressive symptoms of fatigue and exertional dyspnea since that time.  He also reports presyncope.  He has frequent heart rates 40s at home.  His wife feels that his symptoms correlate to bradycardia.  Today, he denies symptoms of palpitations, chest pain,  lower extremity edema, dizziness, presyncope, or syncope.  The patient is otherwise without complaint today.   Past Medical History:  Diagnosis Date  . AAA (abdominal aortic aneurysm) ([place])   . Atrial fibrillation (HCC)    bicarbonate perfusion study 10/11 EF 69%. small defects no ischemia. 7 metastases acheived. ER visit 11/10/09 ruled out MI normal sinus rhythm orthostasis after sublingual nitroglycerin   . BPH (benign prostatic hyperplasia)   . Chronic back pain   . GERD (gastroesophageal reflux disease)   . Hypertension   . Orthostatic hypotension   . PONV (postoperative nausea and vomiting)   . Sleep apnea    CPAP at night  . Stress fracture of foot    left  . Swelling of both lower extremities    Past Surgical History:  Procedure Laterality Date  . CHOLECYSTECTOMY  2012   [facility] [place] by Dr. Geroge Baseman  . COLONOSCOPY N/A 08/27/2012   Procedure: COLONOSCOPY;  Surgeon: Rogene Houston, MD;  [facility];  Service: Endoscopy;  Laterality: N/A;  325  . HERNIA REPAIR Bilateral    Inguinal and umbilical  . INCISIONAL HERNIA REPAIR N/A 01/31/2014   Procedure: Fatima Blank HERNIORRHAPHY WITH MESH;  Surgeon: Jamesetta So, MD;  Location: [facility] ORS;  Service: General;  Laterality: N/A;  . INSERTION OF MESH N/A 01/31/2014   Procedure: INSERTION OF MESH;  Surgeon: Jamesetta So, MD;  Location: [facility] ORS;  Service:  General;  Laterality: N/A;  . SHOULDER SURGERY Right 2005?   for open rotator cuff repair. [place]  . WOUND DEBRIDEMENT Right 01/31/2014   Procedure: DEBRIDEMENT WOUND RIGHT LEG;  Surgeon: Jamesetta So, MD;  Location: [facility] ORS;  Service: General;  Laterality: Right;  . WRIST FRACTURE SURGERY Right     ROS- all systems are reviewed and negatives except as per HPI above  Current Outpatient Medications  Medication Sig Dispense Refill  . alfuzosin (UROXATRAL) 10 MG 24 hr tablet Take 10 mg by mouth daily with breakfast.    . Cholecalciferol (VITAMIN D) 400 UNITS capsule Take 800 Units by mouth daily.      . dorzolamide (TRUSOPT) 2 % ophthalmic solution Place 1 drop into the right eye 2 (two) times daily.     Marland Kitchen esomeprazole (NEXIUM) 20 MG capsule Take 20 mg by mouth daily.    . finasteride (PROSCAR) 5 MG tablet Take 5 mg by mouth daily.      . furosemide (LASIX) 20 MG tablet Take 20 mg by mouth.    . latanoprost (XALATAN) 0.005 % ophthalmic solution Place 1 drop into both eyes at bedtime.     . Omega-3 Fatty Acids (FISH OIL) 1000 MG CAPS Take 2 capsules by mouth daily.     Marland Kitchen UNABLE TO FIND at bedtime. On CPAP    . XARELTO 20 MG TABS tablet TAKE 1 TABLET BY MOUTH EVERY DAY with SUPPER  30 tablet 6  . amLODipine (NORVASC) 2.5 MG tablet Take 1 tablet (2.5 mg total) by mouth daily. 90 tablet 3   No current facility-administered medications for this visit.     Physical Exam: Vitals:   11/17/17 1051  BP: 104/68  Pulse: 64  SpO2: 97%  Weight: 180 lb 3.2 oz (81.7 kg)  Height: 5\' 8"  (1.727 m)    GEN- The patient is well appearing, alert and oriented x 3 today.   Head- normocephalic, atraumatic Eyes-  Sclera clear, conjunctiva pink Ears- hearing intact Oropharynx- clear Lungs- Clear to ausculation bilaterally, normal work of breathing Heart- Regular rate and rhythm, no murmurs, rubs or gallops, PMI not laterally displaced GI- soft, NT, ND, + BS Extremities- no clubbing,  cyanosis, or edema  Wt Readings from Last 3 Encounters:  11/17/17 180 lb 3.2 oz (81.7 kg)  11/05/17 184 lb (83.5 kg)  09/01/17 186 lb (84.4 kg)    EKG tracing ordered today is personally reviewed and shows sinus bradycardia 50 bpm with frequent PACs (bigeminal), PR 208 msec), Qtc 418 msec  Assessment and Plan:  1. Sinus bradycardia/ sick sinus syndrome I agree with pt, wife and Dr Bronson Ing that he is likely more symptomatic with bradycardia.  No reversible causes have been found.   I would therefore recommend pacemaker implantation at this time.  Risks, benefits, alternatives to pacemaker implantation were discussed in detail with the patient today. The patient understands that the risks include but are not limited to bleeding, infection, pneumothorax, perforation, tamponade, vascular damage, renal failure, MI, stroke, death,  and lead dislodgement and wishes to proceed. We will therefore schedule the procedure at the next available time.    Hold xarelto 24 hours prior to the procedure.  2. Paroxysmal atrial fibrillation Asymptomatic On xarelto for chads2vasc score of a least 3.   3. HTN Stable No change required today  4. Edema/ SOB Stable Likely due to diastolic dysfunction and venous insufficiency EF normal by echo 01/02/17. No change required today   [name], [facility] 11/17/2017

## 2017-11-18 LAB — BASIC METABOLIC PANEL
BUN / CREAT RATIO: 9 — AB (ref 10–24)
BUN: 10 mg/dL (ref 8–27)
CALCIUM: 10 mg/dL (ref 8.6–10.2)
CO2: 24 mmol/L (ref 20–29)
Chloride: 104 mmol/L (ref 96–106)
Creatinine, Ser: 1.11 mg/dL (ref 0.76–1.27)
GFR calc Af Amer: 68 mL/min/{1.73_m2} (ref >59)
GFR, EST NON AFRICAN AMERICAN: 59 mL/min/{1.73_m2} — AB (ref >59)
Glucose: 102 mg/dL — ABNORMAL HIGH (ref 65–99)
Potassium: 4.9 mmol/L (ref 3.5–5.2)
Sodium: 142 mmol/L (ref 134–144)

## 2017-11-18 LAB — CBC WITH DIFFERENTIAL/PLATELET
BASOS ABS: 0.1 10*3/uL (ref 0.0–0.2)
Basos: 1 %
EOS (ABSOLUTE): 0.2 10*3/uL (ref 0.0–0.4)
Eos: 3 %
Hematocrit: 40.7 % (ref 37.5–51.0)
Hemoglobin: 13.7 g/dL (ref 13.0–17.7)
IMMATURE GRANS (ABS): 0 10*3/uL (ref 0.0–0.1)
IMMATURE GRANULOCYTES: 0 %
LYMPHS: 35 %
Lymphocytes Absolute: 2.4 10*3/uL (ref 0.7–3.1)
MCH: 31.2 pg (ref 26.6–33.0)
MCHC: 33.7 g/dL (ref 31.5–35.7)
MCV: 93 fL (ref 79–97)
MONOS ABS: 0.6 10*3/uL (ref 0.1–0.9)
Monocytes: 9 %
NEUTROS ABS: 3.5 10*3/uL (ref 1.4–7.0)
NEUTROS PCT: 52 %
Platelets: 173 10*3/uL (ref 150–450)
RBC: 4.39 x10E6/uL (ref 4.14–5.80)
RDW: 12.9 % (ref 12.3–15.4)
WBC: 6.8 10*3/uL (ref 3.4–10.8)

## 2017-12-02 ENCOUNTER — Encounter (HOSPITAL_COMMUNITY)
Admission: RE | Disposition: A | Discharge status: Home / Self Care | Payer: Self-pay | Source: Ambulatory Visit | Attending: Internal Medicine

## 2017-12-02 ENCOUNTER — Other Ambulatory Visit: Payer: Self-pay

## 2017-12-02 ENCOUNTER — Encounter (HOSPITAL_COMMUNITY): Payer: Self-pay

## 2017-12-02 ENCOUNTER — Ambulatory Visit (HOSPITAL_COMMUNITY)
Admission: RE | Admit: 2017-12-02 | Discharge: 2017-12-03 | Disposition: A | Discharge status: Home / Self Care | Payer: Medicare Other | Source: Ambulatory Visit | Attending: Internal Medicine | Admitting: Internal Medicine

## 2017-12-02 DIAGNOSIS — R0602 Shortness of breath: Secondary | ICD-10-CM | POA: Diagnosis not present

## 2017-12-02 DIAGNOSIS — N400 Enlarged prostate without lower urinary tract symptoms: Secondary | ICD-10-CM | POA: Diagnosis not present

## 2017-12-02 DIAGNOSIS — Z885 Allergy status to narcotic agent status: Secondary | ICD-10-CM | POA: Diagnosis not present

## 2017-12-02 DIAGNOSIS — Z9049 Acquired absence of other specified parts of digestive tract: Secondary | ICD-10-CM | POA: Insufficient documentation

## 2017-12-02 DIAGNOSIS — Z882 Allergy status to sulfonamides status: Secondary | ICD-10-CM | POA: Insufficient documentation

## 2017-12-02 DIAGNOSIS — Z79899 Other long term (current) drug therapy: Secondary | ICD-10-CM | POA: Insufficient documentation

## 2017-12-02 DIAGNOSIS — I10 Essential (primary) hypertension: Secondary | ICD-10-CM | POA: Diagnosis not present

## 2017-12-02 DIAGNOSIS — Z7901 Long term (current) use of anticoagulants: Secondary | ICD-10-CM | POA: Diagnosis not present

## 2017-12-02 DIAGNOSIS — Z9889 Other specified postprocedural states: Secondary | ICD-10-CM | POA: Insufficient documentation

## 2017-12-02 DIAGNOSIS — R609 Edema, unspecified: Secondary | ICD-10-CM | POA: Diagnosis not present

## 2017-12-02 DIAGNOSIS — G4733 Obstructive sleep apnea (adult) (pediatric): Secondary | ICD-10-CM | POA: Diagnosis not present

## 2017-12-02 DIAGNOSIS — K219 Gastro-esophageal reflux disease without esophagitis: Secondary | ICD-10-CM | POA: Diagnosis not present

## 2017-12-02 DIAGNOSIS — I480 Paroxysmal atrial fibrillation: Secondary | ICD-10-CM | POA: Diagnosis not present

## 2017-12-02 DIAGNOSIS — I495 Sick sinus syndrome: Secondary | ICD-10-CM | POA: Insufficient documentation

## 2017-12-02 DIAGNOSIS — I714 Abdominal aortic aneurysm, without rupture: Secondary | ICD-10-CM | POA: Insufficient documentation

## 2017-12-02 DIAGNOSIS — Z959 Presence of cardiac and vascular implant and graft, unspecified: Secondary | ICD-10-CM

## 2017-12-02 HISTORY — PX: PACEMAKER IMPLANT: EP1218

## 2017-12-02 LAB — SURGICAL PCR SCREEN
MRSA, PCR: POSITIVE — AB
Staphylococcus aureus: POSITIVE — AB

## 2017-12-02 SURGERY — PACEMAKER IMPLANT

## 2017-12-02 MED ORDER — LIDOCAINE HCL (PF) 1 % IJ SOLN
INTRAMUSCULAR | Status: AC
  Filled 2017-12-02: qty 30

## 2017-12-02 MED ORDER — CHLORHEXIDINE GLUCONATE 4 % EX LIQD: 60.0000 mL | Freq: Once | CUTANEOUS | Status: DC

## 2017-12-02 MED ORDER — MIDAZOLAM HCL 5 MG/5ML IJ SOLN
INTRAMUSCULAR | Status: DC | PRN
  Administered 2017-12-02 (×2): 1 mg via INTRAVENOUS

## 2017-12-02 MED ORDER — FINASTERIDE 5 MG PO TABS
5.0000 mg | ORAL_TABLET | Freq: Every day | ORAL | Status: DC
  Administered 2017-12-03: 5 mg via ORAL
  Filled 2017-12-02: qty 1

## 2017-12-02 MED ORDER — MIDAZOLAM HCL 5 MG/5ML IJ SOLN
INTRAMUSCULAR | Status: AC
  Filled 2017-12-02: qty 5

## 2017-12-02 MED ORDER — ACETAMINOPHEN 325 MG PO TABS
325.0000 mg | ORAL_TABLET | ORAL | Status: DC | PRN
  Administered 2017-12-02 (×2): 650 mg via ORAL
  Filled 2017-12-02 (×2): qty 2

## 2017-12-02 MED ORDER — ALFUZOSIN HCL ER 10 MG PO TB24
10.0000 mg | ORAL_TABLET | Freq: Every day | ORAL | Status: DC
  Administered 2017-12-03: 10 mg via ORAL
  Filled 2017-12-02: qty 1

## 2017-12-02 MED ORDER — IOPAMIDOL (ISOVUE-370) INJECTION 76%
INTRAVENOUS | Status: DC | PRN
  Administered 2017-12-02: 15 mL via INTRAVENOUS

## 2017-12-02 MED ORDER — MUPIROCIN 2 % EX OINT
1.0000 "application " | TOPICAL_OINTMENT | Freq: Two times a day (BID) | CUTANEOUS | Status: DC
  Administered 2017-12-02: 1 via NASAL
  Filled 2017-12-02: qty 22

## 2017-12-02 MED ORDER — SODIUM CHLORIDE 0.9 % IV SOLN
80.0000 mg | INTRAVENOUS | Status: AC
  Administered 2017-12-02: 80 mg

## 2017-12-02 MED ORDER — FENTANYL CITRATE (PF) 100 MCG/2ML IJ SOLN
INTRAMUSCULAR | Status: AC
  Filled 2017-12-02: qty 2

## 2017-12-02 MED ORDER — MUPIROCIN 2 % EX OINT
TOPICAL_OINTMENT | CUTANEOUS | Status: AC
  Administered 2017-12-02: 1
  Filled 2017-12-02: qty 22

## 2017-12-02 MED ORDER — CEFAZOLIN SODIUM-DEXTROSE 1-4 GM/50ML-% IV SOLN
1.0000 g | Freq: Four times a day (QID) | INTRAVENOUS | Status: AC
  Administered 2017-12-02 – 2017-12-03 (×3): 1 g via INTRAVENOUS
  Filled 2017-12-02 (×3): qty 50

## 2017-12-02 MED ORDER — HEPARIN (PORCINE) IN NACL 1000-0.9 UT/500ML-% IV SOLN
INTRAVENOUS | Status: AC
  Filled 2017-12-02: qty 500

## 2017-12-02 MED ORDER — CHLORHEXIDINE GLUCONATE CLOTH 2 % EX PADS
6.0000 | MEDICATED_PAD | Freq: Every day | CUTANEOUS | Status: DC
  Administered 2017-12-03: 6 via TOPICAL

## 2017-12-02 MED ORDER — LIDOCAINE HCL (PF) 1 % IJ SOLN
INTRAMUSCULAR | Status: DC | PRN
  Administered 2017-12-02: 80 mL

## 2017-12-02 MED ORDER — ONDANSETRON HCL 4 MG/2ML IJ SOLN: 4.0000 mg | Freq: Four times a day (QID) | INTRAMUSCULAR | Status: DC | PRN

## 2017-12-02 MED ORDER — LATANOPROST 0.005 % OP SOLN
1.0000 [drp] | Freq: Every day | OPHTHALMIC | Status: DC
  Administered 2017-12-02: 1 [drp] via OPHTHALMIC
  Filled 2017-12-02: qty 2.5

## 2017-12-02 MED ORDER — HEPARIN (PORCINE) IN NACL 2-0.9 UNITS/ML
INTRAMUSCULAR | Status: AC | PRN
  Administered 2017-12-02 (×2): 500 mL

## 2017-12-02 MED ORDER — SODIUM CHLORIDE 0.9% FLUSH: 3.0000 mL | INTRAVENOUS | Status: DC | PRN

## 2017-12-02 MED ORDER — DORZOLAMIDE HCL 2 % OP SOLN
1.0000 [drp] | Freq: Two times a day (BID) | OPHTHALMIC | Status: DC
  Administered 2017-12-02 – 2017-12-03 (×2): 1 [drp] via OPHTHALMIC
  Filled 2017-12-02: qty 10

## 2017-12-02 MED ORDER — SODIUM CHLORIDE 0.9 % IV SOLN
INTRAVENOUS | Status: AC
  Filled 2017-12-02: qty 2

## 2017-12-02 MED ORDER — CEFAZOLIN SODIUM-DEXTROSE 2-4 GM/100ML-% IV SOLN
2.0000 g | INTRAVENOUS | Status: AC
  Administered 2017-12-02: 2 g via INTRAVENOUS

## 2017-12-02 MED ORDER — SODIUM CHLORIDE 0.9 % IV SOLN: 250.0000 mL | INTRAVENOUS | Status: DC | PRN

## 2017-12-02 MED ORDER — CEFAZOLIN SODIUM-DEXTROSE 2-4 GM/100ML-% IV SOLN
INTRAVENOUS | Status: AC
  Filled 2017-12-02: qty 100

## 2017-12-02 MED ORDER — FENTANYL CITRATE (PF) 100 MCG/2ML IJ SOLN
INTRAMUSCULAR | Status: DC | PRN
  Administered 2017-12-02 (×2): 12.5 ug via INTRAVENOUS

## 2017-12-02 MED ORDER — SODIUM CHLORIDE 0.9 % IV SOLN
INTRAVENOUS | Status: DC
  Administered 2017-12-02: 12:00:00 via INTRAVENOUS

## 2017-12-02 MED ORDER — SODIUM CHLORIDE 0.9% FLUSH
3.0000 mL | Freq: Two times a day (BID) | INTRAVENOUS | Status: DC
  Administered 2017-12-02 – 2017-12-03 (×2): 3 mL via INTRAVENOUS

## 2017-12-02 SURGICAL SUPPLY — 8 items
CABLE SURGICAL S-101-97-12 (CABLE) ×3 IMPLANT
LEAD TENDRIL MRI 46CM LPA1200M (Lead) ×3 IMPLANT
LEAD TENDRIL MRI 58CM LPA1200M (Lead) ×3 IMPLANT
PACEMAKER ASSURITY DR-RF (Pacemaker) ×3 IMPLANT
PAD PRO RADIOLUCENT 2001M-C (PAD) ×3 IMPLANT
SHEATH CLASSIC 8F (SHEATH) ×6 IMPLANT
SHEATH PINNACLE 6F 10CM (SHEATH) ×3 IMPLANT
TRAY PACEMAKER INSERTION (PACKS) ×3 IMPLANT

## 2017-12-02 NOTE — Progress Notes (Signed)
PT noted on telemetry hr up to 180's, pt was getting back in bed with tech andson. "I went to the bathroom and had BM and threw up" pt said he does that sometimes, pt denies cp sob or  Palps, no distress noted, pt assisted in bed, bp stable, hr still 110's, paged Bhagat PA on call, pt s/p pacemaker today for SSS

## 2017-12-02 NOTE — Progress Notes (Signed)
Hr of 110-180s. Asymptomatic. Will continue to monitor.

## 2017-12-02 NOTE — Interval H&P Note (Signed)
History and Physical Interval Note:  12/02/2017 1:05 PM  [name]  has presented today for surgery, with the diagnosis of bradicardia  The various methods of treatment have been discussed with the patient and family. After consideration of risks, benefits and other options for treatment, the patient has consented to  Procedure(s): PACEMAKER IMPLANT (N/A) as a surgical intervention .  The patient's history has been reviewed, patient examined, no change in status, stable for surgery.  I have reviewed the patient's chart and labs.  Questions were answered to the patient's satisfaction.     [name]

## 2017-12-03 ENCOUNTER — Encounter (HOSPITAL_COMMUNITY): Payer: Self-pay | Admitting: Internal Medicine

## 2017-12-03 ENCOUNTER — Ambulatory Visit (HOSPITAL_COMMUNITY): Payer: Medicare Other

## 2017-12-03 ENCOUNTER — Telehealth: Payer: Self-pay | Admitting: Internal Medicine

## 2017-12-03 DIAGNOSIS — G4733 Obstructive sleep apnea (adult) (pediatric): Secondary | ICD-10-CM | POA: Diagnosis not present

## 2017-12-03 DIAGNOSIS — I480 Paroxysmal atrial fibrillation: Secondary | ICD-10-CM | POA: Diagnosis not present

## 2017-12-03 DIAGNOSIS — K219 Gastro-esophageal reflux disease without esophagitis: Secondary | ICD-10-CM | POA: Diagnosis not present

## 2017-12-03 DIAGNOSIS — R609 Edema, unspecified: Secondary | ICD-10-CM | POA: Diagnosis not present

## 2017-12-03 DIAGNOSIS — I495 Sick sinus syndrome: Secondary | ICD-10-CM

## 2017-12-03 DIAGNOSIS — I714 Abdominal aortic aneurysm, without rupture: Secondary | ICD-10-CM | POA: Diagnosis not present

## 2017-12-03 DIAGNOSIS — Z79899 Other long term (current) drug therapy: Secondary | ICD-10-CM | POA: Diagnosis not present

## 2017-12-03 DIAGNOSIS — Z882 Allergy status to sulfonamides status: Secondary | ICD-10-CM | POA: Diagnosis not present

## 2017-12-03 DIAGNOSIS — Z950 Presence of cardiac pacemaker: Secondary | ICD-10-CM | POA: Diagnosis not present

## 2017-12-03 DIAGNOSIS — N400 Enlarged prostate without lower urinary tract symptoms: Secondary | ICD-10-CM | POA: Diagnosis not present

## 2017-12-03 DIAGNOSIS — I10 Essential (primary) hypertension: Secondary | ICD-10-CM | POA: Diagnosis not present

## 2017-12-03 DIAGNOSIS — R0602 Shortness of breath: Secondary | ICD-10-CM | POA: Diagnosis not present

## 2017-12-03 DIAGNOSIS — Z7901 Long term (current) use of anticoagulants: Secondary | ICD-10-CM | POA: Diagnosis not present

## 2017-12-03 LAB — BASIC METABOLIC PANEL
ANION GAP: 6 (ref 5–15)
BUN: 11 mg/dL (ref 8–23)
CALCIUM: 9 mg/dL (ref 8.9–10.3)
CHLORIDE: 108 mmol/L (ref 98–111)
CO2: 25 mmol/L (ref 22–32)
Creatinine, Ser: 0.96 mg/dL (ref 0.61–1.24)
GFR calc non Af Amer: >60 mL/min (ref >60)
GLUCOSE: 99 mg/dL (ref 70–99)
Potassium: 3.8 mmol/L (ref 3.5–5.1)
Sodium: 139 mmol/L (ref 135–145)

## 2017-12-03 MED ORDER — RIVAROXABAN 20 MG PO TABS: 20.0000 mg | ORAL_TABLET | Freq: Every day | ORAL | 6 refills | Status: DC

## 2017-12-03 NOTE — Telephone Encounter (Signed)
Spoke with [name], antibiotics not routinely prescribed s/p PPM implant. Keep scheduled appointment 12/15/17. Pt verbalizes understanding.

## 2017-12-03 NOTE — Discharge Summary (Addendum)
ELECTROPHYSIOLOGY PROCEDURE DISCHARGE SUMMARY    Patient ID: [name],  MRN: [id number], DOB/AGE: [birth year] 82 y.o.  Admit date: 12/02/2017 Discharge date: 12/03/2017  Primary Care Physician: Curlene Labrum, MD Primary Cardiologist: Bronson Ing Electrophysiologist: Marian Grandt  Primary Discharge Diagnosis:  Symptomatic bradycardia status post pacemaker implantation this admission  Secondary Discharge Diagnosis:  1.  Paroxysmal atrial fibrillation 2.  HTN 3.  GERD 4.  OSA 5.  BPH  Allergies  Allergen Reactions  . Flexeril [Cyclobenzaprine Hcl] Nausea And Vomiting  . Percocet [Oxycodone-Acetaminophen] Nausea And Vomiting  . Vicodin [Hydrocodone-Acetaminophen] Nausea And Vomiting  . Sulfonamide Derivatives Rash     Procedures This Admission:  1.  Implantation of a STJ dual chamber PPM on 12/02/17 by Dr Rayann Heman.  See op note for full details. There were no immediate post procedure complications. 2.  CXR on 12/03/17 demonstrated no pneumothorax status post device implantation.   Brief HPI: [name] is a 84 y.o. male was referred to electrophysiology in the outpatient setting for consideration of PPM implantation.  Past medical history includes symptomatic bradycardia and AF with RVR.  The patient has had symptomatic bradycardia without reversible causes identified.  Risks, benefits, and alternatives to PPM implantation were reviewed with the patient who wished to proceed.   Hospital Course:  The patient was admitted and underwent implantation of a STJ dual chamber PPM with details as outlined above.  He  was monitored on telemetry overnight which demonstrated AF -> atrial pacing with frequent PAC's.  Left chest was without hematoma or ecchymosis.  The device was interrogated and found to be functioning normally.  CXR was obtained and demonstrated no pneumothorax status post device implantation.  Wound care, arm mobility, and restrictions were reviewed with the  patient.  The patient was examined and considered stable for discharge to home.   Resume Xarelto on 12/05/17   Physical Exam: Vitals:   12/02/17 1939 12/02/17 1951 12/03/17 0028 12/03/17 0632  BP: 109/84  113/71   Pulse: (!) 124 (!) 121 (!) 59   Resp: 18  18   Temp: (!) 97.5 F (36.4 C)  98.4 F (36.9 C)   TempSrc: Oral  Oral   SpO2: 94%  93%   Weight:    79.4 kg  Height:        GEN- The patient is well appearing, alert and oriented x 3 today.   HEENT: normocephalic, atraumatic; sclera clear, conjunctiva pink; hearing intact; oropharynx clear; neck supple  Lungs- Clear to ausculation bilaterally, normal work of breathing.  No wheezes, rales, rhonchi Heart- Regular rate and rhythm  GI- soft, non-tender, non-distended, bowel sounds present  Extremities- no clubbing, cyanosis, or edema  MS- no significant deformity or atrophy Skin- warm and dry, no rash or lesion, left chest without hematoma/ecchymosis Psych- euthymic mood, full affect Neuro- strength and sensation are intact   Labs:   Lab Results  Component Value Date   WBC 6.8 11/17/2017   HGB 13.7 11/17/2017   HCT 40.7 11/17/2017   MCV 93 11/17/2017   PLT 173 11/17/2017    Recent Labs  Lab 12/03/17 0501  NA 139  K 3.8  CL 108  CO2 25  BUN 11  CREATININE 0.96  CALCIUM 9.0  GLUCOSE 99    Discharge Medications:  Allergies as of 12/03/2017      Reactions   Flexeril [cyclobenzaprine Hcl] Nausea And Vomiting   Percocet [oxycodone-acetaminophen] Nausea And Vomiting   Vicodin [hydrocodone-acetaminophen] Nausea And Vomiting  Sulfonamide Derivatives Rash      Medication List    TAKE these medications   alfuzosin 10 MG 24 hr tablet Commonly known as:  UROXATRAL Take 10 mg by mouth daily with breakfast.   amLODipine 2.5 MG tablet Commonly known as:  NORVASC Take 1 tablet (2.5 mg total) by mouth daily.   CALCIUM-MAGNESIUM-ZINC PO Take 1 tablet by mouth daily.   dorzolamide 2 % ophthalmic  solution Commonly known as:  TRUSOPT Place 1 drop into the right eye 2 (two) times daily.   esomeprazole 20 MG capsule Commonly known as:  NEXIUM Take 20 mg by mouth daily.   finasteride 5 MG tablet Commonly known as:  PROSCAR Take 5 mg by mouth daily.   Fish Oil 1200 MG Caps Take 1,200 mg by mouth daily.   furosemide 20 MG tablet Commonly known as:  LASIX Take 20 mg by mouth daily.   latanoprost 0.005 % ophthalmic solution Commonly known as:  XALATAN Place 1 drop into both eyes at bedtime.   NON FORMULARY Apply 1 application topically daily as needed (pain). CBD Foot Cream   rivaroxaban 20 MG Tabs tablet Commonly known as:  XARELTO Take 1 tablet (20 mg total) by mouth daily with supper. Resume on 12/05/17 What changed:  See the new instructions.   UNABLE TO FIND at bedtime. On CPAP   Vitamin D 400 units capsule Take 400 Units by mouth daily.       Disposition:  Discharge Instructions    Diet - low sodium heart healthy   Complete by:  As directed    Increase activity slowly   Complete by:  As directed      Follow-up Information    [place] [facility] Follow up on 12/15/2017.   Specialty:  Cardiology Why:  at [facility] information: [address] [place]       [name], MD Follow up on 04/03/2018.   Specialty:  Cardiology Why:  at 9:15AM Contact information: [place] [address] [phone]        Herminio Commons, MD Follow up on 02/13/2018.   Specialty:  Cardiology Why:  at [place] information: [place] [address] [phone]           Duration of Discharge Encounter: Greater than 30 minutes including physician time.  Signed, Chanetta Marshall, NP 12/03/2017 9:06 AM  I have seen, examined the patient, and reviewed the above assessment and plan.  Changes to above are made where necessary.  On exam, RRR.  CXR  reveals stable leads, no ptx.  Device interrogation is reviewed and normal.  Co Sign: [name], MD 12/03/2017 9:18 PM

## 2017-12-03 NOTE — Telephone Encounter (Signed)
Received call from Regino Bellow ) son in regards to patient being dischaged from [facility] . States that patient was not given any antibiotics for pace maker site . Please call [phone].

## 2017-12-03 NOTE — Discharge Instructions (Signed)
° ° °  Supplemental Discharge Instructions for  Pacemaker/Defibrillator Patients  Activity No heavy lifting or vigorous activity with your left/right arm for 6 to 8 weeks.  Do not raise your left/right arm above your head for one week.  Gradually raise your affected arm as drawn below.           __        12/07/17                  12/08/17                  12/09/17                 12/10/17  NO DRIVING for   1 week  ; you may begin driving on  93/81/01   .  WOUND CARE - Keep the wound area clean and dry.  Do not get this area wet for one week. No showers for one week; you may shower on  12/10/17   . - The tape/steri-strips on your wound will fall off; do not pull them off.  No bandage is needed on the site.  DO  NOT apply any creams, oils, or ointments to the wound area. - If you notice any drainage or discharge from the wound, any swelling or bruising at the site, or you develop a fever > 101? F after you are discharged home, call the office at once.  Special Instructions - You are still able to use cellular telephones; use the ear opposite the side where you have your pacemaker/defibrillator.  Avoid carrying your cellular phone near your device. - When traveling through airports, show security personnel your identification card to avoid being screened in the metal detectors.  Ask the security personnel to use the hand wand. - Avoid arc welding equipment, MRI testing (magnetic resonance imaging), TENS units (transcutaneous nerve stimulators).  Call the office for questions about other devices. - Avoid electrical appliances that are in poor condition or are not properly grounded. - Microwave ovens are safe to be near or to operate.

## 2017-12-08 ENCOUNTER — Telehealth: Payer: Self-pay | Admitting: Internal Medicine

## 2017-12-08 NOTE — Telephone Encounter (Signed)
°   Patients friend Suanne Marker (on [place]) has questions about medication related to having MRSA.  States upon discharge medication was to be prescribed that goes in his nostrils.

## 2017-12-08 NOTE — Telephone Encounter (Signed)
Returned call.  Advised to use neosporin on q tip bid in Pt nostril.  Friend indicates understanding.

## 2017-12-15 ENCOUNTER — Ambulatory Visit (INDEPENDENT_AMBULATORY_CARE_PROVIDER_SITE_OTHER): Payer: Medicare Other | Admitting: *Deleted

## 2017-12-15 DIAGNOSIS — I495 Sick sinus syndrome: Secondary | ICD-10-CM

## 2017-12-15 NOTE — Progress Notes (Signed)
Wound check appointment. Steri-strips removed. Wound without redness or edema. Incision edges approximated, wound well healed. Normal device function. Thresholds, sensing, and impedances consistent with implant measurements. Device programmed at 3.5V programmed on for extra safety margin until 3 month visit. Histogram distribution appropriate for patient and level of activity. 2.2% AT/AF + Xarelto. No high ventricular rates noted. Patient educated about wound care, arm mobility, lifting restrictions. ROV 04/03/2018 w/ JA/E

## 2017-12-29 DIAGNOSIS — S0100XD Unspecified open wound of scalp, subsequent encounter: Secondary | ICD-10-CM | POA: Diagnosis not present

## 2017-12-29 DIAGNOSIS — C44319 Basal cell carcinoma of skin of other parts of face: Secondary | ICD-10-CM | POA: Diagnosis not present

## 2017-12-31 DIAGNOSIS — M79672 Pain in left foot: Secondary | ICD-10-CM | POA: Diagnosis not present

## 2017-12-31 DIAGNOSIS — L110 Acquired keratosis follicularis: Secondary | ICD-10-CM | POA: Diagnosis not present

## 2017-12-31 DIAGNOSIS — I739 Peripheral vascular disease, unspecified: Secondary | ICD-10-CM | POA: Diagnosis not present

## 2017-12-31 DIAGNOSIS — M79671 Pain in right foot: Secondary | ICD-10-CM | POA: Diagnosis not present

## 2018-01-11 ENCOUNTER — Encounter (HOSPITAL_COMMUNITY): Payer: Self-pay | Admitting: *Deleted

## 2018-01-11 ENCOUNTER — Other Ambulatory Visit: Payer: Self-pay

## 2018-01-11 ENCOUNTER — Observation Stay (HOSPITAL_COMMUNITY)
Admission: EM | Admit: 2018-01-11 | Discharge: 2018-01-13 | Disposition: A | Discharge status: Home / Self Care | Payer: Medicare Other | Attending: Family Medicine | Admitting: Family Medicine

## 2018-01-11 DIAGNOSIS — K219 Gastro-esophageal reflux disease without esophagitis: Secondary | ICD-10-CM | POA: Diagnosis present

## 2018-01-11 DIAGNOSIS — D123 Benign neoplasm of transverse colon: Secondary | ICD-10-CM | POA: Diagnosis not present

## 2018-01-11 DIAGNOSIS — N400 Enlarged prostate without lower urinary tract symptoms: Secondary | ICD-10-CM | POA: Diagnosis not present

## 2018-01-11 DIAGNOSIS — K625 Hemorrhage of anus and rectum: Principal | ICD-10-CM | POA: Diagnosis present

## 2018-01-11 DIAGNOSIS — Z8249 Family history of ischemic heart disease and other diseases of the circulatory system: Secondary | ICD-10-CM | POA: Insufficient documentation

## 2018-01-11 DIAGNOSIS — Z79899 Other long term (current) drug therapy: Secondary | ICD-10-CM | POA: Diagnosis not present

## 2018-01-11 DIAGNOSIS — Z87891 Personal history of nicotine dependence: Secondary | ICD-10-CM | POA: Diagnosis not present

## 2018-01-11 DIAGNOSIS — Z885 Allergy status to narcotic agent status: Secondary | ICD-10-CM | POA: Insufficient documentation

## 2018-01-11 DIAGNOSIS — I495 Sick sinus syndrome: Secondary | ICD-10-CM | POA: Diagnosis present

## 2018-01-11 DIAGNOSIS — D122 Benign neoplasm of ascending colon: Secondary | ICD-10-CM | POA: Insufficient documentation

## 2018-01-11 DIAGNOSIS — D62 Acute posthemorrhagic anemia: Secondary | ICD-10-CM | POA: Insufficient documentation

## 2018-01-11 DIAGNOSIS — Z882 Allergy status to sulfonamides status: Secondary | ICD-10-CM | POA: Diagnosis not present

## 2018-01-11 DIAGNOSIS — Z950 Presence of cardiac pacemaker: Secondary | ICD-10-CM | POA: Diagnosis not present

## 2018-01-11 DIAGNOSIS — K449 Diaphragmatic hernia without obstruction or gangrene: Secondary | ICD-10-CM | POA: Diagnosis not present

## 2018-01-11 DIAGNOSIS — G4733 Obstructive sleep apnea (adult) (pediatric): Secondary | ICD-10-CM | POA: Diagnosis not present

## 2018-01-11 DIAGNOSIS — G8929 Other chronic pain: Secondary | ICD-10-CM | POA: Insufficient documentation

## 2018-01-11 DIAGNOSIS — I714 Abdominal aortic aneurysm, without rupture, unspecified: Secondary | ICD-10-CM | POA: Diagnosis present

## 2018-01-11 DIAGNOSIS — I4891 Unspecified atrial fibrillation: Secondary | ICD-10-CM | POA: Diagnosis present

## 2018-01-11 DIAGNOSIS — K5730 Diverticulosis of large intestine without perforation or abscess without bleeding: Secondary | ICD-10-CM | POA: Insufficient documentation

## 2018-01-11 DIAGNOSIS — K648 Other hemorrhoids: Secondary | ICD-10-CM | POA: Insufficient documentation

## 2018-01-11 DIAGNOSIS — Z7901 Long term (current) use of anticoagulants: Secondary | ICD-10-CM | POA: Insufficient documentation

## 2018-01-11 DIAGNOSIS — I10 Essential (primary) hypertension: Secondary | ICD-10-CM | POA: Diagnosis present

## 2018-01-11 DIAGNOSIS — Z800 Family history of malignant neoplasm of digestive organs: Secondary | ICD-10-CM | POA: Diagnosis not present

## 2018-01-11 DIAGNOSIS — I482 Chronic atrial fibrillation, unspecified: Secondary | ICD-10-CM | POA: Insufficient documentation

## 2018-01-11 LAB — COMPREHENSIVE METABOLIC PANEL
ALBUMIN: 3.5 g/dL (ref 3.5–5.0)
ALT: 12 U/L (ref 0–44)
ALT: 9 U/L (ref 0–44)
AST: 17 U/L (ref 15–41)
AST: 15 U/L (ref 15–41)
Albumin: 3.3 g/dL — ABNORMAL LOW (ref 3.5–5.0)
Alkaline Phosphatase: 49 U/L (ref 38–126)
Alkaline Phosphatase: 48 U/L (ref 38–126)
Anion gap: 7 (ref 5–15)
Anion gap: 7 (ref 5–15)
BILIRUBIN TOTAL: 0.8 mg/dL (ref 0.3–1.2)
BUN: 12 mg/dL (ref 8–23)
BUN: 12 mg/dL (ref 8–23)
CHLORIDE: 107 mmol/L (ref 98–111)
CO2: 24 mmol/L (ref 22–32)
CO2: 24 mmol/L (ref 22–32)
Calcium: 9.1 mg/dL (ref 8.9–10.3)
Calcium: 9.1 mg/dL (ref 8.9–10.3)
Chloride: 109 mmol/L (ref 98–111)
Creatinine, Ser: 0.92 mg/dL (ref 0.61–1.24)
Creatinine, Ser: 0.89 mg/dL (ref 0.61–1.24)
GFR calc Af Amer: >60 mL/min (ref >60)
GFR calc Af Amer: >60 mL/min (ref >60)
GFR calc non Af Amer: >60 mL/min (ref >60)
GFR calc non Af Amer: >60 mL/min (ref >60)
GLUCOSE: 125 mg/dL — AB (ref 70–99)
Glucose, Bld: 113 mg/dL — ABNORMAL HIGH (ref 70–99)
POTASSIUM: 3.9 mmol/L (ref 3.5–5.1)
Potassium: 3.8 mmol/L (ref 3.5–5.1)
SODIUM: 138 mmol/L (ref 135–145)
Sodium: 140 mmol/L (ref 135–145)
TOTAL PROTEIN: 6.6 g/dL (ref 6.5–8.1)
Total Bilirubin: 0.9 mg/dL (ref 0.3–1.2)
Total Protein: 6.1 g/dL — ABNORMAL LOW (ref 6.5–8.1)

## 2018-01-11 LAB — CBC
HCT: 35.3 % — ABNORMAL LOW (ref 39.0–52.0)
Hemoglobin: 11.2 g/dL — ABNORMAL LOW (ref 13.0–17.0)
MCH: 29.9 pg (ref 26.0–34.0)
MCHC: 31.7 g/dL (ref 30.0–36.0)
MCV: 94.4 fL (ref 80.0–100.0)
NRBC: 0 % (ref 0.0–0.2)
Platelets: 150 10*3/uL (ref 150–400)
RBC: 3.74 MIL/uL — ABNORMAL LOW (ref 4.22–5.81)
RDW: 12.4 % (ref 11.5–15.5)
WBC: 6 10*3/uL (ref 4.0–10.5)

## 2018-01-11 LAB — CBC WITH DIFFERENTIAL/PLATELET
ABS IMMATURE GRANULOCYTES: 0.01 10*3/uL (ref 0.00–0.07)
Basophils Absolute: 0 10*3/uL (ref 0.0–0.1)
Basophils Relative: 1 %
Eosinophils Absolute: 0.2 10*3/uL (ref 0.0–0.5)
Eosinophils Relative: 3 %
HEMATOCRIT: 38.5 % — AB (ref 39.0–52.0)
HEMOGLOBIN: 12.4 g/dL — AB (ref 13.0–17.0)
Immature Granulocytes: 0 %
LYMPHS ABS: 2.3 10*3/uL (ref 0.7–4.0)
LYMPHS PCT: 38 %
MCH: 30.2 pg (ref 26.0–34.0)
MCHC: 32.2 g/dL (ref 30.0–36.0)
MCV: 93.7 fL (ref 80.0–100.0)
MONO ABS: 0.6 10*3/uL (ref 0.1–1.0)
MONOS PCT: 10 %
NEUTROS ABS: 2.9 10*3/uL (ref 1.7–7.7)
NEUTROS PCT: 48 %
Platelets: 166 10*3/uL (ref 150–400)
RBC: 4.11 MIL/uL — ABNORMAL LOW (ref 4.22–5.81)
RDW: 12.4 % (ref 11.5–15.5)
WBC: 6 10*3/uL (ref 4.0–10.5)
nRBC: 0 % (ref 0.0–0.2)

## 2018-01-11 LAB — POC OCCULT BLOOD, ED: Fecal Occult Bld: POSITIVE — AB

## 2018-01-11 LAB — HEMOGLOBIN AND HEMATOCRIT, BLOOD
HCT: 35.4 % — ABNORMAL LOW (ref 39.0–52.0)
Hemoglobin: 11.4 g/dL — ABNORMAL LOW (ref 13.0–17.0)

## 2018-01-11 LAB — TYPE AND SCREEN
ABO/RH(D): AB POS
ANTIBODY SCREEN: NEGATIVE

## 2018-01-11 LAB — PROTIME-INR
INR: 2
Prothrombin Time: 22.5 seconds — ABNORMAL HIGH (ref 11.4–15.2)

## 2018-01-11 MED ORDER — DORZOLAMIDE HCL 2 % OP SOLN
1.0000 [drp] | Freq: Two times a day (BID) | OPHTHALMIC | Status: DC
  Administered 2018-01-11 – 2018-01-12 (×3): 1 [drp] via OPHTHALMIC
  Filled 2018-01-11: qty 10

## 2018-01-11 MED ORDER — AMLODIPINE BESYLATE 5 MG PO TABS
2.5000 mg | ORAL_TABLET | Freq: Every day | ORAL | Status: DC
  Administered 2018-01-12: 2.5 mg via ORAL
  Filled 2018-01-11 (×2): qty 1

## 2018-01-11 MED ORDER — ALFUZOSIN HCL ER 10 MG PO TB24
10.0000 mg | ORAL_TABLET | Freq: Every day | ORAL | Status: DC
  Administered 2018-01-12: 10 mg via ORAL
  Filled 2018-01-11 (×4): qty 1

## 2018-01-11 MED ORDER — SODIUM CHLORIDE 0.9 % IV SOLN
INTRAVENOUS | Status: DC
  Administered 2018-01-11 – 2018-01-12 (×2): via INTRAVENOUS

## 2018-01-11 MED ORDER — FINASTERIDE 5 MG PO TABS
5.0000 mg | ORAL_TABLET | Freq: Every day | ORAL | Status: DC
  Administered 2018-01-12: 5 mg via ORAL
  Filled 2018-01-11 (×4): qty 1

## 2018-01-11 MED ORDER — LATANOPROST 0.005 % OP SOLN
1.0000 [drp] | Freq: Every day | OPHTHALMIC | Status: DC
  Administered 2018-01-11 – 2018-01-12 (×2): 1 [drp] via OPHTHALMIC
  Filled 2018-01-11: qty 2.5

## 2018-01-11 NOTE — ED Notes (Signed)
No BM at this time

## 2018-01-11 NOTE — ED Triage Notes (Signed)
Pt c/o  3 episodes of dark red blood with his bowel  movements tonight along with lower abd pain,

## 2018-01-11 NOTE — ED Provider Notes (Signed)
Emergency Department Provider Note   I have reviewed the triage vital signs and the nursing notes.   HISTORY  Chief Complaint Rectal Bleeding   HPI [name] is a 84 y.o. male with multiple medical problems documented below the presents to the emergency department today secondary to rectal bleeding.  Patient has atrial fibrillation is on Xarelto is at 3 episodes of blood in his stool in the last hour.  The first was dark maroon but then they became more bright red and prominent since then.  No lightheadedness or dizziness.  No chest pain or shortness of breath.  No abdominal pain.  Unknown last colonoscopy is GI doctor is Dr. Laural Golden.  No other associated or exacerbating symptoms. No other associated or modifying symptoms.    Past Medical History:  Diagnosis Date  . AAA (abdominal aortic aneurysm) ([place])   . Atrial fibrillation ([facility])    bicarbonate perfusion study 10/11 EF 69%. small defects no ischemia. 7 metastases acheived. ER visit 11/10/09 ruled out MI normal sinus rhythm orthostasis after sublingual nitroglycerin   . BPH (benign prostatic hyperplasia)   . Chronic back pain   . GERD (gastroesophageal reflux disease)   . Hypertension   . Orthostatic hypotension   . PONV (postoperative nausea and vomiting)   . Sleep apnea    CPAP at night  . Stress fracture of foot    left  . Swelling of both lower extremities     Patient Active Problem List   Diagnosis Date Noted  . Sick sinus syndrome ([place]) 12/02/2017  . Generalized weakness 09/25/2016  . Shortness of breath 09/25/2016  . Colitis 11/17/2014  . Colonic adenoma 08/11/2012  . Preoperative clearance 08/16/2010  . LEG EDEMA 05/10/2009  . DYSLIPIDEMIA 01/24/2009  . HIATAL HERNIA WITH REFLUX 01/24/2009  . NONSPEC ABN FINDNG RAD & OTH EXAM ABDOMINAL AREA 11/17/2008  . ATRIAL FIBRILLATION 09/21/2008  . ABDOMINAL AORTIC ANEURYSM 09/21/2008  . ORTHOSTATIC HYPOTENSION 09/21/2008    Past Surgical History:    Procedure Laterality Date  . CHOLECYSTECTOMY  2012   [facility] by Dr. Geroge Baseman  . COLONOSCOPY N/A 08/27/2012   Procedure: COLONOSCOPY;  Surgeon: Rogene Houston, MD;  Location: [facility];  Service: Endoscopy;  Laterality: N/A;  325  . HERNIA REPAIR Bilateral    Inguinal and umbilical  . INCISIONAL HERNIA REPAIR N/A 01/31/2014   Procedure: Fatima Blank HERNIORRHAPHY WITH MESH;  Surgeon: Jamesetta So, MD;  Location: [facility] ORS;  Service: General;  Laterality: N/A;  . INSERTION OF MESH N/A 01/31/2014   Procedure: INSERTION OF MESH;  Surgeon: Jamesetta So, MD;  Location: [facility] ORS;  Service: General;  Laterality: N/A;  . PACEMAKER IMPLANT N/A 12/02/2017   Procedure: PACEMAKER IMPLANT;  Surgeon: [name], MD;  Location: [facility];  Service: Cardiovascular;  Laterality: N/A;  . SHOULDER SURGERY Right 2005?   for open rotator cuff repair. [place]  . WOUND DEBRIDEMENT Right 01/31/2014   Procedure: DEBRIDEMENT WOUND RIGHT LEG;  Surgeon: Jamesetta So, MD;  Location: [facility] ORS;  Service: General;  Laterality: Right;  . WRIST FRACTURE SURGERY Right     Current Outpatient Rx  . Order #: [id number] Class: Historical Med  . Order #: [id number] Class: Normal  . Order #: [id number] Class: Historical Med  . Order #: [id number] Class: Historical Med  . Order #: [id number] Class: Historical Med  . Order #: [id number] Class: Historical Med  . Order #: [id number] Class: Historical Med  . Order #: [id number] Class:  Historical Med  . Order #: [id number] Class: Historical Med  . Order #: [id number] Class: Historical Med  . Order #: [id number] Class: Historical Med  . Order #: [id number] Class: Normal  . Order #: [id number] Class: Historical Med    Allergies Flexeril [cyclobenzaprine hcl]; Percocet [oxycodone-acetaminophen]; Vicodin [hydrocodone-acetaminophen]; and Sulfonamide derivatives  Family History  Problem Relation Age of Onset  . Heart failure Mother   . Heart failure Father   . Cancer Sister      Social History Social History   Tobacco Use  . Smoking status: Former Smoker    Packs/day: 1.00    Years: 10.00    Pack years: 10.00    Types: Cigarettes    Start date: 10/15/1943    Last attempt to quit: 01/28/1954    Years since quitting: 63.9  . Smokeless tobacco: Never Used  . Tobacco comment: tobacco use - no  Substance Use Topics  . Alcohol use: No    Alcohol/week: 0.0 standard drinks  . Drug use: No    Review of Systems  All other systems negative except as documented in the HPI. All pertinent positives and negatives as reviewed in the HPI. ____________________________________________   PHYSICAL EXAM:  VITAL SIGNS: ED Triage Vitals [01/11/18 0027]  Enc Vitals Group     BP 138/69     Pulse Rate 82     Resp (!) 21     Temp 98 F (36.7 C)     Temp Source Oral     SpO2 92 %     Weight 178 lb (80.7 kg)     Height 5\' 8"  (1.727 m)     Head Circumference      Peak Flow      Pain Score 0     Pain Loc      Pain Edu?      Excl. in [place]?     Constitutional: Alert and oriented. Well appearing and in no acute distress. Eyes: Conjunctivae are normal. PERRL. EOMI. Head: Atraumatic. Nose: No congestion/rhinnorhea. Mouth/Throat: Mucous membranes are moist.  Oropharynx non-erythematous. Neck: No stridor.  No meningeal signs.   Cardiovascular: Normal rate, regular rhythm. Good peripheral circulation. Grossly normal heart sounds.   Respiratory: Normal respiratory effort.  No retractions. Lungs CTAB. Gastrointestinal: Soft and nontender. No distention.  Musculoskeletal: No lower extremity tenderness nor edema. No gross deformities of extremities. Neurologic:  Normal speech and language. No gross focal neurologic deficits are appreciated.  Skin:  Skin is warm, dry and intact. No rash noted.   ____________________________________________   LABS (all labs ordered are listed, but only abnormal results are displayed)  Labs Reviewed  COMPREHENSIVE METABOLIC PANEL -  Abnormal; Notable for the following components:      Result Value   Glucose, Bld 125 (*)    All other components within normal limits  CBC WITH DIFFERENTIAL/PLATELET - Abnormal; Notable for the following components:   RBC 4.11 (*)    Hemoglobin 12.4 (*)    HCT 38.5 (*)    All other components within normal limits  PROTIME-INR - Abnormal; Notable for the following components:   Prothrombin Time 22.5 (*)    All other components within normal limits  POC OCCULT BLOOD, ED - Abnormal; Notable for the following components:   Fecal Occult Bld POSITIVE (*)    All other components within normal limits  OCCULT BLOOD X 1 CARD TO LAB, STOOL  TYPE AND SCREEN   ____________________________________________  EKG   EKG Interpretation  Date/Time:    Ventricular Rate:  PR Interval:    QRS Duration:   QT Interval:    QTC Calculation:   R Axis:     Text Interpretation:         ____________________________________________  RADIOLOGY  No results found.  ____________________________________________   PROCEDURES  Procedure(s) performed:   Procedures   ____________________________________________   INITIAL IMPRESSION / ASSESSMENT AND PLAN / ED COURSE  eval for severity of GI bleed. Likely admit.   CBC ok. Vital stable. Slightly elevated INR. Will admit.      Pertinent labs & imaging results that were available during my care of the patient were reviewed by me and considered in my medical decision making (see chart for details).  ____________________________________________  FINAL CLINICAL IMPRESSION(S) / ED DIAGNOSES  Final diagnoses:  Rectal bleeding     MEDICATIONS GIVEN DURING THIS VISIT:  Medications - No data to display   NEW OUTPATIENT MEDICATIONS STARTED DURING THIS VISIT:  New Prescriptions   No medications on file    Note:  This note was prepared with assistance of Dragon voice recognition software. Occasional wrong-word or sound-a-like  substitutions may have occurred due to the inherent limitations of voice recognition software.   Dula Havlik, Corene Cornea, MD 01/11/18 [id number]

## 2018-01-11 NOTE — Progress Notes (Addendum)
PROGRESS NOTE  Brief Narrative: [name] is an active 84 y.o. 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 This started abruptly on the evening of 12/14 with fecal urgency and subsequent passing of dark red blood that has continued intermittently since admission. On admission this morning he was mildly anemic and hgb has trended downward. He denies symptoms attributable to anemia. GI consultation is pending and xarelto is held (last dose ~8am 12/14).   Subjective: Had last BM which has stool, dark matter, and some red blood around noon. No abdominal or rectal pain, no nausea or vomiting. No chest pain, dyspnea, palpitations, lightheadedness. He bruises frequently at baseline, though denies unusual bleeding or bruising otherwise.  Objective: BP (!) 106/57   Pulse 60   Temp 97.9 F (36.6 C) (Oral)   Resp 18   Ht 5\' 8"  (1.727 m)   Wt 81.8 kg   SpO2 93%   BMI 27.42 kg/m   Gen: Pleasant, elderly male in no distress Pulm: Clear and nonlabored on room air  CV: RRR, no murmur, no JVD, no edema GI: Soft, NT, ND, +BS. Neuro: Alert and oriented. No focal deficits. Skin: Scattered small ecchymoses on arms/hands, otherwise no lesions.  Assessment & Plan: Active elderly male presented with GI bleeding in the setting of anticoagulation. Still some bleeding with progressive anemia, though vital signs are stable and he has no symptoms of anemia. He believes his last colonoscopy was 2014 showing only polyps with no recommended follow up but is unsure of this and unsure of the performing physician.  - Continue to hold anticoagulation, check H/H this PM and again in AM or prn increased bleeding.  - He is NPO pending GI recommendations.   Otherwise, please see H&P from this morning by Dr. Darrick Meigs for other plans.   Patrecia Pour, MD 01/11/2018, 1:40 PM

## 2018-01-11 NOTE — H&P (Signed)
TRH H&P    Patient Demographics:    [name], is a 84 y.o. male  MRN: [id number]  DOB - 13-Aug-1926  Admit Date - 01/11/2018  Referring MD/NP/PA: Merrily Pew  Outpatient Primary MD for the patient is Burdine, Virgina Evener, MD  Patient coming from: Home  Chief complaint-rectal bleeding   HPI:    [name]  is a 84 y.o. male, with history of atrial fibrillation on Xarelto, status post pacemaker placement for symptomatic bradycardia, hypertension, BPH, GERD, abdominal aortic aneurysm 4.8 cm, followed by vascular surgery team to hospital with complaints of rectal bleeding which started last night.  Patient says that he had 4 episodes of blood in the stool.  The first was bright red but then it became dark in color.  He denies passing out.  No dizziness.  No chest pain or shortness of breath.  Denies abdominal pain.  No vomiting of blood.  Patient says that last colonoscopy was in 2014, results are not available at this time. He denies dysuria No previous history of stroke or seizures. No history of hemorrhoids, no previous history of rectal bleeding    Review of systems:    In addition to the HPI above,    All other systems reviewed and are negative.    Past History of the following :    Past Medical History:  Diagnosis Date  . AAA (abdominal aortic aneurysm) ([place])   . Atrial fibrillation (HCC)    bicarbonate perfusion study 10/11 EF 69%. small defects no ischemia. 7 metastases acheived. ER visit 11/10/09 ruled out MI normal sinus rhythm orthostasis after sublingual nitroglycerin   . BPH (benign prostatic hyperplasia)   . Chronic back pain   . GERD (gastroesophageal reflux disease)   . Hypertension   . Orthostatic hypotension   . PONV (postoperative nausea and vomiting)   . Sleep apnea    CPAP at night  . Stress fracture of foot    left  . Swelling of both lower extremities       Past  Surgical History:  Procedure Laterality Date  . CHOLECYSTECTOMY  2012   [facility] by Dr. Geroge Baseman  . COLONOSCOPY N/A 08/27/2012   Procedure: COLONOSCOPY;  Surgeon: Rogene Houston, MD;  Location: [facility];  Service: Endoscopy;  Laterality: N/A;  325  . HERNIA REPAIR Bilateral    Inguinal and umbilical  . INCISIONAL HERNIA REPAIR N/A 01/31/2014   Procedure: Fatima Blank HERNIORRHAPHY WITH MESH;  Surgeon: Jamesetta So, MD;  Location: [facility] ORS;  Service: General;  Laterality: N/A;  . INSERTION OF MESH N/A 01/31/2014   Procedure: INSERTION OF MESH;  Surgeon: Jamesetta So, MD;  Location: [facility] ORS;  Service: General;  Laterality: N/A;  . PACEMAKER IMPLANT N/A 12/02/2017   Procedure: PACEMAKER IMPLANT;  Surgeon: [name], MD;  Location: [place] [facility];  Service: Cardiovascular;  Laterality: N/A;  . SHOULDER SURGERY Right 2005?   for open rotator cuff repair. [place]  . WOUND DEBRIDEMENT Right 01/31/2014   Procedure: DEBRIDEMENT  WOUND RIGHT LEG;  Surgeon: Jamesetta So, MD;  Location: [facility] OR[facility];  Service: General;  Laterality: Right;  . WRIST FRACTURE SURGERY Right       Social History:      Social History   Tobacco Use  . Smoking status: Former Smoker    Packs/day: 1.00    Years: 10.00    Pack years: 10.00    Types: Cigarettes    Start date: 10/15/1943    Last attempt to quit: 01/28/1954    Years since quitting: 63.9  . Smokeless tobacco: Never Used  . Tobacco comment: tobacco use - no  Substance Use Topics  . Alcohol use: No    Alcohol/week: 0.0 standard drinks       Family History :     Family History  Problem Relation Age of Onset  . Heart failure Mother   . Heart failure Father   . Cancer Sister       Home Medications:   Prior to Admission medications   Medication Sig Start Date End Date Taking? Authorizing Provider  alfuzosin (UROXATRAL) 10 MG 24 hr tablet Take 10 mg by mouth daily with breakfast.    [provider]  amLODipine  (NORVASC) 2.5 MG tablet Take 1 tablet (2.5 mg total) by mouth daily. 12/18/16 12/15/17  Allred, Jeneen Rinks, MD  CALCIUM-MAGNESIUM-ZINC PO Take 1 tablet by mouth daily.    [provider]  Cholecalciferol (VITAMIN D) 400 UNITS capsule Take 400 Units by mouth daily.     [provider]  dorzolamide (TRUSOPT) 2 % ophthalmic solution Place 1 drop into the right eye 2 (two) times daily.     [provider]  esomeprazole (NEXIUM) 20 MG capsule Take 20 mg by mouth daily.    [provider]  finasteride (PROSCAR) 5 MG tablet Take 5 mg by mouth daily.      [provider]  furosemide (LASIX) 20 MG tablet Take 20 mg by mouth daily.     [provider]  latanoprost (XALATAN) 0.005 % ophthalmic solution Place 1 drop into both eyes at bedtime.     [provider]  NON FORMULARY Apply 1 application topically daily as needed (pain). CBD Foot Cream    [provider]  Omega-3 Fatty Acids (FISH OIL) 1200 MG CAPS Take 1,200 mg by mouth daily.     [provider]  rivaroxaban (XARELTO) 20 MG TABS tablet Take 1 tablet (20 mg total) by mouth daily with supper. Resume on 12/05/17 12/03/17   Patsey Berthold, NP  UNABLE TO FIND at bedtime. On CPAP    [provider]     Allergies:     Allergies  Allergen Reactions  . Flexeril [Cyclobenzaprine Hcl] Nausea And Vomiting  . Percocet [Oxycodone-Acetaminophen] Nausea And Vomiting  . Vicodin [Hydrocodone-Acetaminophen] Nausea And Vomiting  . Sulfonamide Derivatives Rash     Physical Exam:   Vitals  Blood pressure 131/79, pulse (!) 59, temperature 98 F (36.7 C), temperature source Oral, resp. rate (!) 21, height 5\' 8"  (1.727 m), weight 80.7 kg, SpO2 93 %.  1.  General: Appears in no acute distress  2. Psychiatric:  Intact judgement and  insight, awake alert, oriented x 3.  3. Neurologic: No focal neurological deficits, all cranial nerves intact.Strength 5/5 all 4 extremities,  sensation intact all 4 extremities, plantars down going.  4. Eyes :  anicteric sclerae, moist conjunctivae with no lid lag. PERRLA.  5. ENMT:  Oropharynx clear with moist mucous  membranes and good dentition  6. Neck:  supple, no cervical lymphadenopathy appriciated, No thyromegaly  7. Respiratory : Normal respiratory effort, good air movement bilaterally,clear to  auscultation bilaterally  8. Cardiovascular : RRR, no gallops, rubs or murmurs, trace edema in the lower extremities left more than right  9. Gastrointestinal:  Positive bowel sounds, soft, nontender, no organomegaly.  10. Skin:  No cyanosis, normal texture and turgor, no rash, lesions or ulcers  11.Musculoskeletal:  Good muscle tone,  joints appear normal , no effusions,  normal range of motion    Data Review:    CBC Recent Labs  Lab 01/11/18 0103  WBC 6.0  HGB 12.4*  HCT 38.5*  PLT 166  MCV 93.7  MCH 30.2  MCHC 32.2  RDW 12.4  LYMPHSABS 2.3  MONOABS 0.6  EOSABS 0.2  BASOSABS 0.0   ------------------------------------------------------------------------------------------------------------------  Results for orders placed or performed during the hospital encounter of 01/11/18 (from the past 48 hour(s))  Comprehensive metabolic panel     Status: Abnormal   Collection Time: 01/11/18  1:03 AM  Result Value Ref Range   Sodium 138 135 - 145 mmol/L   Potassium 3.8 3.5 - 5.1 mmol/L   Chloride 107 98 - 111 mmol/L   CO2 24 22 - 32 mmol/L   Glucose, Bld 125 (H) 70 - 99 mg/dL   BUN 12 8 - 23 mg/dL   Creatinine, Ser 0.92 0.61 - 1.24 mg/dL   Calcium 9.1 8.9 - 10.3 mg/dL   Total Protein 6.6 6.5 - 8.1 g/dL   Albumin 3.5 3.5 - 5.0 g/dL   AST 17 15 - 41 U/L   ALT 12 0 - 44 U/L   Alkaline Phosphatase 49 38 - 126 U/L   Total Bilirubin 0.8 0.3 - 1.2 mg/dL   GFR calc non Af Amer >60 >60 mL/min   GFR calc Af Amer >60 >60 mL/min   Anion gap 7 5 - 15    Comment: Performed at [facility], [address]  [place], [place] [address]  CBC WITH DIFFERENTIAL     Status: Abnormal   Collection Time: 01/11/18  1:03 AM  Result Value Ref Range   WBC 6.0 4.0 - 10.5 K/uL   RBC 4.11 (L) 4.22 - 5.81 MIL/uL   Hemoglobin 12.4 (L) 13.0 - 17.0 g/dL   HCT 38.5 (L) 39.0 - 52.0 %   MCV 93.7 80.0 - 100.0 fL   MCH 30.2 26.0 - 34.0 pg   MCHC 32.2 30.0 - 36.0 g/dL   RDW 12.4 11.5 - 15.5 %   Platelets 166 150 - 400 K/uL   nRBC 0.0 0.0 - 0.2 %   Neutrophils Relative % 48 %   Neutro Abs 2.9 1.7 - 7.7 K/uL   Lymphocytes Relative 38 %   Lymphs Abs 2.3 0.7 - 4.0 K/uL   Monocytes Relative 10 %   Monocytes Absolute 0.6 0.1 - 1.0 K/uL   Eosinophils Relative 3 %   Eosinophils Absolute 0.2 0.0 - 0.5 K/uL   Basophils Relative 1 %   Basophils Absolute 0.0 0.0 - 0.1 K/uL   Immature Granulocytes 0 %   Abs Immature Granulocytes 0.01 0.00 - 0.07 K/uL    Comment: Performed at [facility], [address] [place], [place] [address]  Protime-INR     Status: Abnormal   Collection Time: 01/11/18  1:03 AM  Result Value Ref Range   Prothrombin Time 22.5 (H) 11.4 - 15.2 seconds   INR 2.00  Comment: Performed at [facility], [address] [place], [place] [address]  Type and screen [facility]     Status: None   Collection Time: 01/11/18  1:03 AM  Result Value Ref Range   ABO/RH(D) AB POS    Antibody Screen NEG    Sample Expiration      01/14/2018 Performed at [facility], [address], [place],  [address]   POC occult blood, ED     Status: Abnormal   Collection Time: 01/11/18  2:39 AM  Result Value Ref Range   Fecal Occult Bld POSITIVE (A) NEGATIVE    Chemistries  Recent Labs  Lab 01/11/18 0103  NA 138  K 3.8  CL 107  CO2 24  GLUCOSE 125*  BUN 12  CREATININE 0.92  CALCIUM 9.1  AST 17  ALT 12  ALKPHOS 49  BILITOT 0.8    ------------------------------------------------------------------------------------------------------------------  ------------------------------------------------------------------------------------------------------------------ GFR: Estimated Creatinine Clearance: 52.7 mL/min (by C-G formula based on SCr of 0.92 mg/dL). Liver Function Tests: Recent Labs  Lab 01/11/18 0103  AST 17  ALT 12  ALKPHOS 49  BILITOT 0.8  PROT 6.6  ALBUMIN 3.5   No results for input(s): LIPASE, AMYLASE in the last 168 hours. No results for input(s): AMMONIA in the last 168 hours. Coagulation Profile: Recent Labs  Lab 01/11/18 0103  INR 2.00    --------------------------------------------------------------------------------------------------------------- Urine analysis:    Component Value Date/Time   COLORURINE YELLOW 11/17/2014 [place] 11/17/2014 1634   LABSPEC 1.010 11/17/2014 1634   PHURINE 5.5 11/17/2014 1634   GLUCOSEU NEGATIVE 11/17/2014 1634   HGBUR MODERATE (A) 11/17/2014 1634   BILIRUBINUR NEGATIVE 11/17/2014 1634   KETONESUR NEGATIVE 11/17/2014 1634   PROTEINUR NEGATIVE 11/17/2014 1634   UROBILINOGEN 0.2 11/17/2014 1634   NITRITE NEGATIVE 11/17/2014 1634   LEUKOCYTESUR NEGATIVE 11/17/2014 1634      Imaging Results:    No results found.  My personal review of EKG: Rhythm paced rhythm   Assessment & Plan:    Active Problems:   Rectal bleeding   1. Rectal bleeding-unclear etiology, will place under observation.  Hemoglobin is stable at 12.7.  We will keep him n.p.o. would avoid giving excessive IV fluids as he takes Lasix at home and has lower extremity edema, check CBC in a.m.  Patient is hemodynamically stable, will consult gastroenterology in a.m.  2. Hypertension-continue home medications including amlodipine  3. Atrial fibrillation-heart rate is controlled, patient has pacemaker placed recently.  He is on anticoagulation with Xarelto.  Will hold  Xarelto at this time due to rectal bleeding.  4. BPH-continue finasteride, Uroxatral    DVT Prophylaxis-   SCDs   AM Labs Ordered, also please review Full Orders  Family Communication: Admission, patients condition and plan of care including tests being ordered have been discussed with the patient and his wife at bedside who indicate understanding and agree with the plan and Code Status.  Code Status: Full code  Admission status: Observation: Based on patients clinical presentation and evaluation of above clinical data, I have made determination that patient needs less than 2 midnights in the hospital.  Patient has rectal bleeding, hemoglobin is stable.  GI has been consulted.  Time spent in minutes : 60 minutes   Oswald Hillock M.D on 01/11/2018 at 3:16 AM  Between 7am to 7pm - Pager - [phone]. After 7pm go to [email or web address] - password [facility]   [facility] - Office  [phone]

## 2018-01-12 ENCOUNTER — Encounter (HOSPITAL_COMMUNITY): Payer: Self-pay | Admitting: *Deleted

## 2018-01-12 ENCOUNTER — Encounter (HOSPITAL_COMMUNITY)
Admission: EM | Disposition: A | Discharge status: Home / Self Care | Payer: Self-pay | Source: Home / Self Care | Attending: Emergency Medicine

## 2018-01-12 DIAGNOSIS — K219 Gastro-esophageal reflux disease without esophagitis: Secondary | ICD-10-CM

## 2018-01-12 DIAGNOSIS — K648 Other hemorrhoids: Secondary | ICD-10-CM | POA: Diagnosis not present

## 2018-01-12 DIAGNOSIS — I714 Abdominal aortic aneurysm, without rupture: Secondary | ICD-10-CM

## 2018-01-12 DIAGNOSIS — I495 Sick sinus syndrome: Secondary | ICD-10-CM | POA: Diagnosis not present

## 2018-01-12 DIAGNOSIS — I10 Essential (primary) hypertension: Secondary | ICD-10-CM | POA: Diagnosis not present

## 2018-01-12 DIAGNOSIS — K5730 Diverticulosis of large intestine without perforation or abscess without bleeding: Secondary | ICD-10-CM | POA: Diagnosis not present

## 2018-01-12 DIAGNOSIS — D122 Benign neoplasm of ascending colon: Secondary | ICD-10-CM | POA: Diagnosis not present

## 2018-01-12 DIAGNOSIS — K625 Hemorrhage of anus and rectum: Secondary | ICD-10-CM | POA: Diagnosis not present

## 2018-01-12 DIAGNOSIS — I480 Paroxysmal atrial fibrillation: Secondary | ICD-10-CM

## 2018-01-12 DIAGNOSIS — D123 Benign neoplasm of transverse colon: Secondary | ICD-10-CM | POA: Diagnosis not present

## 2018-01-12 HISTORY — PX: COLONOSCOPY: SHX5424

## 2018-01-12 HISTORY — PX: POLYPECTOMY: SHX5525

## 2018-01-12 LAB — CBC
HCT: 34.9 % — ABNORMAL LOW (ref 39.0–52.0)
Hemoglobin: 11.3 g/dL — ABNORMAL LOW (ref 13.0–17.0)
MCH: 30.1 pg (ref 26.0–34.0)
MCHC: 32.4 g/dL (ref 30.0–36.0)
MCV: 93.1 fL (ref 80.0–100.0)
Platelets: 169 10*3/uL (ref 150–400)
RBC: 3.75 MIL/uL — ABNORMAL LOW (ref 4.22–5.81)
RDW: 12.4 % (ref 11.5–15.5)
WBC: 6.2 10*3/uL (ref 4.0–10.5)
nRBC: 0 % (ref 0.0–0.2)

## 2018-01-12 LAB — MRSA PCR SCREENING: MRSA by PCR: POSITIVE — AB

## 2018-01-12 SURGERY — COLONOSCOPY
Anesthesia: Moderate Sedation

## 2018-01-12 MED ORDER — MUPIROCIN 2 % EX OINT
1.0000 "application " | TOPICAL_OINTMENT | Freq: Two times a day (BID) | CUTANEOUS | Status: DC
  Administered 2018-01-12 (×2): 1 via NASAL
  Filled 2018-01-12: qty 22

## 2018-01-12 MED ORDER — PANTOPRAZOLE SODIUM 40 MG PO TBEC
40.0000 mg | DELAYED_RELEASE_TABLET | Freq: Every day | ORAL | Status: DC
  Administered 2018-01-12: 40 mg via ORAL
  Filled 2018-01-12 (×2): qty 1

## 2018-01-12 MED ORDER — MEPERIDINE HCL 50 MG/ML IJ SOLN
INTRAMUSCULAR | Status: AC
  Filled 2018-01-12: qty 1

## 2018-01-12 MED ORDER — MIDAZOLAM HCL 5 MG/5ML IJ SOLN
INTRAMUSCULAR | Status: AC
  Filled 2018-01-12: qty 10

## 2018-01-12 MED ORDER — SODIUM CHLORIDE 0.9% FLUSH
INTRAVENOUS | Status: AC
  Filled 2018-01-12: qty 10

## 2018-01-12 MED ORDER — CHLORHEXIDINE GLUCONATE CLOTH 2 % EX PADS
6.0000 | MEDICATED_PAD | Freq: Every day | CUTANEOUS | Status: DC
  Administered 2018-01-13: 6 via TOPICAL

## 2018-01-12 MED ORDER — PEG 3350-KCL-NA BICARB-NACL 420 G PO SOLR
4000.0000 mL | Freq: Once | ORAL | Status: AC
  Administered 2018-01-12: 4000 mL via ORAL
  Filled 2018-01-12: qty 4000

## 2018-01-12 MED ORDER — FUROSEMIDE 20 MG PO TABS
20.0000 mg | ORAL_TABLET | Freq: Every day | ORAL | Status: DC
  Administered 2018-01-12: 20 mg via ORAL
  Filled 2018-01-12 (×2): qty 1

## 2018-01-12 MED ORDER — ONDANSETRON HCL 4 MG/2ML IJ SOLN
4.0000 mg | Freq: Three times a day (TID) | INTRAMUSCULAR | Status: DC | PRN
  Administered 2018-01-12: 4 mg via INTRAVENOUS
  Filled 2018-01-12: qty 2

## 2018-01-12 MED ORDER — STERILE WATER FOR IRRIGATION IR SOLN
Status: DC | PRN
  Administered 2018-01-12: 100 mL

## 2018-01-12 MED ORDER — ONDANSETRON HCL 4 MG PO TABS: 4.0000 mg | ORAL_TABLET | Freq: Three times a day (TID) | ORAL | Status: DC | PRN

## 2018-01-12 MED ORDER — MIDAZOLAM HCL 5 MG/5ML IJ SOLN
INTRAMUSCULAR | Status: DC | PRN
  Administered 2018-01-12 (×3): 1 mg via INTRAVENOUS

## 2018-01-12 MED ORDER — ALUM & MAG HYDROXIDE-SIMETH 200-200-20 MG/5ML PO SUSP
30.0000 mL | ORAL | Status: DC | PRN
  Administered 2018-01-12: 30 mL via ORAL
  Filled 2018-01-12: qty 30

## 2018-01-12 NOTE — Care Management Obs Status (Signed)
[place] NOTIFICATION   Patient Details  Name: SNEIJDER BERNARDS MRN: [id number] Date of Birth: 1926-10-06   Medicare Observation Status Notification Given:  Yes    Sherald Barge, RN 01/12/2018, 3:16 PM

## 2018-01-12 NOTE — Progress Notes (Signed)
PROGRESS NOTE  Brief Narrative: [name] is an active 84 y.o. 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 This started abruptly on the evening of 12/14 with fecal urgency and subsequent passing of dark red blood that has continued intermittently since admission. On admission this morning he was mildly anemic and hgb has trended downward. He denies symptoms attributable to anemia. GI was consulted and performed colonoscopy 12/16 showing extensive left colon diverticulosis, several polyps with large polyp at the hepatic flexure all snared, some requiring clips. There was no fresh blood or other stigmata of bleeding, though diverticular bleeding remains working diagnosis. He will continue off of anticoagulation for the time being.  Subjective: Still had some blood in his stool today. No dyspnea, chest pain, lightheadedness.  Objective: BP (!) 98/59   Pulse 60   Temp 97.6 F (36.4 C) (Oral)   Resp 20   Ht 5\' 8"  (1.727 m)   Wt 81.8 kg   SpO2 97%   BMI 27.42 kg/m   Gen: Pleasant, elderly male in no distress Pulm: Nonlabored breathing room air. Clear. CV: Regular rate and rhythm. No murmur, rub, or gallop. No JVD, no dependent edema. GI: Abdomen soft, non-tender, non-distended, with normoactive bowel sounds.  Ext: Warm, no deformities Skin: No rashes, lesions or ulcers on visualized skin. Marland Kitchen  Neuro: Alert and oriented. No focal neurological deficits. Psych: Judgement and insight appear fair. Mood euthymic & affect congruent. Behavior is appropriate.    Assessment & Plan: Active elderly male presented with GI bleeding in the setting of anticoagulation, consistent with diverticular bleed. Still having modest bleeding as of this afternoon, though none actively noted on colonoscopy. Hemoglobin is stable over past 24 hours, but down from baseline.  Discussed results with Dr. Laural Golden. - Continue to hold anticoagulation.  - Check blood counts in the morning. If stable and not showing any evidence of bleeding, would be eligible for discharge in the morning.  - Diet to be resumed per GI.   Chronic atrial fibrillation, sick sinus syndrome s/p PPM:  - Holding xarelto, rate controlled  HTN:  - Continue low dose norvasc, resume lasix.  GERD:  - Resume PPI  OSA: CPAP on med list - CPAP qHS  BPH:  - Continue alfuzosin, finasteride, no retention.    Patrecia Pour, MD 01/12/2018, 4:48 PM

## 2018-01-12 NOTE — Op Note (Signed)
[facility] Patient Name: [name] Procedure Date: 01/12/2018 3:07 PM MRN: [id number] Date of Birth: 1926/08/26 Attending MD: Hildred Laser , MD CSN: [id number] Age: 84 Admit Type: Outpatient Procedure:                Colonoscopy Indications:              Rectal bleeding Providers:                Hildred Laser, MD, Rosina Lowenstein, RN, Aram Candela Referring MD:             Vance Gather, MD Medicines:                Midazolam 3 mg IV Complications:            No immediate complications. Estimated Blood Loss:     Estimated blood loss was minimal. Procedure:                Pre-Anesthesia Assessment:                           - Prior to the procedure, a History and Physical                            was performed, and patient medications and                            allergies were reviewed. The patient's tolerance of                            previous anesthesia was also reviewed. The risks                            and benefits of the procedure and the sedation                            options and risks were discussed with the patient.                            All questions were answered, and informed consent                            was obtained. Prior Anticoagulants: The patient                            last took Xarelto (rivaroxaban) 2 days prior to the                            procedure. ASA Grade Assessment: III - A patient                            with severe systemic disease. After reviewing the                            risks and benefits, the patient was deemed in  satisfactory condition to undergo the procedure.                           After obtaining informed consent, the colonoscope                            was passed under direct vision. Throughout the                            procedure, the patient's blood pressure, pulse, and                            oxygen saturations were monitored continuously. The                  PCF-[id number] ([id number]) was introduced through the                            anus and advanced to the the cecum, identified by                            appendiceal orifice and ileocecal valve. The                            colonoscopy was performed without difficulty. The                            patient tolerated the procedure well. The quality                            of the bowel preparation was good. The ileocecal                            valve, appendiceal orifice, and rectum were                            photographed. Scope In: 3:55:59 PM Scope Out: 4:36:07 PM Scope Withdrawal Time: 0 hours 32 minutes 39 seconds  Total Procedure Duration: 0 hours 40 minutes 8 seconds  Findings:      The perianal and digital rectal examinations were normal.      Four sessile polyps were found in the transverse colon, hepatic flexure       and ascending colon. The polyps were 4 to 7 mm in size. These polyps       were removed with a cold snare. Resection and retrieval were complete.       To stop active bleeding, two hemostatic clips were successfully placed       (MR conditional). Bleeding had stopped at the end of the procedure.      A 20 mm polyp was found in the hepatic flexure. The polyp was       multi-lobulated. Area was successfully injected with Eleview for lesion       assessment, and this injection appeared to lift the lesion adequately.       The polyp was removed with a hot snare. Resection and retrieval were       complete. polyp reteived with roth  net. The pathology specimen was       placed into Bottle Number 2.      Scattered medium-mouthed diverticula were found in the descending colon.      Multiple small and large-mouthed diverticula were found in the sigmoid       colon.      Internal hemorrhoids were found during retroflexion. The hemorrhoids       were medium-sized. Impression:               - Four 4 to 7 mm polyps in the transverse colon, at                             the hepatic flexure and in the ascending colon,                            removed with a cold snare. Resected and retrieved.                            Clips (MR conditional) were placed.                           - One 20 mm polyp at the hepatic flexure, removed                            with a hot snare. Resected and retrieved. Injected.                           - Diverticulosis in the descending colon.                           - Diverticulosis in the sigmoid colon.                           - Internal hemorrhoids. Moderate Sedation:      Moderate (conscious) sedation was administered by the endoscopy nurse       and supervised by the endoscopist. The following parameters were       monitored: oxygen saturation, heart rate, blood pressure, CO2       capnography and response to care. Total physician intraservice time was       47 minutes. Recommendation:           - Return patient to hospital ward for ongoing care.                           - Cardiac diet today.                           - Continue present medications.                           - Resume Xarelto (rivaroxaban) at prior dose in 1                            week.                           -  Await pathology results.                           - Repeat colonoscopy is recommended. The                            colonoscopy date will be determined after pathology                            results from today's exam become available for                            review. Procedure Code(s):        --- Professional ---                           [phone], Colonoscopy, flexible; with removal of                            tumor(s), polyp(s), or other lesion(s) by snare                            technique                           45381, Colonoscopy, flexible; with directed                            submucosal injection(s), any substance                           99153, Moderate sedation; each additional 15                             minutes intraservice time                           99153, Moderate sedation; each additional 15                            minutes intraservice time                           G0500, Moderate sedation services provided by the                            same physician or other qualified health care                            professional performing a gastrointestinal                            endoscopic service that sedation supports,                            requiring the presence of an independent trained  observer to assist in the monitoring of the                            patient's level of consciousness and physiological                            status; initial 15 minutes of intra-service time;                            patient age 39 years or older (additional time may                            be reported with [phone], as appropriate) Diagnosis Code(s):        --- Professional ---                           D12.3, Benign neoplasm of transverse colon (hepatic                            flexure or splenic flexure)                           D12.2, Benign neoplasm of ascending colon                           K64.8, Other hemorrhoids                           K62.5, Hemorrhage of anus and rectum                           K57.30, Diverticulosis of large intestine without                            perforation or abscess without bleeding CPT copyright 2018 American [employer] Association. All rights reserved. The codes documented in this report are preliminary and upon coder review may  be revised to meet current compliance requirements. Hildred Laser, MD Hildred Laser, MD 01/12/2018 4:54:19 PM This report has been signed electronically. Number of Addenda: 0

## 2018-01-12 NOTE — Care Management Obs Status (Signed)
MEDICARE OBSERVATION STATUS NOTIFICATION   Patient Details  Name: [name] MRN: [id number] Date of Birth: May 29, 1926   Medicare Observation Status Notification Given:       Shelda Altes 01/12/2018, 10:41 AM

## 2018-01-12 NOTE — Consult Note (Signed)
Referring Provider: Vance Gather, MD  primary Care Physician:  Curlene Labrum, MD Primary Gastroenterologist:  Dr. Laural Golden  Reason for Consultation:   Rectal bleeding.  HPI:   Patient is 84 year old Caucasian male who was in usual state of health until around 70 PM on 01/10/2018 when he had an urge to have a bowel movement.  Instead he passed large amount of bright blood per rectum.  Few minutes later he had another bowel movement after which he came to emergency room.  He had another bowel movement while in emergency room.  Patient's hemoglobin was 13.7.  Platelet count was 160 6K and INR was 2.0.  Patient was deemed to be hemodynamically stable.  He was hospitalized for further management.  Said he had multiple bowel movements yesterday.  This morning he passed formed stool and there was some fresh blood as well.  Was small in amount. Xarelto and last dose was on Saturday evening or 2 days ago.  He denies abdominal pain nausea vomiting chest pain or shortness of breath.  He also has not experienced lightheadedness or postural symptoms.  His bowels usually move regularly.  Every now and then he may skip 2 days.  He has good appetite and has not lost any weight. Patient does not take aspirin or other OTC NSAIDs. Last colonoscopy was in July 2014 revealing pancolonic diverticulosis.  The study was done for cecal wall thickening on CT done for urologic reasons.  Patient retired over 30 years ago from [employer].  He used to live in [place] and moved to [place] in 1986. he is divorced.  He has 3 children from his first marriage twin sons and a daughter and they are all in good health.  He is on long-term relationship with Suanne Marker who is at bedside.  Been together for 28 years but not married. He smokes cigarettes for about 14 years and quit in 1956.  He smoked about a pack a day.  He used to drink alcohol daily while he was working but since then he has had few drinks but none in the  last 5 years.  Family history significant for CAD in both parents.  Mother was in her 1s when she had MI and died and father had MI at age 55.  One brother died of lung carcinoma in his 44s.  One sister died of ovarian cancer at age 65 and another sister died of stomach cancer in her 47s.    Past Medical History:  Diagnosis Date  . AAA (abdominal aortic aneurysm) ([place])   . Atrial fibrillation ([facility])    bicarbonate perfusion study 10/11 EF 69%. small defects no ischemia. 7 metastases acheived. ER visit 11/10/09 ruled out MI normal sinus rhythm orthostasis after sublingual nitroglycerin   . BPH (benign prostatic hyperplasia)   . Chronic back pain   . GERD (gastroesophageal reflux disease)   . Hypertension   . Orthostatic hypotension   . PONV (postoperative nausea and vomiting)   . Sleep apnea    CPAP at night  . Stress fracture of foot    left  . Swelling of both lower extremities     Past Surgical History:  Procedure Laterality Date  . CHOLECYSTECTOMY  2012   [facility] by Dr. Geroge Baseman  . COLONOSCOPY N/A 08/27/2012   Procedure: COLONOSCOPY;  Surgeon: Rogene Houston, MD;  Location: [facility];  Service: Endoscopy;  Laterality: N/A;  325  . HERNIA REPAIR Bilateral    Inguinal  and umbilical  . INCISIONAL HERNIA REPAIR N/A 01/31/2014   Procedure: Fatima Blank HERNIORRHAPHY WITH MESH;  Surgeon: Jamesetta So, MD;  Location: [facility] ORS;  Service: General;  Laterality: N/A;  . INSERTION OF MESH N/A 01/31/2014   Procedure: INSERTION OF MESH;  Surgeon: Jamesetta So, MD;  Location: [facility] ORS;  Service: General;  Laterality: N/A;  . PACEMAKER IMPLANT N/A 12/02/2017   Procedure: PACEMAKER IMPLANT;  Surgeon: [name], MD;  Location: [facility];  Service: Cardiovascular;  Laterality: N/A;  . SHOULDER SURGERY Right 2005?   for open rotator cuff repair. [facility]  . WOUND DEBRIDEMENT Right 01/31/2014   Procedure: DEBRIDEMENT WOUND RIGHT LEG;  Surgeon: Jamesetta So, MD;   Location: [facility];  Service: General;  Laterality: Right;  . WRIST FRACTURE SURGERY Right     Prior to Admission medications   Medication Sig Start Date End Date Taking? Authorizing Provider  alfuzosin (UROXATRAL) 10 MG 24 hr tablet Take 10 mg by mouth daily with breakfast.    [provider]  amLODipine (NORVASC) 2.5 MG tablet Take 1 tablet (2.5 mg total) by mouth daily. 12/18/16 12/15/17  Allred, Jeneen Rinks, MD  CALCIUM-MAGNESIUM-ZINC PO Take 1 tablet by mouth daily.    [provider]  Cholecalciferol (VITAMIN D) 400 UNITS capsule Take 400 Units by mouth daily.     [provider]  dorzolamide (TRUSOPT) 2 % ophthalmic solution Place 1 drop into the right eye 2 (two) times daily.     [provider]  esomeprazole (NEXIUM) 20 MG capsule Take 20 mg by mouth daily.    [provider]  finasteride (PROSCAR) 5 MG tablet Take 5 mg by mouth daily.      [provider]  furosemide (LASIX) 20 MG tablet Take 20 mg by mouth daily.     [provider]  latanoprost (XALATAN) 0.005 % ophthalmic solution Place 1 drop into both eyes at bedtime.     [provider]  NON FORMULARY Apply 1 application topically daily as needed (pain). CBD Foot Cream    [provider]  Omega-3 Fatty Acids (FISH OIL) 1200 MG CAPS Take 1,200 mg by mouth daily.     [provider]  rivaroxaban (XARELTO) 20 MG TABS tablet Take 1 tablet (20 mg total) by mouth daily with supper. Resume on 12/05/17 12/03/17   Patsey Berthold, NP  UNABLE TO FIND at bedtime. On CPAP    [provider]    Current Facility-Administered Medications  Medication Dose Route Frequency Provider Last Rate Last Dose  . 0.9 %  sodium chloride infusion   Intravenous Continuous Oswald Hillock, MD 10 mL/hr at 01/11/18 0434    . alfuzosin (UROXATRAL) 24 hr tablet 10 mg  10 mg Oral Q breakfast Oswald Hillock, MD   10 mg at 01/12/18 0841  . amLODipine (NORVASC) tablet 2.5 mg  2.5  mg Oral Daily Darrick Meigs, Gagan S, MD   2.5 mg at 01/12/18 0900  . dorzolamide (TRUSOPT) 2 % ophthalmic solution 1 drop  1 drop Right Eye BID Oswald Hillock, MD   1 drop at 01/12/18 0900  . finasteride (PROSCAR) tablet 5 mg  5 mg Oral Daily Oswald Hillock, MD   5 mg at 01/12/18 0900  . latanoprost (XALATAN) 0.005 % ophthalmic solution 1 drop  1 drop Both Eyes QHS Oswald Hillock, MD   1 drop at 01/11/18 2240  . polyethylene glycol-electrolytes (NuLYTELY/GoLYTELY) solution 4,000  mL  4,000 mL Oral Once Rogene Houston, MD        Allergies as of 01/11/2018 - Review Complete 01/11/2018  Allergen Reaction Noted  . Flexeril [cyclobenzaprine hcl] Nausea And Vomiting 08/16/2010  . Percocet [oxycodone-acetaminophen] Nausea And Vomiting 08/16/2010  . Vicodin [hydrocodone-acetaminophen] Nausea And Vomiting 08/16/2010  . Sulfonamide derivatives Rash     Family History  Problem Relation Age of Onset  . Heart failure Mother   . Heart failure Father   . Cancer Sister     Social History   Socioeconomic History  . Marital status: Divorced    Spouse name: Not on file  . Number of children: Not on file  . Years of education: Not on file  . Highest education level: Not on file  Occupational History  . Not on file  Social Needs  . Financial resource strain: Not on file  . Food insecurity:    Worry: Not on file    Inability: Not on file  . Transportation needs:    Medical: Not on file    Non-medical: Not on file  Tobacco Use  . Smoking status: Former Smoker    Packs/day: 1.00    Years: 10.00    Pack years: 10.00    Types: Cigarettes    Start date: 10/15/1943    Last attempt to quit: 01/28/1954    Years since quitting: 64.0  . Smokeless tobacco: Never Used  . Tobacco comment: tobacco use - no  Substance and Sexual Activity  . Alcohol use: No    Alcohol/week: 0.0 standard drinks  . Drug use: No  . Sexual activity: Not on file  Lifestyle  . Physical activity:    Days per week: Not on file     Minutes per session: Not on file  . Stress: Not on file  Relationships  . Social connections:    Talks on phone: Not on file    Gets together: Not on file    Attends religious service: Not on file    Active member of club or organization: Not on file    Attends meetings of clubs or organizations: Not on file    Relationship status: Not on file  . Intimate partner violence:    Fear of current or ex partner: Not on file    Emotionally abused: Not on file    Physically abused: Not on file    Forced sexual activity: Not on file  Other Topics Concern  . Not on file  Social History Narrative  . Not on file    Review of Systems: See HPI, otherwise normal ROS  Physical Exam: Temp:  [97.6 F (36.4 C)-98.4 F (36.9 C)] 97.6 F (36.4 C) (12/16 0522) Pulse Rate:  [63-75] 63 (12/16 0522) Resp:  [18] 18 (12/16 0522) BP: (115-142)/(77-87) 129/83 (12/16 0522) SpO2:  [92 %-94 %] 92 % (12/16 0921) Last BM Date: 01/11/18  Patient is alert and in no acute distress Conjunctiva is pink and sclerae nonicteric. Oropharyngeal mucosa is normal.  He has upper and lower dentures in place No neck masses or thyromegaly noted. Cardiac exam with regular rhythm normal S1 and S2.  No murmur or gallop noted. Auscultation lungs reveal vesicular breath sounds bilaterally. Abdomen is symmetrical.  He has bilateral inguinal scars.  Sounds are normal.  No bruit noted.  On palpation it is soft and nontender with organomegaly or masses. Extremities without edema or clubbing.   Lab Results: Recent Labs    01/11/18 0103 01/11/18  7322 01/11/18 1639 01/12/18 0547  WBC 6.0 6.0  --  6.2  HGB 12.4* 11.2* 11.4* 11.3*  HCT 38.5* 35.3* 35.4* 34.9*  PLT 166 150  --  169   BMET Recent Labs    01/11/18 0103 01/11/18 0702  NA 138 140  K 3.8 3.9  CL 107 109  CO2 24 24  GLUCOSE 125* 113*  BUN 12 12  CREATININE 0.92 0.89  CALCIUM 9.1 9.1   LFT Recent Labs    01/11/18 0702  PROT 6.1*  ALBUMIN 3.3*   AST 15  ALT 9  ALKPHOS 48  BILITOT 0.9   PT/INR Recent Labs    01/11/18 0103  LABPROT 22.5*  INR 2.00    Assessment;  Patient is 84 year old Caucasian male in very good health who presents with large volume painless hematochezia in the setting of chronic anticoagulation for atrial fibrillation.  He is on Riveroxaban and last dose was the evening of 01/10/2018. Since hemoglobin has dropped by more than 2 g.  It appears bleeding has either stopped or significantly slowed down. Last colonoscopy was in July 2014 revealing pancolonic diverticulosis. Family history significant for various malignancies and 3 siblings but none with colon carcinoma. Suspect we are dealing with colonic diverticular bleed.  It appears patient will need to go back on anticoagulant for atrial fibrillation. Options reviewed with patient and his longtime friend Suanne Marker and they are both agreeable to proceed with colonoscopy.   Recommendations;  Colonoscopy with therapeutic intention later today. Patient risks reviewed with the patient and is agreeable.   LOS: 0 days   Haruko Mersch  01/12/2018, 10:59 AM

## 2018-01-12 NOTE — Progress Notes (Signed)
Brief colonoscopy note.  Prep satisfactory. Notable diverticula at sigmoid colon without stigmata of bleed. Diverticula at descending colon. 4 small polyps cold snared and 2 clips used. 20 mm polyp snared from hepatic flexure after elevation with [place]. Internal hemorrhoids.

## 2018-01-13 DIAGNOSIS — K219 Gastro-esophageal reflux disease without esophagitis: Secondary | ICD-10-CM | POA: Diagnosis not present

## 2018-01-13 DIAGNOSIS — D123 Benign neoplasm of transverse colon: Secondary | ICD-10-CM | POA: Diagnosis not present

## 2018-01-13 DIAGNOSIS — K5730 Diverticulosis of large intestine without perforation or abscess without bleeding: Secondary | ICD-10-CM | POA: Diagnosis not present

## 2018-01-13 DIAGNOSIS — I480 Paroxysmal atrial fibrillation: Secondary | ICD-10-CM | POA: Diagnosis not present

## 2018-01-13 DIAGNOSIS — I714 Abdominal aortic aneurysm, without rupture: Secondary | ICD-10-CM | POA: Diagnosis not present

## 2018-01-13 DIAGNOSIS — I10 Essential (primary) hypertension: Secondary | ICD-10-CM | POA: Diagnosis not present

## 2018-01-13 DIAGNOSIS — K648 Other hemorrhoids: Secondary | ICD-10-CM | POA: Diagnosis not present

## 2018-01-13 DIAGNOSIS — I495 Sick sinus syndrome: Secondary | ICD-10-CM | POA: Diagnosis not present

## 2018-01-13 DIAGNOSIS — K625 Hemorrhage of anus and rectum: Secondary | ICD-10-CM | POA: Diagnosis not present

## 2018-01-13 DIAGNOSIS — D122 Benign neoplasm of ascending colon: Secondary | ICD-10-CM | POA: Diagnosis not present

## 2018-01-13 LAB — CBC
HEMATOCRIT: 33.2 % — AB (ref 39.0–52.0)
Hemoglobin: 10.7 g/dL — ABNORMAL LOW (ref 13.0–17.0)
MCH: 30.4 pg (ref 26.0–34.0)
MCHC: 32.2 g/dL (ref 30.0–36.0)
MCV: 94.3 fL (ref 80.0–100.0)
PLATELETS: 153 10*3/uL (ref 150–400)
RBC: 3.52 MIL/uL — ABNORMAL LOW (ref 4.22–5.81)
RDW: 12.2 % (ref 11.5–15.5)
WBC: 9.3 10*3/uL (ref 4.0–10.5)
nRBC: 0 % (ref 0.0–0.2)

## 2018-01-13 MED ORDER — RIVAROXABAN 20 MG PO TABS: 20.0000 mg | ORAL_TABLET | Freq: Every day | ORAL | 6 refills | Status: DC

## 2018-01-13 NOTE — Discharge Instructions (Signed)
Gastrointestinal Bleeding °Gastrointestinal bleeding is bleeding somewhere along the path food travels through the body (digestive tract). This path is anywhere between the mouth and the opening of the butt (anus). You may have blood in your poop (stools) or have black poop. If you throw up (vomit), there may be blood in it. °This condition can be mild, serious, or even life-threatening. If you have a lot of bleeding, you may need to stay in the hospital. °Follow these instructions at home: °· Take over-the-counter and prescription medicines only as told by your doctor. °· Eat foods that have a lot of fiber in them. These foods include whole grains, fruits, and vegetables. You can also try eating 1-3 prunes each day. °· Drink enough fluid to keep your pee (urine) clear or pale yellow. °· Keep all follow-up visits as told by your doctor. This is important. °Contact a doctor if: °· Your symptoms do not get better. °Get help right away if: °· Your bleeding gets worse. °· You feel dizzy or you pass out (faint). °· You feel weak. °· You have very bad cramps in your back or belly (abdomen). °· You pass large clumps of blood (clots) in your poop. °· Your symptoms are getting worse. °This information is not intended to replace advice given to you by your health care provider. Make sure you discuss any questions you have with your health care provider. °Document Released: 10/24/2007 Document Revised: 06/22/2015 Document Reviewed: 07/04/2014 °Elsevier Interactive Patient Education © 2018 [employer] ° °

## 2018-01-13 NOTE — Progress Notes (Signed)
Discharge instructions reviewed with patient, wife at bedside. Given AVS. Verbalized understanding of instructions, when to follow-up and to resume Xarelto on 01/20/18 as directed by Dr. Laural Golden. Verbalized understanding of GI bleeding symptoms and to seek medical attention if symptoms return. Pt in stable condition for discharge home. Left floor in stable condition via w/c accompanied by nurse tech. Donavan Foil, RN

## 2018-01-13 NOTE — Progress Notes (Signed)
  Subjective:  Patient has no complaints.  He states he has bowel movement last time when he passed small amount of stool.  He denies abdominal pain nausea or vomiting.  Objective: Blood pressure 107/71, pulse 64, temperature 98.1 F (36.7 C), temperature source Oral, resp. rate 18, height '5\' 8"'$  (1.727 m), weight 81.8 kg, SpO2 93 %. Patient is alert and in no acute distress. Abdomen is symmetrical.  Bowel sounds are normal.  On palpation is soft and nontender with organomegaly or masses.  Labs/studies Results:  CBC Latest Ref Rng & Units 01/13/2018 01/12/2018 01/11/2018  WBC 4.0 - 10.5 K/uL 9.3 6.2 -  Hemoglobin 13.0 - 17.0 g/dL 10.7(L) 11.3(L) 11.4(L)  Hematocrit 39.0 - 52.0 % 33.2(L) 34.9(L) 35.4(L)  Platelets 150 - 400 K/uL 153 169 -    CMP Latest Ref Rng & Units 01/11/2018 01/11/2018 12/03/2017  Glucose 70 - 99 mg/dL 113(H) 125(H) 99  BUN 8 - 23 mg/dL '12 12 11  '$ Creatinine 0.61 - 1.24 mg/dL 0.89 0.92 0.96  Sodium 135 - 145 mmol/L 140 138 139  Potassium 3.5 - 5.1 mmol/L 3.9 3.8 3.8  Chloride 98 - 111 mmol/L 109 107 108  CO2 22 - 32 mmol/L '24 24 25  '$ Calcium 8.9 - 10.3 mg/dL 9.1 9.1 9.0  Total Protein 6.5 - 8.1 g/dL 6.1(L) 6.6 -  Total Bilirubin 0.3 - 1.2 mg/dL 0.9 0.8 -  Alkaline Phos 38 - 126 U/L 48 49 -  AST 15 - 41 U/L 15 17 -  ALT 0 - 44 U/L 9 12 -    Hepatic Function Latest Ref Rng & Units 01/11/2018 01/11/2018 11/18/2014  Total Protein 6.5 - 8.1 g/dL 6.1(L) 6.6 5.9(L)  Albumin 3.5 - 5.0 g/dL 3.3(L) 3.5 2.8(L)  AST 15 - 41 U/L '15 17 20  '$ ALT 0 - 44 U/L 9 12 14(L)  Alk Phosphatase 38 - 126 U/L 48 49 59  Total Bilirubin 0.3 - 1.2 mg/dL 0.9 0.8 1.1  Bilirubin, Direct 0.1 - 0.5 mg/dL - - -      Assessment:  #1.  Lower GI bleed most likely secondary to colonic diverticulosis and less likely due to large polyp at hepatic flexure removed at colonoscopy yesterday.  Patient had 4 small polyps removed as well.  #2.  Anemia secondary to GI bleed.  His hemoglobin has dropped  by 3 g since admission.  He has not blood in the last 24 hours.  #3.  Anticoagulation on hold.  Recommendations:  Patient stable for discharge this morning. Resume Xarelto on 01/20/2018. I will contact the patient with biopsy results and further recommendations.

## 2018-01-13 NOTE — Discharge Summary (Signed)
Physician Discharge Summary  [name] FAO:[id number] DOB: December 28, 1926 DOA: 01/11/2018  PCP: Curlene Labrum, MD  Admit date: 01/11/2018 Discharge date: 01/13/2018  Admitted From: Home Disposition: Home   Recommendations for Outpatient Follow-up:  1. Follow up with PCP in 1-2 weeks 2. Please obtain BMP/CBC in one week 3. Restart xarelto on 01/20/2018 per GI recommendations  Home Health: None Equipment/Devices: None Discharge Condition: Stable CODE STATUS: Full Diet recommendation: Heart healthy  Brief/Interim Summary: [name] is an active 84 y.o. 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 This started abruptly on the evening of 12/14 with fecal urgency and subsequent passing of dark red blood that has continued intermittently since admission. On admission this morning he was mildly anemic and hgb has trended downward. He denies symptoms attributable to anemia. GI was consulted and performed colonoscopy 12/16 showing extensive left colon diverticulosis, several polyps with large polyp at the hepatic flexure all snared, some requiring clips. There was no fresh blood or other stigmata of bleeding, though diverticular bleeding remains working diagnosis. He will continue off of anticoagulation through 01/20/2018 per GI recommendations. He is stable for discharge with hgb 10.7g/dl and no recent bleeding noted.  Discharge Diagnoses:  Principal Problem:   Rectal bleeding Active Problems:   ATRIAL FIBRILLATION   Sick sinus syndrome (HCC)   BPH (benign prostatic hyperplasia)   AAA (abdominal aortic aneurysm) (HCC)   Hypertension   GERD (gastroesophageal reflux disease)  Acute blood loss anemia: Very mild.  - Monitor CBC at follow up.   Diverticular bleed: Working diagnosis based on colonoscopy results by Dr. Laural Golden. Seems to  have stabilized, no recent bleeding at time of discharge. - Continue to hold anticoagulation.   Chronic atrial fibrillation, sick sinus syndrome s/p PPM:  - Holding xarelto, rate controlled  HTN:  - Continue low dose norvasc, resume lasix.  GERD:  - Resume PPI  OSA: CPAP on med list - CPAP qHS  BPH:  - Continue alfuzosin, finasteride, no retention.  Discharge Instructions Discharge Instructions    Discharge instructions   Complete by:  As directed    Your blood counts are stable and you are not having continued bleeding at this time. There was no evidence of bleeding on colonoscopy on 12/16. You are stable for discharge with the follow recommendations:  - Do not restart xarelto until 01/20/2018. Dr. Laural Golden will follow up with you regarding results from the colonoscopy and further recommendations.  - If your symptoms worsen, seek medical attention right away.   Increase activity slowly   Complete by:  As directed      Allergies as of 01/13/2018      Reactions   Flexeril [cyclobenzaprine Hcl] Nausea And Vomiting   Percocet [oxycodone-acetaminophen] Nausea And Vomiting   Vicodin [hydrocodone-acetaminophen] Nausea And Vomiting   Sulfonamide Derivatives Rash      Medication List    TAKE these medications   alfuzosin 10 MG 24 hr tablet Commonly known as:  UROXATRAL Take 10 mg by mouth daily with breakfast.   amLODipine 2.5 MG tablet Commonly known as:  NORVASC Take 1 tablet (2.5 mg total) by mouth daily.   CALCIUM-MAGNESIUM-ZINC PO Take 1 tablet by mouth daily.   dorzolamide 2 % ophthalmic solution Commonly known as:  TRUSOPT Place 1 drop into the right eye 2 (two) times daily.   esomeprazole 20  MG capsule Commonly known as:  NEXIUM Take 20 mg by mouth daily.   finasteride 5 MG tablet Commonly known as:  PROSCAR Take 5 mg by mouth daily.   Fish Oil 1200 MG Caps Take 1,200 mg by mouth daily.   furosemide 20 MG tablet Commonly known as:  LASIX Take 20  mg by mouth daily.   latanoprost 0.005 % ophthalmic solution Commonly known as:  XALATAN Place 1 drop into both eyes at bedtime.   NON FORMULARY Apply 1 application topically daily as needed (pain). CBD Foot Cream   rivaroxaban 20 MG Tabs tablet Commonly known as:  XARELTO Take 1 tablet (20 mg total) by mouth daily with supper. Resume on 01/20/2018 What changed:  additional instructions   UNABLE TO FIND at bedtime. On CPAP   Vitamin D 400 units capsule Take 400 Units by mouth daily.      Follow-up Information    Burdine, Virgina Evener, MD. Schedule an appointment as soon as possible for a visit.   Specialty:  Family Medicine Why:  Make appointment for followup in the next 1-2 weeks as directed.  Contact information: [address] [place] [address] [phone]        Herminio Commons, MD .   Specialty:  Cardiology Contact information: [place] [address] [phone]        Rogene Houston, MD Follow up.   Specialty:  Gastroenterology Why:  Followup as directed.  Contact information: [address], [address] [place] [address] [phone]          Allergies  Allergen Reactions  . Flexeril [Cyclobenzaprine Hcl] Nausea And Vomiting  . Percocet [Oxycodone-Acetaminophen] Nausea And Vomiting  . Vicodin [Hydrocodone-Acetaminophen] Nausea And Vomiting  . Sulfonamide Derivatives Rash    Consultations:  GI, Dr. Laural Golden  Procedures/Studies: Colonoscopy 01/12/2018: Findings:      The perianal and digital rectal examinations were normal.      Four sessile polyps were found in the transverse colon, hepatic flexure       and ascending colon. The polyps were 4 to 7 mm in size. These polyps       were removed with a cold snare. Resection and retrieval were complete.       To stop active bleeding, two hemostatic clips were successfully placed       (MR conditional). Bleeding had stopped at the end of the procedure.      A 20 mm polyp was  found in the hepatic flexure. The polyp was       multi-lobulated. Area was successfully injected with [place] for lesion       assessment, and this injection appeared to lift the lesion adequately.       The polyp was removed with a hot snare. Resection and retrieval were       complete. polyp reteived with roth net. The pathology specimen was       placed into Bottle Number 2.      Scattered medium-mouthed diverticula were found in the descending colon.      Multiple small and large-mouthed diverticula were found in the sigmoid       colon.      Internal hemorrhoids were found during retroflexion. The hemorrhoids       were medium-sized. Impression:       - Four 4 to 7 mm polyps in the transverse colon, at  the hepatic flexure and in the ascending colon,                            removed with a cold snare. Resected and retrieved.                            Clips (MR conditional) were placed.                           - One 20 mm polyp at the hepatic flexure, removed                            with a hot snare. Resected and retrieved. Injected.                           - Diverticulosis in the descending colon.                           - Diverticulosis in the sigmoid colon.                           - Internal hemorrhoids.  Recommendation: Return patient to hospital ward for ongoing care.                           - Cardiac diet today.                           - Continue present medications.                           - Resume Xarelto (rivaroxaban) at prior dose in 1                            week.                           - Await pathology results.                           - Repeat colonoscopy is recommended. The                            colonoscopy date will be determined after pathology                            results from today's exam become available for                            review.  Subjective: Feels well, no further bleeding today.  Wants to go home.  Discharge Exam: Vitals:   01/12/18 2255 01/13/18 0542  BP:  107/71  Pulse:  64  Resp:  18  Temp:    SpO2: 94% 93%   General: Pt is alert, awake, not in acute distress Cardiovascular: No murmur or gallop or JVD. Respiratory: CTA bilaterally, no wheezing, no rhonchi Abdominal: Soft,  NT, ND, bowel sounds + Extremities: No edema, no cyanosis  Labs: BNP (last 3 results) No results for input(s): BNP in the last 8760 hours. Basic Metabolic Panel: Recent Labs  Lab 01/11/18 0103 01/11/18 0702  NA 138 140  K 3.8 3.9  CL 107 109  CO2 24 24  GLUCOSE 125* 113*  BUN 12 12  CREATININE 0.92 0.89  CALCIUM 9.1 9.1   Liver Function Tests: Recent Labs  Lab 01/11/18 0103 01/11/18 0702  AST 17 15  ALT 12 9  ALKPHOS 49 48  BILITOT 0.8 0.9  PROT 6.6 6.1*  ALBUMIN 3.5 3.3*   No results for input(s): LIPASE, AMYLASE in the last 168 hours. No results for input(s): AMMONIA in the last 168 hours. CBC: Recent Labs  Lab 01/11/18 0103 01/11/18 0702 01/11/18 1639 01/12/18 0547 01/13/18 0504  WBC 6.0 6.0  --  6.2 9.3  NEUTROABS 2.9  --   --   --   --   HGB 12.4* 11.2* 11.4* 11.3* 10.7*  HCT 38.5* 35.3* 35.4* 34.9* 33.2*  MCV 93.7 94.4  --  93.1 94.3  PLT 166 150  --  169 153   Cardiac Enzymes: No results for input(s): CKTOTAL, CKMB, CKMBINDEX, TROPONINI in the last 168 hours. BNP: Invalid input(s): POCBNP CBG: No results for input(s): GLUCAP in the last 168 hours. D-Dimer No results for input(s): DDIMER in the last 72 hours. Hgb A1c No results for input(s): HGBA1C in the last 72 hours. Lipid Profile No results for input(s): CHOL, HDL, LDLCALC, TRIG, CHOLHDL, LDLDIRECT in the last 72 hours. Thyroid function studies No results for input(s): TSH, T4TOTAL, T3FREE, THYROIDAB in the last 72 hours.  Invalid input(s): FREET3 Anemia work up No results for input(s): VITAMINB12, FOLATE, FERRITIN, TIBC, IRON, RETICCTPCT in the last 72 hours. Urinalysis     Component Value Date/Time   COLORURINE YELLOW 11/17/2014 [place] 11/17/2014 1634   LABSPEC 1.010 11/17/2014 1634   PHURINE 5.5 11/17/2014 [place] 11/17/2014 1634   HGBUR MODERATE (A) 11/17/2014 [place] 11/17/2014 [place] 11/17/2014 1634   PROTEINUR NEGATIVE 11/17/2014 1634   UROBILINOGEN 0.2 11/17/2014 1634   NITRITE NEGATIVE 11/17/2014 [place] 11/17/2014 1634    Microbiology Recent Results (from the past 240 hour(s))  MRSA PCR Screening     Status: Abnormal   Collection Time: 01/12/18  9:38 AM  Result Value Ref Range Status   MRSA by PCR POSITIVE (A) NEGATIVE Final    Comment:        The GeneXpert MRSA Assay (FDA approved for NASAL specimens only), is one component of a comprehensive MRSA colonization surveillance program. It is not intended to diagnose MRSA infection nor to guide or monitor treatment for MRSA infections. RESULT CALLED TO, READ BACK BY AND VERIFIED WITH: DISHMON M. AT 1215P ON 197588 BY THOMPSON S. Performed at [facility], [address] [place], [place] [address]     Time coordinating discharge: Approximately 40 minutes  Patrecia Pour, MD  [facility] 01/13/2018, 3:04 PM Pager [phone]

## 2018-01-13 NOTE — Progress Notes (Signed)
Patient stated "I'll just take my medicines when I get home." noted when home medications due on AVS. Verbalized understanding. Donavan Foil, RN

## 2018-01-14 ENCOUNTER — Other Ambulatory Visit: Payer: Self-pay | Admitting: Internal Medicine

## 2018-01-16 ENCOUNTER — Encounter (HOSPITAL_COMMUNITY): Payer: Self-pay | Admitting: Internal Medicine

## 2018-01-20 DIAGNOSIS — I495 Sick sinus syndrome: Secondary | ICD-10-CM | POA: Diagnosis not present

## 2018-01-20 DIAGNOSIS — Z950 Presence of cardiac pacemaker: Secondary | ICD-10-CM | POA: Diagnosis not present

## 2018-01-20 DIAGNOSIS — I10 Essential (primary) hypertension: Secondary | ICD-10-CM | POA: Diagnosis not present

## 2018-01-20 DIAGNOSIS — I5032 Chronic diastolic (congestive) heart failure: Secondary | ICD-10-CM | POA: Diagnosis not present

## 2018-01-20 DIAGNOSIS — I714 Abdominal aortic aneurysm, without rupture: Secondary | ICD-10-CM | POA: Diagnosis not present

## 2018-01-20 DIAGNOSIS — Z6827 Body mass index (BMI) 27.0-27.9, adult: Secondary | ICD-10-CM | POA: Diagnosis not present

## 2018-01-20 DIAGNOSIS — D126 Benign neoplasm of colon, unspecified: Secondary | ICD-10-CM | POA: Diagnosis not present

## 2018-01-20 DIAGNOSIS — K625 Hemorrhage of anus and rectum: Secondary | ICD-10-CM | POA: Diagnosis not present

## 2018-01-20 DIAGNOSIS — I480 Paroxysmal atrial fibrillation: Secondary | ICD-10-CM | POA: Diagnosis not present

## 2018-01-20 DIAGNOSIS — D369 Benign neoplasm, unspecified site: Secondary | ICD-10-CM | POA: Diagnosis not present

## 2018-01-20 DIAGNOSIS — G4733 Obstructive sleep apnea (adult) (pediatric): Secondary | ICD-10-CM | POA: Diagnosis not present

## 2018-01-20 DIAGNOSIS — E782 Mixed hyperlipidemia: Secondary | ICD-10-CM | POA: Diagnosis not present

## 2018-02-05 ENCOUNTER — Ambulatory Visit (INDEPENDENT_AMBULATORY_CARE_PROVIDER_SITE_OTHER): Payer: Medicare Other | Admitting: Pharmacist

## 2018-02-05 DIAGNOSIS — K625 Hemorrhage of anus and rectum: Secondary | ICD-10-CM | POA: Diagnosis not present

## 2018-02-05 DIAGNOSIS — I480 Paroxysmal atrial fibrillation: Secondary | ICD-10-CM

## 2018-02-05 DIAGNOSIS — C44321 Squamous cell carcinoma of skin of nose: Secondary | ICD-10-CM | POA: Diagnosis not present

## 2018-02-05 DIAGNOSIS — L820 Inflamed seborrheic keratosis: Secondary | ICD-10-CM | POA: Diagnosis not present

## 2018-02-05 DIAGNOSIS — D485 Neoplasm of uncertain behavior of skin: Secondary | ICD-10-CM | POA: Diagnosis not present

## 2018-02-05 DIAGNOSIS — L570 Actinic keratosis: Secondary | ICD-10-CM | POA: Diagnosis not present

## 2018-02-05 DIAGNOSIS — C44329 Squamous cell carcinoma of skin of other parts of face: Secondary | ICD-10-CM | POA: Diagnosis not present

## 2018-02-05 DIAGNOSIS — L814 Other melanin hyperpigmentation: Secondary | ICD-10-CM | POA: Diagnosis not present

## 2018-02-05 DIAGNOSIS — C44319 Basal cell carcinoma of skin of other parts of face: Secondary | ICD-10-CM | POA: Diagnosis not present

## 2018-02-05 NOTE — Progress Notes (Signed)
Pt was started on Xarelto 20mg  for afib in Nov 2018.  He was recently admitted for a GI bleed, HgB on discharge was 10.7.  We had a lengthy discussion about need to monitor stools and other signs/ symptoms of bleeding.   Reviewed patients medication list.  Pt is not currently on any combined P-gp and strong CYP3A4 inhibitors/inducers (ketoconazole, traconazole, ritonavir, carbamazepine, phenytoin, rifampin, St. John's wort).  Reviewed labs.  SCr 0.89, Weight 81.8kg, CrCl- 56ml/min.  Dose appropriate based on CrCl.   Hgb 11.5 and stable.   A full discussion of the nature of anticoagulants has been carried out.  A benefit/risk analysis has been presented to the patient, so that they understand the justification for choosing anticoagulation with Xarelto at this time.  The need for compliance is stressed.  Pt is aware to take the medication once daily with the largest meal of the day.  Side effects of potential bleeding are discussed, including unusual colored urine or stools, coughing up blood or coffee ground emesis, nose bleeds or serious fall or head trauma.  Discussed signs and symptoms of stroke. The patient should avoid any OTC items containing aspirin or ibuprofen.  Avoid alcohol consumption.   Call if any signs of abnormal bleeding.  Discussed financial obligations and resolved any difficulty in obtaining medication.   02/05/18 - pt aware of results.

## 2018-02-05 NOTE — Patient Instructions (Signed)
A full discussion of the nature of anticoagulants has been carried out.  A benefit/risk analysis has been presented to the patient, so that they understand the justification for choosing anticoagulation with Xarelto at this time.  The need for compliance is stressed.  Pt is aware to take the medication once daily with the largest meal of the day.  Side effects of potential bleeding are discussed, including unusual colored urine or stools, coughing up blood or coffee ground emesis, nose bleeds or serious fall or head trauma.  Discussed signs and symptoms of stroke. The patient should avoid any OTC items containing aspirin or ibuprofen.  Avoid alcohol consumption.   Call if any signs of abnormal bleeding.  Discussed financial obligations and resolved any difficulty in obtaining medication.

## 2018-02-11 ENCOUNTER — Telehealth: Payer: Self-pay | Admitting: *Deleted

## 2018-02-11 NOTE — Telephone Encounter (Signed)
Notes recorded by Laurine Blazer, LPN on 07/12/1832 at 3:73 PM EST Patient notified. Copy to pmd. Follow up scheduled for 02/13/2018 with Dr. Bronson Ing.   ------  Notes recorded by Herminio Commons, MD on 02/05/2018 at 4:52 PM EST Normal renal function. Mildly anemic. Good results overall.

## 2018-02-12 DIAGNOSIS — I495 Sick sinus syndrome: Secondary | ICD-10-CM | POA: Diagnosis not present

## 2018-02-12 DIAGNOSIS — I714 Abdominal aortic aneurysm, without rupture: Secondary | ICD-10-CM | POA: Diagnosis not present

## 2018-02-12 DIAGNOSIS — K625 Hemorrhage of anus and rectum: Secondary | ICD-10-CM | POA: Diagnosis not present

## 2018-02-12 DIAGNOSIS — I480 Paroxysmal atrial fibrillation: Secondary | ICD-10-CM | POA: Diagnosis not present

## 2018-02-12 DIAGNOSIS — D126 Benign neoplasm of colon, unspecified: Secondary | ICD-10-CM | POA: Diagnosis not present

## 2018-02-12 DIAGNOSIS — Z950 Presence of cardiac pacemaker: Secondary | ICD-10-CM | POA: Diagnosis not present

## 2018-02-12 DIAGNOSIS — D369 Benign neoplasm, unspecified site: Secondary | ICD-10-CM | POA: Diagnosis not present

## 2018-02-12 DIAGNOSIS — I5032 Chronic diastolic (congestive) heart failure: Secondary | ICD-10-CM | POA: Diagnosis not present

## 2018-02-13 ENCOUNTER — Encounter: Payer: Self-pay | Admitting: Cardiovascular Disease

## 2018-02-13 ENCOUNTER — Ambulatory Visit (INDEPENDENT_AMBULATORY_CARE_PROVIDER_SITE_OTHER): Payer: Medicare Other | Admitting: Cardiovascular Disease

## 2018-02-13 VITALS — BP 118/80 | HR 102 | Ht 68.0 in | Wt 184.0 lb

## 2018-02-13 DIAGNOSIS — I714 Abdominal aortic aneurysm, without rupture, unspecified: Secondary | ICD-10-CM

## 2018-02-13 DIAGNOSIS — R0609 Other forms of dyspnea: Secondary | ICD-10-CM | POA: Diagnosis not present

## 2018-02-13 DIAGNOSIS — I4891 Unspecified atrial fibrillation: Secondary | ICD-10-CM | POA: Diagnosis not present

## 2018-02-13 DIAGNOSIS — I495 Sick sinus syndrome: Secondary | ICD-10-CM

## 2018-02-13 DIAGNOSIS — Z950 Presence of cardiac pacemaker: Secondary | ICD-10-CM

## 2018-02-13 MED ORDER — METOPROLOL TARTRATE 25 MG PO TABS: 25.0000 mg | ORAL_TABLET | Freq: Two times a day (BID) | ORAL | 6 refills | Status: DC

## 2018-02-13 NOTE — Patient Instructions (Signed)
Medication Instructions:   Begin Lopressor 25mg  twice a day.  Continue all other medications.    Labwork: none  Testing/Procedures: none  Follow-Up: 2 mo   Any Other Special Instructions Will Be Listed Below (If Applicable).  If you need a refill on your cardiac medications before your next appointment, please call your pharmacy.

## 2018-02-13 NOTE — Progress Notes (Signed)
SUBJECTIVE: The patient presents for routine follow-up.  He underwent implantation of a St. Jude's dual-chamber pacemaker on 12/02/2017 by Dr. Rayann Heman for symptomatic bradycardia.  He also has paroxysmal atrial fibrillation.  Nuclear stress test on 09/04/2017 was normal, EF 65%.  He denies exertional chest pain.  He has shortness of breath when walking his dog.  He has not noticed marked symptom improvement since pacemaker implantation.  ECG performed in the office today which I ordered and personally interpreted demonstrates rapid atrial fibrillation with an isolated PVC, heart rate 105 bpm.  Review of Systems: As per "subjective", otherwise negative.  Allergies  Allergen Reactions  . Flexeril [Cyclobenzaprine Hcl] Nausea And Vomiting  . Percocet [Oxycodone-Acetaminophen] Nausea And Vomiting  . Vicodin [Hydrocodone-Acetaminophen] Nausea And Vomiting  . Sulfonamide Derivatives Rash    Current Outpatient Medications  Medication Sig Dispense Refill  . alfuzosin (UROXATRAL) 10 MG 24 hr tablet Take 10 mg by mouth daily with breakfast.    . amLODipine (NORVASC) 2.5 MG tablet TAKE ONE TABLET BY MOUTH DAILY 90 tablet 3  . CALCIUM-MAGNESIUM-ZINC PO Take 1 tablet by mouth daily.    . Cholecalciferol (VITAMIN D) 400 UNITS capsule Take 400 Units by mouth daily.     . dorzolamide (TRUSOPT) 2 % ophthalmic solution Place 1 drop into the right eye 2 (two) times daily.     Marland Kitchen esomeprazole (NEXIUM) 20 MG capsule Take 20 mg by mouth daily.    . finasteride (PROSCAR) 5 MG tablet Take 5 mg by mouth daily.      . furosemide (LASIX) 20 MG tablet Take 20 mg by mouth daily.     Marland Kitchen latanoprost (XALATAN) 0.005 % ophthalmic solution Place 1 drop into both eyes at bedtime.     . NON FORMULARY Apply 1 application topically daily as needed (pain). CBD Foot Cream    . Omega-3 Fatty Acids (FISH OIL) 1200 MG CAPS Take 1,200 mg by mouth daily.     . rivaroxaban (XARELTO) 20 MG TABS tablet Take 1 tablet (20 mg  total) by mouth daily with supper. Resume on 01/20/2018 30 tablet 6  . UNABLE TO FIND at bedtime. On CPAP     No current facility-administered medications for this visit.     Past Medical History:  Diagnosis Date  . AAA (abdominal aortic aneurysm) ([place])   . Atrial fibrillation (HCC)    bicarbonate perfusion study 10/11 EF 69%. small defects no ischemia. 7 metastases acheived. ER visit 11/10/09 ruled out MI normal sinus rhythm orthostasis after sublingual nitroglycerin   . BPH (benign prostatic hyperplasia)   . Chronic back pain   . GERD (gastroesophageal reflux disease)   . Hypertension   . Orthostatic hypotension   . PONV (postoperative nausea and vomiting)   . Sleep apnea    CPAP at night  . Stress fracture of foot    left  . Swelling of both lower extremities     Past Surgical History:  Procedure Laterality Date  . CHOLECYSTECTOMY  2012   [facility] by Dr. Geroge Baseman  . COLONOSCOPY N/A 08/27/2012   Procedure: COLONOSCOPY;  Surgeon: Rogene Houston, MD;  Location: [facility];  Service: Endoscopy;  Laterality: N/A;  325  . COLONOSCOPY N/A 01/12/2018   Procedure: COLONOSCOPY;  Surgeon: Rogene Houston, MD;  Location: [facility];  Service: Endoscopy;  Laterality: N/A;  Has staff Christmas party at his house at lunch today per office  . HERNIA REPAIR Bilateral  Inguinal and umbilical  . INCISIONAL HERNIA REPAIR N/A 01/31/2014   Procedure: Fatima Blank HERNIORRHAPHY WITH MESH;  Surgeon: Jamesetta So, MD;  Location: [facility] ORS;  Service: General;  Laterality: N/A;  . INSERTION OF MESH N/A 01/31/2014   Procedure: INSERTION OF MESH;  Surgeon: Jamesetta So, MD;  Location: [facility] ORS;  Service: General;  Laterality: N/A;  . PACEMAKER IMPLANT N/A 12/02/2017   Procedure: PACEMAKER IMPLANT;  Surgeon: [name], MD;  Location: [facility];  Service: Cardiovascular;  Laterality: N/A;  . POLYPECTOMY  01/12/2018   Procedure: POLYPECTOMY;  Surgeon: Rogene Houston, MD;  Location:  [facility];  Service: Endoscopy;;  transverse colon polyp, ascending colon polyps x2, hepatic flexure polyps x2   . SHOULDER SURGERY Right 2005?   for open rotator cuff repair. [place]  . WOUND DEBRIDEMENT Right 01/31/2014   Procedure: DEBRIDEMENT WOUND RIGHT LEG;  Surgeon: Jamesetta So, MD;  Location: [facility];  Service: General;  Laterality: Right;  . WRIST FRACTURE SURGERY Right     Social History   Socioeconomic History  . Marital status: Divorced    Spouse name: Not on file  . Number of children: Not on file  . Years of education: Not on file  . Highest education level: Not on file  Occupational History  . Not on file  Social Needs  . Financial resource strain: Not on file  . Food insecurity:    Worry: Not on file    Inability: Not on file  . Transportation needs:    Medical: Not on file    Non-medical: Not on file  Tobacco Use  . Smoking status: Former Smoker    Packs/day: 1.00    Years: 10.00    Pack years: 10.00    Types: Cigarettes    Start date: 10/15/1943    Last attempt to quit: 01/28/1954    Years since quitting: 64.0  . Smokeless tobacco: Never Used  . Tobacco comment: tobacco use - no  Substance and Sexual Activity  . Alcohol use: No    Alcohol/week: 0.0 standard drinks  . Drug use: No  . Sexual activity: Not on file  Lifestyle  . Physical activity:    Days per week: Not on file    Minutes per session: Not on file  . Stress: Not on file  Relationships  . Social connections:    Talks on phone: Not on file    Gets together: Not on file    Attends religious service: Not on file    Active member of club or organization: Not on file    Attends meetings of clubs or organizations: Not on file    Relationship status: Not on file  . Intimate partner violence:    Fear of current or ex partner: Not on file    Emotionally abused: Not on file    Physically abused: Not on file    Forced sexual activity: Not on file  Other Topics Concern  .  Not on file  Social History Narrative  . Not on file     Vitals:   02/13/18 1055  BP: 118/80  Pulse: (!) 102  SpO2: 90%  Weight: 184 lb (83.5 kg)  Height: 5\' 8"  (1.727 m)    Wt Readings from Last 3 Encounters:  02/13/18 184 lb (83.5 kg)  01/11/18 180 lb 5.4 oz (81.8 kg)  12/03/17 175 lb (79.4 kg)     PHYSICAL EXAM General: NAD HEENT: Normal.  Neck: No JVD, no thyromegaly. Lungs: Clear to auscultation bilaterally with normal respiratory effort. CV: Mildly tachycardic, irregular rhythm, normal S1/S2, no S3, no murmur. No pretibial or periankle edema.  Abdomen: Soft, nontender, no distention.  Neurologic: Alert and oriented.  Psych: Normal affect. Skin: Normal. Musculoskeletal: No gross deformities.    ECG: Reviewed above under Subjective   Labs: Lab Results  Component Value Date/Time   K 3.9 01/11/2018 07:02 AM   BUN 12 01/11/2018 07:02 AM   BUN 10 11/17/2017 11:55 AM   CREATININE 0.89 01/11/2018 07:02 AM   CREATININE 0.91 08/12/2014 08:25 AM   ALT 9 01/11/2018 07:02 AM   HGB 10.7 (L) 01/13/2018 05:04 AM   HGB 13.7 11/17/2017 11:55 AM     Lipids: No results found for: LDLCALC, LDLDIRECT, CHOL, TRIG, HDL     ASSESSMENT AND PLAN: 1.  Symptomatic bradycardia: Status post implantation of St. Jude pacemaker in November 2019.  Followed by Dr. Rayann Heman.  2.  Paroxysmal atrial fibrillation: He is short of breath with exertion and he is in atrial fibrillation with a mildly elevated heart rate. I will start Lopressor 25 mg twice daily.  Anticoagulated with Xarelto.  3.  Hypertension: Controlled.  I will monitor given addition of Lopressor.  4. Abdominal aortic aneurysm: 4.8 cm on 08/13/2017. Followed by vascular surgery.  5. Lower extremity edema: Likely secondary to venous insufficiency. Compression stockings are recommended.    Disposition: Follow up 2 months   Kate Sable, M.D., F.A.C.C.

## 2018-02-27 ENCOUNTER — Encounter: Payer: Self-pay | Admitting: Internal Medicine

## 2018-02-27 ENCOUNTER — Ambulatory Visit (INDEPENDENT_AMBULATORY_CARE_PROVIDER_SITE_OTHER): Payer: Medicare Other | Admitting: Internal Medicine

## 2018-02-27 VITALS — BP 112/62 | HR 62 | Ht 68.0 in | Wt 182.0 lb

## 2018-02-27 DIAGNOSIS — Z950 Presence of cardiac pacemaker: Secondary | ICD-10-CM

## 2018-02-27 DIAGNOSIS — I480 Paroxysmal atrial fibrillation: Secondary | ICD-10-CM | POA: Diagnosis not present

## 2018-02-27 DIAGNOSIS — I495 Sick sinus syndrome: Secondary | ICD-10-CM

## 2018-02-27 NOTE — Progress Notes (Signed)
PCP: Curlene Labrum, MD Primary Cardiologist: Dr Bronson Ing Primary EP:  Dr Rayann Heman  [name] is a 85 y.o. male who presents today for routine electrophysiology followup.  Since his recent pacemaker implant, the patient reports doing very well.  He is a sports fan and enjoys watching most sports on TV.  [employer] fan.  [place] [employer] fan.  Stable SOB and edema.   Today, he denies symptoms of palpitations, chest pain,  dizziness, presyncope, or syncope.  The patient is otherwise without complaint today.   Past Medical History:  Diagnosis Date  . AAA (abdominal aortic aneurysm) ([place])   . Atrial fibrillation (HCC)    bicarbonate perfusion study 10/11 EF 69%. small defects no ischemia. 7 metastases acheived. ER visit 11/10/09 ruled out MI normal sinus rhythm orthostasis after sublingual nitroglycerin   . BPH (benign prostatic hyperplasia)   . Chronic back pain   . GERD (gastroesophageal reflux disease)   . Hypertension   . Orthostatic hypotension   . PONV (postoperative nausea and vomiting)   . Sleep apnea    CPAP at night  . Stress fracture of foot    left  . Swelling of both lower extremities    Past Surgical History:  Procedure Laterality Date  . CHOLECYSTECTOMY  2012   [facility] by Dr. Geroge Baseman  . COLONOSCOPY N/A 08/27/2012   Procedure: COLONOSCOPY;  Surgeon: Rogene Houston, MD;  [facility];  Service: Endoscopy;  Laterality: N/A;  325  . COLONOSCOPY N/A 01/12/2018   Procedure: COLONOSCOPY;  Surgeon: Rogene Houston, MD;  [facility];  Service: Endoscopy;  Laterality: N/A;  Has staff Christmas party at his house at lunch today per office  . HERNIA REPAIR Bilateral    Inguinal and umbilical  . INCISIONAL HERNIA REPAIR N/A 01/31/2014   Procedure: Fatima Blank HERNIORRHAPHY WITH MESH;  Surgeon: Jamesetta So, MD;  Location: [facility];  Service: General;  Laterality: N/A;  . INSERTION OF MESH N/A 01/31/2014   Procedure: INSERTION OF MESH;   Surgeon: Jamesetta So, MD;  Location: [facility];  Service: General;  Laterality: N/A;  . PACEMAKER IMPLANT N/A 12/02/2017   St Jude Medical Assurity MRI conditional  dual-chamber pacemaker by Dr Rayann Heman for symptomatic sinus bradycardia  . POLYPECTOMY  01/12/2018   Procedure: POLYPECTOMY;  Surgeon: Rogene Houston, MD;  [facility];  Service: Endoscopy;;  transverse colon polyp, ascending colon polyps x2, hepatic flexure polyps x2   . SHOULDER SURGERY Right 2005?   for open rotator cuff repair. [place]  . WOUND DEBRIDEMENT Right 01/31/2014   Procedure: DEBRIDEMENT WOUND RIGHT LEG;  Surgeon: Jamesetta So, MD;  Location: [facility];  Service: General;  Laterality: Right;  . WRIST FRACTURE SURGERY Right     ROS- all systems are reviewed and negative except as per HPI above  Current Outpatient Medications  Medication Sig Dispense Refill  . alfuzosin (UROXATRAL) 10 MG 24 hr tablet Take 10 mg by mouth daily with breakfast.    . amLODipine (NORVASC) 2.5 MG tablet TAKE ONE TABLET BY MOUTH DAILY 90 tablet 3  . CALCIUM-MAGNESIUM-ZINC PO Take 1 tablet by mouth daily.    . Cholecalciferol (VITAMIN D) 400 UNITS capsule Take 400 Units by mouth daily.     . dorzolamide (TRUSOPT) 2 % ophthalmic solution Place 1 drop into the right eye 2 (two) times daily.     Marland Kitchen esomeprazole (NEXIUM) 20 MG capsule  Take 20 mg by mouth daily.    . finasteride (PROSCAR) 5 MG tablet Take 5 mg by mouth daily.      . furosemide (LASIX) 20 MG tablet Take 20 mg by mouth daily.     Marland Kitchen latanoprost (XALATAN) 0.005 % ophthalmic solution Place 1 drop into both eyes at bedtime.     . metoprolol tartrate (LOPRESSOR) 25 MG tablet Take 1 tablet (25 mg total) by mouth 2 (two) times daily. 60 tablet 6  . NON FORMULARY Apply 1 application topically daily as needed (pain). CBD Foot Cream    . Omega-3 Fatty Acids (FISH OIL) 1200 MG CAPS Take 1,200 mg by mouth daily.     . rivaroxaban (XARELTO) 20 MG TABS tablet Take 1  tablet (20 mg total) by mouth daily with supper. Resume on 01/20/2018 30 tablet 6  . UNABLE TO FIND at bedtime. On CPAP     No current facility-administered medications for this visit.     Physical Exam: Vitals:   02/27/18 1114  BP: 112/62  Pulse: 62  SpO2: 97%  Weight: 182 lb (82.6 kg)  Height: 5\' 8"  (1.727 m)    GEN- The patient is well appearing, alert and oriented x 3 today.   Head- normocephalic, atraumatic Eyes-  Sclera clear, conjunctiva pink Ears- hearing intact Oropharynx- clear Lungs- Clear to ausculation bilaterally, normal work of breathing Chest- pacemaker pocket is well healed Heart- Regular rate and rhythm, no murmurs, rubs or gallops, PMI not laterally displaced GI- soft, NT, ND, + BS Extremities- no clubbing, cyanosis, or edema Wearing [employer] socks today  Pacemaker interrogation- reviewed in detail today,  See PACEART report    Assessment and Plan:  1. Symptomatic sinus bradycardia  Normal pacemaker function See Pace Art report No changes today  2. Paroxysmal atrial fibrillation afib burden is 1.3 % chads2vasc score is 3.  He is on xarelto  3. HTN Stable No change required today  4. SOB/ edema Due to venous insufficiency/ diastolic dysfunction Sodium restriction is advised  [place] Return in a year   [name], [facility] 02/27/2018 11:42 AM

## 2018-02-27 NOTE — Patient Instructions (Signed)
Medication Instructions:   Your physician recommends that you continue on your current medications as directed. Please refer to the Current Medication list given to you today.  Labwork:  NONE  Testing/Procedures:  NONE  Follow-Up: Your physician recommends that you schedule a follow-up appointment in: 1 year. Please schedule this appointment today before leaving the office.  Any Other Special Instructions Will Be Listed Below (If Applicable).  If you need a refill on your cardiac medications before your next appointment, please call your pharmacy.

## 2018-03-05 DIAGNOSIS — I10 Essential (primary) hypertension: Secondary | ICD-10-CM | POA: Diagnosis not present

## 2018-03-05 DIAGNOSIS — E871 Hypo-osmolality and hyponatremia: Secondary | ICD-10-CM | POA: Diagnosis not present

## 2018-03-05 DIAGNOSIS — E559 Vitamin D deficiency, unspecified: Secondary | ICD-10-CM | POA: Diagnosis not present

## 2018-03-05 DIAGNOSIS — I5032 Chronic diastolic (congestive) heart failure: Secondary | ICD-10-CM | POA: Diagnosis not present

## 2018-03-05 DIAGNOSIS — K219 Gastro-esophageal reflux disease without esophagitis: Secondary | ICD-10-CM | POA: Diagnosis not present

## 2018-03-05 DIAGNOSIS — E782 Mixed hyperlipidemia: Secondary | ICD-10-CM | POA: Diagnosis not present

## 2018-03-05 DIAGNOSIS — G4733 Obstructive sleep apnea (adult) (pediatric): Secondary | ICD-10-CM | POA: Diagnosis not present

## 2018-03-09 ENCOUNTER — Encounter: Payer: Medicare Other | Admitting: Internal Medicine

## 2018-03-10 DIAGNOSIS — D126 Benign neoplasm of colon, unspecified: Secondary | ICD-10-CM | POA: Diagnosis not present

## 2018-03-10 DIAGNOSIS — I5032 Chronic diastolic (congestive) heart failure: Secondary | ICD-10-CM | POA: Diagnosis not present

## 2018-03-10 DIAGNOSIS — Z950 Presence of cardiac pacemaker: Secondary | ICD-10-CM | POA: Diagnosis not present

## 2018-03-10 DIAGNOSIS — I480 Paroxysmal atrial fibrillation: Secondary | ICD-10-CM | POA: Diagnosis not present

## 2018-03-10 DIAGNOSIS — D369 Benign neoplasm, unspecified site: Secondary | ICD-10-CM | POA: Diagnosis not present

## 2018-03-10 DIAGNOSIS — I714 Abdominal aortic aneurysm, without rupture: Secondary | ICD-10-CM | POA: Diagnosis not present

## 2018-03-10 DIAGNOSIS — K219 Gastro-esophageal reflux disease without esophagitis: Secondary | ICD-10-CM | POA: Diagnosis not present

## 2018-03-10 DIAGNOSIS — I10 Essential (primary) hypertension: Secondary | ICD-10-CM | POA: Diagnosis not present

## 2018-03-11 DIAGNOSIS — M79671 Pain in right foot: Secondary | ICD-10-CM | POA: Diagnosis not present

## 2018-03-11 DIAGNOSIS — I739 Peripheral vascular disease, unspecified: Secondary | ICD-10-CM | POA: Diagnosis not present

## 2018-03-11 DIAGNOSIS — M79672 Pain in left foot: Secondary | ICD-10-CM | POA: Diagnosis not present

## 2018-03-11 DIAGNOSIS — L110 Acquired keratosis follicularis: Secondary | ICD-10-CM | POA: Diagnosis not present

## 2018-04-03 ENCOUNTER — Encounter: Payer: Medicare Other | Admitting: Internal Medicine

## 2018-04-10 DIAGNOSIS — H401113 Primary open-angle glaucoma, right eye, severe stage: Secondary | ICD-10-CM | POA: Diagnosis not present

## 2018-04-10 DIAGNOSIS — H401122 Primary open-angle glaucoma, left eye, moderate stage: Secondary | ICD-10-CM | POA: Diagnosis not present

## 2018-04-10 DIAGNOSIS — H0100B Unspecified blepharitis left eye, upper and lower eyelids: Secondary | ICD-10-CM | POA: Diagnosis not present

## 2018-04-10 DIAGNOSIS — H0100A Unspecified blepharitis right eye, upper and lower eyelids: Secondary | ICD-10-CM | POA: Diagnosis not present

## 2018-04-15 LAB — CUP PACEART INCLINIC DEVICE CHECK
Battery Remaining Longevity: 120 mo
Battery Voltage: 3.01 V
Brady Statistic RA Percent Paced: 72 %
Brady Statistic RV Percent Paced: 0.81 %
Date Time Interrogation Session: 20200131173012
Implantable Lead Implant Date: 20191105
Implantable Lead Location: 753859
Implantable Lead Location: 753860
Lead Channel Impedance Value: 437.5 Ohm
Lead Channel Impedance Value: 650 Ohm
Lead Channel Pacing Threshold Amplitude: 0.5 V
Lead Channel Pacing Threshold Amplitude: 0.5 V
Lead Channel Pacing Threshold Pulse Width: 0.5 ms
Lead Channel Pacing Threshold Pulse Width: 0.5 ms
Lead Channel Sensing Intrinsic Amplitude: 12 mV
Lead Channel Sensing Intrinsic Amplitude: 3.7 mV
Lead Channel Setting Pacing Amplitude: 2 V
Lead Channel Setting Pacing Amplitude: 2.5 V
Lead Channel Setting Pacing Pulse Width: 0.5 ms
MDC IDC LEAD IMPLANT DT: 20191105
MDC IDC PG IMPLANT DT: 20191105
MDC IDC SET LEADCHNL RV SENSING SENSITIVITY: 2 mV
Pulse Gen Model: 2272
Pulse Gen Serial Number: 9079121

## 2018-04-20 ENCOUNTER — Telehealth: Payer: Self-pay | Admitting: Cardiovascular Disease

## 2018-04-20 NOTE — Telephone Encounter (Signed)
I called and spoke with the patient about rescheduling his appointment given the COVID19 pandemic, in order to avoid unnecessary exposure.  He is stable from a cardiac standpoint with respect to symptoms and is very agreeable to having his appointment rescheduled to a later date and was thankful for the call. 

## 2018-04-20 NOTE — Telephone Encounter (Signed)
Appointment rescheduled till 07/30/2018.

## 2018-04-24 ENCOUNTER — Ambulatory Visit: Payer: Medicare Other | Admitting: Cardiovascular Disease

## 2018-05-27 DIAGNOSIS — M79671 Pain in right foot: Secondary | ICD-10-CM | POA: Diagnosis not present

## 2018-05-27 DIAGNOSIS — I739 Peripheral vascular disease, unspecified: Secondary | ICD-10-CM | POA: Diagnosis not present

## 2018-05-27 DIAGNOSIS — L110 Acquired keratosis follicularis: Secondary | ICD-10-CM | POA: Diagnosis not present

## 2018-05-27 DIAGNOSIS — M79672 Pain in left foot: Secondary | ICD-10-CM | POA: Diagnosis not present

## 2018-05-29 ENCOUNTER — Other Ambulatory Visit: Payer: Self-pay

## 2018-05-29 ENCOUNTER — Ambulatory Visit (INDEPENDENT_AMBULATORY_CARE_PROVIDER_SITE_OTHER): Payer: Medicare Other | Admitting: *Deleted

## 2018-05-29 DIAGNOSIS — I495 Sick sinus syndrome: Secondary | ICD-10-CM | POA: Diagnosis not present

## 2018-05-29 LAB — CUP PACEART REMOTE DEVICE CHECK
Date Time Interrogation Session: 20200501141259
Implantable Lead Implant Date: 20191105
Implantable Lead Implant Date: 20191105
Implantable Lead Location: 753859
Implantable Lead Location: 753860
Implantable Pulse Generator Implant Date: 20191105
Pulse Gen Model: 2272
Pulse Gen Serial Number: 9079121

## 2018-06-04 ENCOUNTER — Encounter: Payer: Self-pay | Admitting: Cardiology

## 2018-06-04 NOTE — Progress Notes (Signed)
Remote pacemaker transmission.   

## 2018-07-29 ENCOUNTER — Telehealth: Payer: Self-pay | Admitting: *Deleted

## 2018-07-29 NOTE — Telephone Encounter (Signed)

## 2018-07-30 ENCOUNTER — Other Ambulatory Visit: Payer: Self-pay

## 2018-07-30 ENCOUNTER — Encounter: Payer: Self-pay | Admitting: Cardiovascular Disease

## 2018-07-30 ENCOUNTER — Ambulatory Visit (INDEPENDENT_AMBULATORY_CARE_PROVIDER_SITE_OTHER): Payer: Medicare Other | Admitting: Cardiovascular Disease

## 2018-07-30 VITALS — BP 123/71 | HR 61 | Ht 68.0 in | Wt 188.0 lb

## 2018-07-30 DIAGNOSIS — R5383 Other fatigue: Secondary | ICD-10-CM

## 2018-07-30 DIAGNOSIS — Z950 Presence of cardiac pacemaker: Secondary | ICD-10-CM | POA: Diagnosis not present

## 2018-07-30 DIAGNOSIS — I10 Essential (primary) hypertension: Secondary | ICD-10-CM | POA: Diagnosis not present

## 2018-07-30 DIAGNOSIS — I714 Abdominal aortic aneurysm, without rupture, unspecified: Secondary | ICD-10-CM

## 2018-07-30 DIAGNOSIS — R0609 Other forms of dyspnea: Secondary | ICD-10-CM

## 2018-07-30 DIAGNOSIS — I495 Sick sinus syndrome: Secondary | ICD-10-CM | POA: Diagnosis not present

## 2018-07-30 DIAGNOSIS — I480 Paroxysmal atrial fibrillation: Secondary | ICD-10-CM

## 2018-07-30 MED ORDER — METOPROLOL TARTRATE 25 MG PO TABS: 12.5000 mg | ORAL_TABLET | Freq: Two times a day (BID) | ORAL | 1 refills | Status: DC

## 2018-07-30 NOTE — Progress Notes (Signed)
SUBJECTIVE: The patient presents for routine follow-up.  He underwent implantation of a St. Jude's dual-chamber pacemaker on 12/02/2017 by Dr. Rayann Heman for symptomatic bradycardia.  He also has paroxysmal atrial fibrillation.  Nuclear stress test on 09/04/2017 was normal, EF 65%.  Device interrogation in May was normal.  For the past 1 to 2 months he has been complaining of exertional fatigue.  He denies exertional chest pain.  He can walk to the road and back and feels tired out.  He is unable to do some of his routine gardening which she was able to do earlier this year.  He denies palpitations and leg swelling.    Review of Systems: As per "subjective", otherwise negative.  Allergies  Allergen Reactions  . Flexeril [Cyclobenzaprine Hcl] Nausea And Vomiting  . Percocet [Oxycodone-Acetaminophen] Nausea And Vomiting  . Vicodin [Hydrocodone-Acetaminophen] Nausea And Vomiting  . Sulfonamide Derivatives Rash    Current Outpatient Medications  Medication Sig Dispense Refill  . alfuzosin (UROXATRAL) 10 MG 24 hr tablet Take 10 mg by mouth daily with breakfast.    . amLODipine (NORVASC) 2.5 MG tablet TAKE ONE TABLET BY MOUTH DAILY 90 tablet 3  . CALCIUM-MAGNESIUM-ZINC PO Take 1 tablet by mouth daily.    . Cholecalciferol (VITAMIN D) 400 UNITS capsule Take 400 Units by mouth daily.     . dorzolamide (TRUSOPT) 2 % ophthalmic solution Place 1 drop into the right eye 2 (two) times daily.     Marland Kitchen esomeprazole (NEXIUM) 20 MG capsule Take 20 mg by mouth daily.    . finasteride (PROSCAR) 5 MG tablet Take 5 mg by mouth daily.      . furosemide (LASIX) 20 MG tablet Take 20 mg by mouth daily.     Marland Kitchen latanoprost (XALATAN) 0.005 % ophthalmic solution Place 1 drop into both eyes at bedtime.     . metoprolol tartrate (LOPRESSOR) 25 MG tablet Take 1 tablet (25 mg total) by mouth 2 (two) times daily. 60 tablet 6  . NON FORMULARY Apply 1 application topically daily as needed (pain). CBD Foot Cream    .  Omega-3 Fatty Acids (FISH OIL) 1200 MG CAPS Take 1,200 mg by mouth daily.     . rivaroxaban (XARELTO) 20 MG TABS tablet Take 1 tablet (20 mg total) by mouth daily with supper. Resume on 01/20/2018 30 tablet 6  . UNABLE TO FIND at bedtime. On CPAP     No current facility-administered medications for this visit.     Past Medical History:  Diagnosis Date  . AAA (abdominal aortic aneurysm) ([place])   . Atrial fibrillation (HCC)    bicarbonate perfusion study 10/11 EF 69%. small defects no ischemia. 7 metastases acheived. ER visit 11/10/09 ruled out MI normal sinus rhythm orthostasis after sublingual nitroglycerin   . BPH (benign prostatic hyperplasia)   . Chronic back pain   . GERD (gastroesophageal reflux disease)   . Hypertension   . Orthostatic hypotension   . PONV (postoperative nausea and vomiting)   . Sleep apnea    CPAP at night  . Stress fracture of foot    left  . Swelling of both lower extremities     Past Surgical History:  Procedure Laterality Date  . CHOLECYSTECTOMY  2012   [facility] by Dr. Geroge Baseman  . COLONOSCOPY N/A 08/27/2012   Procedure: COLONOSCOPY;  Surgeon: Rogene Houston, MD;  [facility];  Service: Endoscopy;  Laterality: N/A;  325  . COLONOSCOPY N/A 01/12/2018  Procedure: COLONOSCOPY;  Surgeon: Rogene Houston, MD;  [facility];  Service: Endoscopy;  Laterality: N/A;  Has staff Christmas party at his house at lunch today per office  . HERNIA REPAIR Bilateral    Inguinal and umbilical  . INCISIONAL HERNIA REPAIR N/A 01/31/2014   Procedure: Fatima Blank HERNIORRHAPHY WITH MESH;  Surgeon: Jamesetta So, MD;  Location: [facility];  Service: General;  Laterality: N/A;  . INSERTION OF MESH N/A 01/31/2014   Procedure: INSERTION OF MESH;  Surgeon: Jamesetta So, MD;  Location: [facility];  Service: General;  Laterality: N/A;  . PACEMAKER IMPLANT N/A 12/02/2017   St Jude Medical Assurity MRI conditional  dual-chamber pacemaker by Dr Rayann Heman for  symptomatic sinus bradycardia  . POLYPECTOMY  01/12/2018   Procedure: POLYPECTOMY;  Surgeon: Rogene Houston, MD;  [facility];  Service: Endoscopy;;  transverse colon polyp, ascending colon polyps x2, hepatic flexure polyps x2   . SHOULDER SURGERY Right 2005?   for open rotator cuff repair. [place]  . WOUND DEBRIDEMENT Right 01/31/2014   Procedure: DEBRIDEMENT WOUND RIGHT LEG;  Surgeon: Jamesetta So, MD;  Location: [facility];  Service: General;  Laterality: Right;  . WRIST FRACTURE SURGERY Right     Social History   Socioeconomic History  . Marital status: Divorced    Spouse name: Not on file  . Number of children: Not on file  . Years of education: Not on file  . Highest education level: Not on file  Occupational History  . Not on file  Social Needs  . Financial resource strain: Not on file  . Food insecurity    Worry: Not on file    Inability: Not on file  . Transportation needs    Medical: Not on file    Non-medical: Not on file  Tobacco Use  . Smoking status: Former Smoker    Packs/day: 1.00    Years: 10.00    Pack years: 10.00    Types: Cigarettes    Start date: 10/15/1943    Quit date: 01/28/1954    Years since quitting: 64.5  . Smokeless tobacco: Never Used  . Tobacco comment: tobacco use - no  Substance and Sexual Activity  . Alcohol use: No    Alcohol/week: 0.0 standard drinks  . Drug use: No  . Sexual activity: Not on file  Lifestyle  . Physical activity    Days per week: Not on file    Minutes per session: Not on file  . Stress: Not on file  Relationships  . Social Herbalist on phone: Not on file    Gets together: Not on file    Attends religious service: Not on file    Active member of club or organization: Not on file    Attends meetings of clubs or organizations: Not on file    Relationship status: Not on file  . Intimate partner violence    Fear of current or ex partner: Not on file    Emotionally abused: Not  on file    Physically abused: Not on file    Forced sexual activity: Not on file  Other Topics Concern  . Not on file  Social History Narrative  . Not on file     Vitals:   07/30/18 1058  BP: 123/71  Pulse: 61  SpO2: 96%  Weight: 188 lb (85.3 kg)  Height: 5\' 8"  (1.727 m)    Wt Readings from  Last 3 Encounters:  07/30/18 188 lb (85.3 kg)  02/27/18 182 lb (82.6 kg)  02/13/18 184 lb (83.5 kg)     PHYSICAL EXAM General: NAD HEENT: Normal. Neck: No JVD, no thyromegaly. Lungs: Clear to auscultation bilaterally with normal respiratory effort. CV: Regular rate and rhythm, normal S1/S2, no S3/S4, no murmur. No pretibial or periankle edema.    Abdomen: Soft, nontender, no distention.  Neurologic: Alert and oriented.  Psych: Normal affect. Skin: Normal. Musculoskeletal: No gross deformities.    ECG: Reviewed above under Subjective   Labs: Lab Results  Component Value Date/Time   K 3.9 01/11/2018 07:02 AM   BUN 12 01/11/2018 07:02 AM   BUN 10 11/17/2017 11:55 AM   CREATININE 0.89 01/11/2018 07:02 AM   CREATININE 0.91 08/12/2014 08:25 AM   ALT 9 01/11/2018 07:02 AM   HGB 10.7 (L) 01/13/2018 05:04 AM   HGB 13.7 11/17/2017 11:55 AM     Lipids: No results found for: LDLCALC, LDLDIRECT, CHOL, TRIG, HDL     ASSESSMENT AND PLAN:  1.  Symptomatic bradycardia: Status post implantation of St. Jude pacemaker in November 2019.  Followed by Dr. Rayann Heman.  Device interrogation in May 2020 was normal.  2.  Paroxysmal atrial fibrillation: Symptomatically stable.  Given exertional fatigue, I will reduce Lopressor to 12.5 mg twice daily to see if he is experiencing adverse side effects.  Anticoagulated with Xarelto.  3.  Hypertension: Controlled.  No changes to therapy.  4. Abdominal aortic aneurysm: 4.8 cm on 08/13/2017. Followed by vascular surgery.  5. Lower extremity edema: Likely secondary to venous insufficiency. Compression stockings are recommended.  6.   Exertional fatigue: Hemoglobin was 11.5 on 02/05/2018.  I will obtain a comprehensive metabolic panel and a CBC.  I will reduce the dose of Lopressor to see if he is experiencing adverse side effects.   Disposition: Follow up 6 weeks with [facility]   Kate Sable, M.D., F.A.C.C.

## 2018-07-30 NOTE — Patient Instructions (Signed)
Your physician recommends that you schedule a follow-up appointment in: [place] physician has recommended you make the following change in your medication:   DECREASE LOPRESSOR 12.5 (1/2 TABLET) TWICE DAILY   Your physician recommends that you return for lab work CBC/CMP  Thank you for choosing [facility]!!

## 2018-08-03 DIAGNOSIS — I480 Paroxysmal atrial fibrillation: Secondary | ICD-10-CM | POA: Diagnosis not present

## 2018-08-04 ENCOUNTER — Telehealth: Payer: Self-pay | Admitting: *Deleted

## 2018-08-04 NOTE — Telephone Encounter (Signed)
Notes recorded by Laurine Blazer, LPN on 07/03/628 at 1:60 PM EDT  Patient notified. Copy to pmd. Follow up scheduled for August with Dr. Bronson Ing.    ------   Notes recorded by Herminio Commons, MD on 08/03/2018 at 3:06 PM EDT  Normal renal function. Very mildly anemic.

## 2018-08-12 DIAGNOSIS — L110 Acquired keratosis follicularis: Secondary | ICD-10-CM | POA: Diagnosis not present

## 2018-08-12 DIAGNOSIS — M79672 Pain in left foot: Secondary | ICD-10-CM | POA: Diagnosis not present

## 2018-08-12 DIAGNOSIS — M79671 Pain in right foot: Secondary | ICD-10-CM | POA: Diagnosis not present

## 2018-08-12 DIAGNOSIS — I739 Peripheral vascular disease, unspecified: Secondary | ICD-10-CM | POA: Diagnosis not present

## 2018-08-28 ENCOUNTER — Ambulatory Visit (INDEPENDENT_AMBULATORY_CARE_PROVIDER_SITE_OTHER): Payer: Medicare Other | Admitting: *Deleted

## 2018-08-28 ENCOUNTER — Other Ambulatory Visit: Payer: Self-pay

## 2018-08-28 ENCOUNTER — Telehealth: Payer: Self-pay | Admitting: Internal Medicine

## 2018-08-28 DIAGNOSIS — I495 Sick sinus syndrome: Secondary | ICD-10-CM | POA: Diagnosis not present

## 2018-08-28 LAB — CUP PACEART REMOTE DEVICE CHECK
Battery Remaining Longevity: 110 mo
Battery Remaining Percentage: 95.5 %
Battery Voltage: 3.02 V
Brady Statistic AP VP Percent: 1 %
Brady Statistic AP VS Percent: 97 %
Brady Statistic AS VP Percent: 1 %
Brady Statistic AS VS Percent: 2.7 %
Brady Statistic RA Percent Paced: 96 %
Brady Statistic RV Percent Paced: 1 %
Date Time Interrogation Session: 20200731060016
Implantable Lead Implant Date: 20191105
Implantable Lead Implant Date: 20191105
Implantable Lead Location: 753859
Implantable Lead Location: 753860
Implantable Pulse Generator Implant Date: 20191105
Lead Channel Impedance Value: 450 Ohm
Lead Channel Impedance Value: 650 Ohm
Lead Channel Pacing Threshold Amplitude: 0.5 V
Lead Channel Pacing Threshold Amplitude: 0.5 V
Lead Channel Pacing Threshold Pulse Width: 0.5 ms
Lead Channel Pacing Threshold Pulse Width: 0.5 ms
Lead Channel Sensing Intrinsic Amplitude: 3.8 mV
Lead Channel Sensing Intrinsic Amplitude: 9.6 mV
Lead Channel Setting Pacing Amplitude: 2 V
Lead Channel Setting Pacing Amplitude: 2.5 V
Lead Channel Setting Pacing Pulse Width: 0.5 ms
Lead Channel Setting Sensing Sensitivity: 2 mV
Pulse Gen Model: 2272
Pulse Gen Serial Number: 9079121

## 2018-08-28 NOTE — Telephone Encounter (Signed)
Patient called [facility] in regards to the Pacer CK home phone visit 08-28-2018. Patient needs instructions on how to do this. Please call [phone]

## 2018-08-28 NOTE — Telephone Encounter (Signed)
LMOM to call office.  Transmission received and no need for 2nd transmission.

## 2018-08-28 NOTE — Telephone Encounter (Signed)
I let the pt know per Jenny Reichmann, RN that we did receive his transmission and he do not need to send another transmission. Pt verbalized understanding.

## 2018-09-03 ENCOUNTER — Encounter: Payer: Self-pay | Admitting: Cardiology

## 2018-09-03 NOTE — Progress Notes (Signed)
Remote pacemaker transmission.   

## 2018-09-14 DIAGNOSIS — I10 Essential (primary) hypertension: Secondary | ICD-10-CM | POA: Diagnosis not present

## 2018-09-14 DIAGNOSIS — E782 Mixed hyperlipidemia: Secondary | ICD-10-CM | POA: Diagnosis not present

## 2018-09-14 DIAGNOSIS — E871 Hypo-osmolality and hyponatremia: Secondary | ICD-10-CM | POA: Diagnosis not present

## 2018-09-14 DIAGNOSIS — I480 Paroxysmal atrial fibrillation: Secondary | ICD-10-CM | POA: Diagnosis not present

## 2018-09-14 DIAGNOSIS — G4733 Obstructive sleep apnea (adult) (pediatric): Secondary | ICD-10-CM | POA: Diagnosis not present

## 2018-09-14 DIAGNOSIS — K219 Gastro-esophageal reflux disease without esophagitis: Secondary | ICD-10-CM | POA: Diagnosis not present

## 2018-09-14 DIAGNOSIS — I5032 Chronic diastolic (congestive) heart failure: Secondary | ICD-10-CM | POA: Diagnosis not present

## 2018-09-14 DIAGNOSIS — Z950 Presence of cardiac pacemaker: Secondary | ICD-10-CM | POA: Diagnosis not present

## 2018-09-15 ENCOUNTER — Encounter: Payer: Self-pay | Admitting: Cardiovascular Disease

## 2018-09-15 ENCOUNTER — Ambulatory Visit (INDEPENDENT_AMBULATORY_CARE_PROVIDER_SITE_OTHER): Payer: Medicare Other | Admitting: Cardiovascular Disease

## 2018-09-15 ENCOUNTER — Other Ambulatory Visit: Payer: Self-pay

## 2018-09-15 VITALS — BP 126/76 | HR 60 | Ht 68.0 in | Wt 187.4 lb

## 2018-09-15 DIAGNOSIS — I714 Abdominal aortic aneurysm, without rupture, unspecified: Secondary | ICD-10-CM

## 2018-09-15 DIAGNOSIS — I10 Essential (primary) hypertension: Secondary | ICD-10-CM

## 2018-09-15 DIAGNOSIS — I495 Sick sinus syndrome: Secondary | ICD-10-CM

## 2018-09-15 DIAGNOSIS — R0609 Other forms of dyspnea: Secondary | ICD-10-CM | POA: Diagnosis not present

## 2018-09-15 DIAGNOSIS — R600 Localized edema: Secondary | ICD-10-CM

## 2018-09-15 DIAGNOSIS — Z950 Presence of cardiac pacemaker: Secondary | ICD-10-CM

## 2018-09-15 DIAGNOSIS — I480 Paroxysmal atrial fibrillation: Secondary | ICD-10-CM | POA: Diagnosis not present

## 2018-09-15 MED ORDER — POTASSIUM CHLORIDE CRYS ER 20 MEQ PO TBCR: 20.0000 meq | EXTENDED_RELEASE_TABLET | Freq: Every day | ORAL | 1 refills | Status: DC

## 2018-09-15 MED ORDER — FUROSEMIDE 40 MG PO TABS: 40.0000 mg | ORAL_TABLET | Freq: Every day | ORAL | 1 refills | Status: DC

## 2018-09-15 NOTE — Progress Notes (Addendum)
SUBJECTIVE: The patient presents for follow-up of exertional fatigue.  Hemoglobin was 12.1.  At his most recent visit I decreased Lopressor to 12.5 mg twice daily.  In summary, he underwent implantation of a [employer]. Jude's dual-chamber pacemaker on 12/02/2017 by Dr. Rayann Heman for symptomatic bradycardia.  He also has paroxysmal atrial fibrillation.  Nuclear stress test on 09/04/2017 was normal, EF 65%.  He is continuing to experience exertional dyspnea when walking to the mailbox or walking his dog.  He denies exertional chest pain and tightness.  He said all the symptoms began after getting a pacemaker.  Device interrogation which I personally reviewed performed on 08/28/2018 demonstrated normal function.  He did not have a high burden of atrial fibrillation.  He denies orthopnea and paroxysmal nocturnal dyspnea.  He has chronic bilateral leg swelling for which he takes Lasix 20 mg daily.  He does not feel like he urinates a lot.    Review of Systems: As per "subjective", otherwise negative.  Allergies  Allergen Reactions  . Flexeril [Cyclobenzaprine Hcl] Nausea And Vomiting  . Percocet [Oxycodone-Acetaminophen] Nausea And Vomiting  . Vicodin [Hydrocodone-Acetaminophen] Nausea And Vomiting  . Sulfonamide Derivatives Rash    Current Outpatient Medications  Medication Sig Dispense Refill  . alfuzosin (UROXATRAL) 10 MG 24 hr tablet Take 10 mg by mouth daily with breakfast.    . amLODipine (NORVASC) 2.5 MG tablet TAKE ONE TABLET BY MOUTH DAILY 90 tablet 3  . CALCIUM-MAGNESIUM-ZINC PO Take 1 tablet by mouth daily.    . Cholecalciferol (VITAMIN D) 400 UNITS capsule Take 400 Units by mouth daily.     . dorzolamide (TRUSOPT) 2 % ophthalmic solution Place 1 drop into the right eye 2 (two) times daily.     Marland Kitchen esomeprazole (NEXIUM) 20 MG capsule Take 20 mg by mouth daily.    . finasteride (PROSCAR) 5 MG tablet Take 5 mg by mouth daily.      . furosemide (LASIX) 20 MG tablet Take 20 mg by  mouth daily.     Marland Kitchen latanoprost (XALATAN) 0.005 % ophthalmic solution Place 1 drop into both eyes at bedtime.     . metoprolol tartrate (LOPRESSOR) 25 MG tablet Take 0.5 tablets (12.5 mg total) by mouth 2 (two) times daily. 90 tablet 1  . NON FORMULARY Apply 1 application topically daily as needed (pain). CBD Foot Cream    . Omega-3 Fatty Acids (FISH OIL) 1200 MG CAPS Take 1,200 mg by mouth daily.     . rivaroxaban (XARELTO) 20 MG TABS tablet Take 1 tablet (20 mg total) by mouth daily with supper. Resume on 01/20/2018 30 tablet 6  . UNABLE TO FIND at bedtime. On CPAP     No current facility-administered medications for this visit.     Past Medical History:  Diagnosis Date  . AAA (abdominal aortic aneurysm) ([place])   . Atrial fibrillation (HCC)    bicarbonate perfusion study 10/11 EF 69%. small defects no ischemia. 7 metastases acheived. ER visit 11/10/09 ruled out MI normal sinus rhythm orthostasis after sublingual nitroglycerin   . BPH (benign prostatic hyperplasia)   . Chronic back pain   . GERD (gastroesophageal reflux disease)   . Hypertension   . Orthostatic hypotension   . PONV (postoperative nausea and vomiting)   . Sleep apnea    CPAP at night  . Stress fracture of foot    left  . Swelling of both lower extremities     Past Surgical History:  Procedure Laterality  Date  . CHOLECYSTECTOMY  2012   [facility] by Dr. Geroge Baseman  . COLONOSCOPY N/A 08/27/2012   Procedure: COLONOSCOPY;  Surgeon: Rogene Houston, MD;  Location: [facility];  Service: Endoscopy;  Laterality: N/A;  325  . COLONOSCOPY N/A 01/12/2018   Procedure: COLONOSCOPY;  Surgeon: Rogene Houston, MD;  Location: [facility];  Service: Endoscopy;  Laterality: N/A;  Has staff Christmas party at his house at lunch today per office  . HERNIA REPAIR Bilateral    Inguinal and umbilical  . INCISIONAL HERNIA REPAIR N/A 01/31/2014   Procedure: Fatima Blank HERNIORRHAPHY WITH MESH;  Surgeon: Jamesetta So, MD;   Location: [facility] ORS;  Service: General;  Laterality: N/A;  . INSERTION OF MESH N/A 01/31/2014   Procedure: INSERTION OF MESH;  Surgeon: Jamesetta So, MD;  Location: [facility];  Service: General;  Laterality: N/A;  . PACEMAKER IMPLANT N/A 12/02/2017   [employer] Jude [employer]urity MRI conditional  dual-chamber pacemaker by Dr Rayann Heman for symptomatic sinus bradycardia  . POLYPECTOMY  01/12/2018   Procedure: POLYPECTOMY;  Surgeon: Rogene Houston, MD;  Location: [facility];  Service: Endoscopy;;  transverse colon polyp, ascending colon polyps x2, hepatic flexure polyps x2   . SHOULDER SURGERY Right 2005?   for open rotator cuff repair. [place]  . WOUND DEBRIDEMENT Right 01/31/2014   Procedure: DEBRIDEMENT WOUND RIGHT LEG;  Surgeon: Jamesetta So, MD;  Location: [facility];  Service: General;  Laterality: Right;  . WRIST FRACTURE SURGERY Right     Social History   Socioeconomic History  . Marital status: Divorced    Spouse name: Not on file  . Number of children: Not on file  . Years of education: Not on file  . Highest education level: Not on file  Occupational History  . Not on file  Social Needs  . Financial resource strain: Not on file  . Food insecurity    Worry: Not on file    Inability: Not on file  . Transportation needs    Medical: Not on file    Non-medical: Not on file  Tobacco Use  . Smoking status: Former Smoker    Packs/day: 1.00    Years: 10.00    Pack years: 10.00    Types: Cigarettes    Start date: 10/15/1943    Quit date: 01/28/1954    Years since quitting: 64.6  . Smokeless tobacco: Never Used  . Tobacco comment: tobacco use - no  Substance and Sexual Activity  . Alcohol use: No    Alcohol/week: 0.0 standard drinks  . Drug use: No  . Sexual activity: Not on file  Lifestyle  . Physical activity    Days per week: Not on file    Minutes per session: Not on file  . Stress: Not on file  Relationships  . Social Herbalist on phone: Not on file     Gets together: Not on file    Attends religious service: Not on file    Active member of club or organization: Not on file    Attends meetings of clubs or organizations: Not on file    Relationship status: Not on file  . Intimate partner violence    Fear of current or ex partner: Not on file    Emotionally abused: Not on file    Physically abused: Not on file    Forced sexual activity: Not on file  Other Topics Concern  .  Not on file  Social History Narrative  . Not on file     Vitals:   09/15/18 0914  BP: 126/76  Pulse: 60  SpO2: 97%  Weight: 187 lb 6.4 oz (85 kg)  Height: 5\' 8"  (1.727 m)    Wt Readings from Last 3 Encounters:  09/15/18 187 lb 6.4 oz (85 kg)  07/30/18 188 lb (85.3 kg)  02/27/18 182 lb (82.6 kg)     PHYSICAL EXAM General: NAD HEENT: Normal. Neck: No JVD, no thyromegaly. Lungs: No crackles or wheezes. CV: Regular rate and rhythm, normal S1/S2, no S3/S4, no murmur.  Trace bilateral lower extremity edema. Abdomen: Soft, nontender, no distention.  Neurologic: Alert and oriented.  Psych: Normal affect. Skin: Normal. Musculoskeletal: No gross deformities.    ECG: Reviewed above under Subjective   Labs: Lab Results  Component Value Date/Time   K 3.9 01/11/2018 07:02 AM   BUN 12 01/11/2018 07:02 AM   BUN 10 11/17/2017 11:55 AM   CREATININE 0.89 01/11/2018 07:02 AM   CREATININE 0.91 08/12/2014 08:25 AM   ALT 9 01/11/2018 07:02 AM   HGB 10.7 (L) 01/13/2018 05:04 AM   HGB 13.7 11/17/2017 11:55 AM     Lipids: No results found for: LDLCALC, LDLDIRECT, CHOL, TRIG, HDL     ASSESSMENT AND PLAN: 1. Symptomatic bradycardia: Status post implantation of St. Jude pacemaker in November 2019. Followed by Dr. Rayann Heman.  Device interrogation on 08/28/2018 was normal.  I will arrange for earlier follow-up with EP.  2. Paroxysmal atrial fibrillation: Symptomatically stable.  Continue Lopressor 12.5 mg twice daily. Anticoagulated with Xarelto.  3.  Hypertension: Controlled.  No changes to therapy.  4. Abdominal aortic aneurysm: 4.8 cm on 08/13/2017. Followed by vascular surgery.  5. Lower extremity edema: Likely secondary to venous insufficiency. Compression stockings are recommended.  He is on Lasix 20 mg daily.  I will increase it to 40 mg daily and add potassium chloride 20 mEq daily.  6.  Exertional fatigue: Hemoglobin only 12.1.  Pacemaker function is normal.  He said all of his symptoms began after getting a pacemaker.  I will order a 2-D echocardiogram with Doppler to evaluate cardiac structure, function, and regional wall motion. I will check a BNP.  I will also increase Lasix to 40 mg daily and provide supplemental potassium chloride. If after mentioned studies are unrevealing, I would not rule out the possibility of right and left heart cardiac catheterization.   Disposition: Follow up 8-10 weeks   Kate Sable, M.D., F.A.C.C.

## 2018-09-15 NOTE — Patient Instructions (Addendum)
Your physician recommends that you schedule a follow-up appointment in: [place], [place]  Your physician recommends that you schedule a follow-up appointment WITH DR Rayann Heman IN THE NEXT 2 MONTHS  Your physician has recommended you make the following change in your medication:   INCREASE LASIX 40 MG DAILY   START POTASSIUM 20 [place]   Your physician recommends that you return for lab work BNP  Your physician has requested that you have an echocardiogram. Echocardiography is a painless test that uses sound waves to create images of your heart. It provides your doctor with information about the size and shape of your heart and how well your heart's chambers and valves are working. This procedure takes approximately one hour. There are no restrictions for this procedure.  Thank you for choosing [facility]!!

## 2018-09-15 NOTE — Addendum Note (Signed)
Addended by: Julian Hy T on: 09/15/2018 09:49 AM   Modules accepted: Orders

## 2018-09-16 DIAGNOSIS — R0609 Other forms of dyspnea: Secondary | ICD-10-CM | POA: Diagnosis not present

## 2018-09-17 DIAGNOSIS — I5032 Chronic diastolic (congestive) heart failure: Secondary | ICD-10-CM | POA: Diagnosis not present

## 2018-09-17 DIAGNOSIS — I480 Paroxysmal atrial fibrillation: Secondary | ICD-10-CM | POA: Diagnosis not present

## 2018-09-17 DIAGNOSIS — Z950 Presence of cardiac pacemaker: Secondary | ICD-10-CM | POA: Diagnosis not present

## 2018-09-17 DIAGNOSIS — Z0001 Encounter for general adult medical examination with abnormal findings: Secondary | ICD-10-CM | POA: Diagnosis not present

## 2018-09-17 DIAGNOSIS — G4733 Obstructive sleep apnea (adult) (pediatric): Secondary | ICD-10-CM | POA: Diagnosis not present

## 2018-09-17 DIAGNOSIS — I10 Essential (primary) hypertension: Secondary | ICD-10-CM | POA: Diagnosis not present

## 2018-09-17 DIAGNOSIS — Z6827 Body mass index (BMI) 27.0-27.9, adult: Secondary | ICD-10-CM | POA: Diagnosis not present

## 2018-09-17 DIAGNOSIS — E782 Mixed hyperlipidemia: Secondary | ICD-10-CM | POA: Diagnosis not present

## 2018-10-01 ENCOUNTER — Ambulatory Visit (INDEPENDENT_AMBULATORY_CARE_PROVIDER_SITE_OTHER): Payer: Medicare Other

## 2018-10-01 ENCOUNTER — Other Ambulatory Visit: Payer: Self-pay

## 2018-10-01 DIAGNOSIS — R0609 Other forms of dyspnea: Secondary | ICD-10-CM | POA: Diagnosis not present

## 2018-10-09 ENCOUNTER — Telehealth: Payer: Self-pay | Admitting: Cardiovascular Disease

## 2018-10-09 NOTE — Telephone Encounter (Signed)
Patient called in regards to recent Echo results.

## 2018-10-09 NOTE — Telephone Encounter (Signed)
Notes recorded by Laurine Blazer, LPN on [id number] at [id number] PM EDT  Patient notified. Copy to pmd. Follow up scheduled for 11/20/2018 with Dr. Bronson Ing.    ------   Notes recorded by Laurine Blazer, LPN on [id number] at [id number] PM EDT  Left message to return call.   ------   Notes recorded by Herminio Commons, MD on 10/06/2018 at 9:35 AM EDT  Normal pumping function.

## 2018-10-20 ENCOUNTER — Other Ambulatory Visit: Payer: Self-pay

## 2018-10-20 DIAGNOSIS — I714 Abdominal aortic aneurysm, without rupture, unspecified: Secondary | ICD-10-CM

## 2018-10-21 ENCOUNTER — Other Ambulatory Visit: Payer: Self-pay

## 2018-10-21 ENCOUNTER — Ambulatory Visit (INDEPENDENT_AMBULATORY_CARE_PROVIDER_SITE_OTHER): Payer: Medicare Other | Admitting: Vascular Surgery

## 2018-10-21 ENCOUNTER — Encounter: Payer: Self-pay | Admitting: Vascular Surgery

## 2018-10-21 ENCOUNTER — Ambulatory Visit (HOSPITAL_COMMUNITY)
Admission: RE | Admit: 2018-10-21 | Discharge: 2018-10-21 | Disposition: A | Discharge status: Home / Self Care | Payer: Medicare Other | Source: Ambulatory Visit | Attending: Vascular Surgery | Admitting: Vascular Surgery

## 2018-10-21 VITALS — BP 115/74 | HR 60 | Temp 97.5°F | Resp 18 | Ht 68.0 in | Wt 182.0 lb

## 2018-10-21 DIAGNOSIS — I714 Abdominal aortic aneurysm, without rupture, unspecified: Secondary | ICD-10-CM

## 2018-10-21 NOTE — Progress Notes (Addendum)
Patient name: [name] MRN: [id number] DOB: 12/22/1926 Sex: male  REASON FOR VISIT:   Follow-up of abdominal aortic aneurysm  HPI:   [name] is a pleasant 85 y.o. male who I have been following with an abdominal aortic aneurysm.  I last saw him in July 2019.  By ultrasound the maximum diameter of the aneurysm was 4.8 cm.  It had increased 4 mm in the last year.  I explained that in a normal risk patient we would consider elective repair at 5.5 cm.  Given his age certainly he is at increased risk.  Since I saw him last he does have some right hip pain which is likely related to arthritis.  He denies any significant abdominal pain or back pain.  He is not a smoker.  His blood pressure has been under good control.  There have been no significant changes in his medical history.  He has complained of some bilateral lower extremity swelling which is chronic.  He also admits to dyspnea on exertion.  He said no chest pain.  The patient is on Xarelto for atrial fibrillation.  He does have a pacemaker and is followed by Dr. Rayann Heman.  He has 10 grandchildren and 15 great grandchildren.  Past Medical History:  Diagnosis Date  . AAA (abdominal aortic aneurysm) ([place])   . Atrial fibrillation (HCC)    bicarbonate perfusion study 10/11 EF 69%. small defects no ischemia. 7 metastases acheived. ER visit 11/10/09 ruled out MI normal sinus rhythm orthostasis after sublingual nitroglycerin   . BPH (benign prostatic hyperplasia)   . Chronic back pain   . GERD (gastroesophageal reflux disease)   . Hypertension   . Orthostatic hypotension   . PONV (postoperative nausea and vomiting)   . Sleep apnea    CPAP at night  . Stress fracture of foot    left  . Swelling of both lower extremities     Family History  Problem Relation Age of Onset  . Heart failure Mother   . Heart failure Father   . Cancer Sister     SOCIAL HISTORY: Social History   Tobacco Use  . Smoking status: Former  Smoker    Packs/day: 1.00    Years: 10.00    Pack years: 10.00    Types: Cigarettes    Start date: 10/15/1943    Quit date: 01/28/1954    Years since quitting: 64.7  . Smokeless tobacco: Never Used  . Tobacco comment: tobacco use - no  Substance Use Topics  . Alcohol use: No    Alcohol/week: 0.0 standard drinks    Allergies  Allergen Reactions  . Flexeril [Cyclobenzaprine Hcl] Nausea And Vomiting  . Percocet [Oxycodone-Acetaminophen] Nausea And Vomiting  . Vicodin [Hydrocodone-Acetaminophen] Nausea And Vomiting  . Sulfonamide Derivatives Rash    Current Outpatient Medications  Medication Sig Dispense Refill  . alfuzosin (UROXATRAL) 10 MG 24 hr tablet Take 10 mg by mouth daily with breakfast.    . amLODipine (NORVASC) 2.5 MG tablet TAKE ONE TABLET BY MOUTH DAILY 90 tablet 3  . CALCIUM-MAGNESIUM-ZINC PO Take 1 tablet by mouth daily.    . Cholecalciferol (VITAMIN D) 400 UNITS capsule Take 400 Units by mouth daily.     . dorzolamide (TRUSOPT) 2 % ophthalmic solution Place 1 drop into the right eye 2 (two) times daily.     Marland Kitchen esomeprazole (NEXIUM) 20 MG capsule Take 20 mg by mouth daily.    . finasteride (PROSCAR) 5 MG tablet Take  5 mg by mouth daily.      . furosemide (LASIX) 40 MG tablet Take 1 tablet (40 mg total) by mouth daily. 90 tablet 1  . latanoprost (XALATAN) 0.005 % ophthalmic solution Place 1 drop into both eyes at bedtime.     . metoprolol tartrate (LOPRESSOR) 25 MG tablet Take 0.5 tablets (12.5 mg total) by mouth 2 (two) times daily. 90 tablet 1  . NON FORMULARY Apply 1 application topically daily as needed (pain). CBD Foot Cream    . Omega-3 Fatty Acids (FISH OIL) 1200 MG CAPS Take 1,200 mg by mouth daily.     . potassium chloride SA (K-DUR) 20 MEQ tablet Take 1 tablet (20 mEq total) by mouth daily. 90 tablet 1  . rivaroxaban (XARELTO) 20 MG TABS tablet Take 1 tablet (20 mg total) by mouth daily with supper. Resume on 01/20/2018 30 tablet 6  . UNABLE TO FIND at bedtime.  On CPAP     No current facility-administered medications for this visit.     REVIEW OF SYSTEMS:  [X]  denotes positive finding, [ ]  denotes negative finding Cardiac  Comments:  Chest pain or chest pressure:    Shortness of breath upon exertion: x   Short of breath when lying flat:    Irregular heart rhythm:        Vascular    Pain in calf, thigh, or hip brought on by ambulation:    Pain in feet at night that wakes you up from your sleep:     Blood clot in your veins:    Leg swelling:  x       Pulmonary    Oxygen at home:    Productive cough:     Wheezing:         Neurologic    Sudden weakness in arms or legs:     Sudden numbness in arms or legs:     Sudden onset of difficulty speaking or slurred speech:    Temporary loss of vision in one eye:     Problems with dizziness:         Gastrointestinal    Blood in stool:     Vomited blood:         Genitourinary    Burning when urinating:     Blood in urine:        Psychiatric    Major depression:         Hematologic    Bleeding problems:    Problems with blood clotting too easily:        Skin    Rashes or ulcers:        Constitutional    Fever or chills:     PHYSICAL EXAM:   Vitals:   10/21/18 0908  BP: 115/74  Pulse: 60  Resp: 18  Temp: (!) 97.5 F (36.4 C)  TempSrc: Temporal  SpO2: 96%  Weight: 182 lb (82.6 kg)  Height: 5\' 8"  (1.727 m)   Body mass index is 27.67 kg/m.   GENERAL: The patient is a well-nourished male, in no acute distress. The vital signs are documented above. CARDIAC: There is a regular rate and rhythm.  VASCULAR: I do not detect carotid bruits. On the right side he has a normal femoral pulse.  I cannot palpate pedal pulses although the right foot is warm and well-perfused.  He has moderate right lower extremity swelling. On the left side he has a palpable femoral and dorsalis pedis pulse.  He has moderate  left lower extremity swelling. PULMONARY: There is good air exchange  bilaterally without wheezing or rales. ABDOMEN: Soft and non-tender with normal pitched bowel sounds.  I cannot palpate his aneurysm. MUSCULOSKELETAL: There are no major deformities or cyanosis. NEUROLOGIC: No focal weakness or paresthesias are detected. SKIN: There are no ulcers or rashes noted. PSYCHIATRIC: The patient has a normal affect.  DATA:    DUPLEX ABDOMINAL AORTA: I have independently interpreted his duplex of the abdominal aorta today.  The maximum diameter is noted to be 5.47 cm.  The right common iliac artery measures 1.3 cm in maximum diameter.  The left common iliac artery measures 1.3 cm in maximum diameter.  MEDICAL ISSUES:   ABDOMINAL AORTIC ANEURYSM: His abdominal aortic aneurysm has enlarged to 5.5 cm.  I explained that in a normal risk patient we would consider elective repair at 5.5 cm.  However given that he is 89 I think that he is at increased risk for intervention.  He is opposed to any aggressive approach to his aneurysm given his age and feels comfortable with that decision.  I have recommended a CT angiogram in 6 months.  This test has been ordered.  If the aneurysm continues to enlarge and he was a good candidate for endovascular approach then I think it would be reasonable to proceed with repair of his aneurysm.  If he would require open repair he would certainly be at increased risk although he is of reasonable shape for his age.  However he is very much opposed to any major surgery given his age.  He is very comfortable with this decision.  Deitra Mayo [facility] [phone]

## 2018-10-28 DIAGNOSIS — E782 Mixed hyperlipidemia: Secondary | ICD-10-CM | POA: Diagnosis not present

## 2018-10-28 DIAGNOSIS — I10 Essential (primary) hypertension: Secondary | ICD-10-CM | POA: Diagnosis not present

## 2018-10-28 DIAGNOSIS — I480 Paroxysmal atrial fibrillation: Secondary | ICD-10-CM | POA: Diagnosis not present

## 2018-10-30 ENCOUNTER — Other Ambulatory Visit: Payer: Self-pay

## 2018-10-30 ENCOUNTER — Ambulatory Visit (INDEPENDENT_AMBULATORY_CARE_PROVIDER_SITE_OTHER): Payer: Medicare Other | Admitting: Urology

## 2018-10-30 DIAGNOSIS — R1031 Right lower quadrant pain: Secondary | ICD-10-CM | POA: Diagnosis not present

## 2018-10-30 DIAGNOSIS — Z87442 Personal history of urinary calculi: Secondary | ICD-10-CM

## 2018-10-30 DIAGNOSIS — R351 Nocturia: Secondary | ICD-10-CM

## 2018-10-30 DIAGNOSIS — N401 Enlarged prostate with lower urinary tract symptoms: Secondary | ICD-10-CM | POA: Diagnosis not present

## 2018-11-02 DIAGNOSIS — M79671 Pain in right foot: Secondary | ICD-10-CM | POA: Diagnosis not present

## 2018-11-02 DIAGNOSIS — M7661 Achilles tendinitis, right leg: Secondary | ICD-10-CM | POA: Diagnosis not present

## 2018-11-04 DIAGNOSIS — M79671 Pain in right foot: Secondary | ICD-10-CM | POA: Diagnosis not present

## 2018-11-04 DIAGNOSIS — M79672 Pain in left foot: Secondary | ICD-10-CM | POA: Diagnosis not present

## 2018-11-04 DIAGNOSIS — L110 Acquired keratosis follicularis: Secondary | ICD-10-CM | POA: Diagnosis not present

## 2018-11-04 DIAGNOSIS — I739 Peripheral vascular disease, unspecified: Secondary | ICD-10-CM | POA: Diagnosis not present

## 2018-11-06 DIAGNOSIS — H524 Presbyopia: Secondary | ICD-10-CM | POA: Diagnosis not present

## 2018-11-06 DIAGNOSIS — H401122 Primary open-angle glaucoma, left eye, moderate stage: Secondary | ICD-10-CM | POA: Diagnosis not present

## 2018-11-06 DIAGNOSIS — H401113 Primary open-angle glaucoma, right eye, severe stage: Secondary | ICD-10-CM | POA: Diagnosis not present

## 2018-11-06 DIAGNOSIS — H0100A Unspecified blepharitis right eye, upper and lower eyelids: Secondary | ICD-10-CM | POA: Diagnosis not present

## 2018-11-20 ENCOUNTER — Other Ambulatory Visit: Payer: Self-pay | Admitting: Cardiovascular Disease

## 2018-11-20 ENCOUNTER — Encounter: Payer: Self-pay | Admitting: Cardiovascular Disease

## 2018-11-20 ENCOUNTER — Telehealth: Payer: Self-pay | Admitting: Cardiovascular Disease

## 2018-11-20 ENCOUNTER — Encounter: Payer: Self-pay | Admitting: *Deleted

## 2018-11-20 ENCOUNTER — Other Ambulatory Visit: Payer: Self-pay

## 2018-11-20 ENCOUNTER — Ambulatory Visit (INDEPENDENT_AMBULATORY_CARE_PROVIDER_SITE_OTHER): Payer: Medicare Other | Admitting: Cardiovascular Disease

## 2018-11-20 VITALS — BP 116/78 | HR 63 | Ht 68.0 in | Wt 184.0 lb

## 2018-11-20 DIAGNOSIS — I714 Abdominal aortic aneurysm, without rupture, unspecified: Secondary | ICD-10-CM

## 2018-11-20 DIAGNOSIS — Z01812 Encounter for preprocedural laboratory examination: Secondary | ICD-10-CM | POA: Diagnosis not present

## 2018-11-20 DIAGNOSIS — R5383 Other fatigue: Secondary | ICD-10-CM | POA: Diagnosis not present

## 2018-11-20 DIAGNOSIS — R0600 Dyspnea, unspecified: Secondary | ICD-10-CM | POA: Diagnosis not present

## 2018-11-20 DIAGNOSIS — I480 Paroxysmal atrial fibrillation: Secondary | ICD-10-CM | POA: Diagnosis not present

## 2018-11-20 DIAGNOSIS — R0609 Other forms of dyspnea: Secondary | ICD-10-CM

## 2018-11-20 DIAGNOSIS — I495 Sick sinus syndrome: Secondary | ICD-10-CM | POA: Diagnosis not present

## 2018-11-20 DIAGNOSIS — Z950 Presence of cardiac pacemaker: Secondary | ICD-10-CM | POA: Diagnosis not present

## 2018-11-20 DIAGNOSIS — I10 Essential (primary) hypertension: Secondary | ICD-10-CM | POA: Diagnosis not present

## 2018-11-20 DIAGNOSIS — R600 Localized edema: Secondary | ICD-10-CM | POA: Diagnosis not present

## 2018-11-20 MED ORDER — SODIUM CHLORIDE 0.9% FLUSH: 3.0000 mL | Freq: Two times a day (BID) | INTRAVENOUS | Status: AC

## 2018-11-20 NOTE — Patient Instructions (Signed)
Medication Instructions:  Continue all current medications.  Labwork: BMET, CBC - orders given today.   Testing/Procedures: Your physician has requested that you have a cardiac catheterization. Cardiac catheterization is used to diagnose and/or treat various heart conditions. Doctors may recommend this procedure for a number of different reasons. The most common reason is to evaluate chest pain. Chest pain can be a symptom of coronary artery disease (CAD), and cardiac catheterization can show whether plaque is narrowing or blocking your heart's arteries. This procedure is also used to evaluate the valves, as well as measure the blood flow and oxygen levels in different parts of your heart. For further information please visit [email or web address]. Please follow instruction sheet, as given.  Follow-Up: 2 months   Any Other Special Instructions Will Be Listed Below (If Applicable).  If you need a refill on your cardiac medications before your next appointment, please call your pharmacy.

## 2018-11-20 NOTE — Telephone Encounter (Signed)
Pre-cert Verification for the following procedure    right & left heart cath - 10/30 at 7:30 - [facility]

## 2018-11-20 NOTE — Progress Notes (Signed)
SUBJECTIVE: The patient presents for follow-up of exertional fatigue.  I increased Lasix to 40 mg daily on 09/15/2018.  I checked a BNP and it was normal.  Echocardiogram on 10/01/2018 demonstrated normal LV systolic function, EF 60 to 123456, grade 1 diastolic dysfunction, and moderate left atrial dilatation.  He is continuing to experience exertional dyspnea when walking to the mailbox or walking his dog.  He denies exertional chest pain and tightness.  His symptoms did not improve after getting a pacemaker.  He denies orthopnea and proximal nocturnal dyspnea.  He has chronic bilateral leg swelling.     Review of Systems: As per "subjective", otherwise negative.  Allergies  Allergen Reactions  . Flexeril [Cyclobenzaprine Hcl] Nausea And Vomiting  . Percocet [Oxycodone-Acetaminophen] Nausea And Vomiting  . Vicodin [Hydrocodone-Acetaminophen] Nausea And Vomiting  . Sulfonamide Derivatives Rash    Current Outpatient Medications  Medication Sig Dispense Refill  . alfuzosin (UROXATRAL) 10 MG 24 hr tablet Take 10 mg by mouth daily with breakfast.    . amLODipine (NORVASC) 2.5 MG tablet TAKE ONE TABLET BY MOUTH DAILY 90 tablet 3  . CALCIUM-MAGNESIUM-ZINC PO Take 1 tablet by mouth daily.    . Cholecalciferol (VITAMIN D) 400 UNITS capsule Take 400 Units by mouth daily.     . dorzolamide (TRUSOPT) 2 % ophthalmic solution Place 1 drop into the right eye 2 (two) times daily.     Marland Kitchen esomeprazole (NEXIUM) 20 MG capsule Take 20 mg by mouth daily.    . finasteride (PROSCAR) 5 MG tablet Take 5 mg by mouth daily.      . furosemide (LASIX) 40 MG tablet Take 1 tablet (40 mg total) by mouth daily. 90 tablet 1  . latanoprost (XALATAN) 0.005 % ophthalmic solution Place 1 drop into both eyes at bedtime.     . NON FORMULARY Apply 1 application topically daily as needed (pain). CBD Foot Cream    . Omega-3 Fatty Acids (FISH OIL) 1200 MG CAPS Take 1,200 mg by mouth daily.     . potassium chloride SA  (K-DUR) 20 MEQ tablet Take 1 tablet (20 mEq total) by mouth daily. 90 tablet 1  . rivaroxaban (XARELTO) 20 MG TABS tablet Take 1 tablet (20 mg total) by mouth daily with supper. Resume on 01/20/2018 30 tablet 6  . UNABLE TO FIND at bedtime. On CPAP    . metoprolol tartrate (LOPRESSOR) 25 MG tablet Take 0.5 tablets (12.5 mg total) by mouth 2 (two) times daily. 90 tablet 1   No current facility-administered medications for this visit.     Past Medical History:  Diagnosis Date  . AAA (abdominal aortic aneurysm) ([place])   . Atrial fibrillation (HCC)    bicarbonate perfusion study 10/11 EF 69%. small defects no ischemia. 7 metastases acheived. ER visit 11/10/09 ruled out MI normal sinus rhythm orthostasis after sublingual nitroglycerin   . BPH (benign prostatic hyperplasia)   . Chronic back pain   . GERD (gastroesophageal reflux disease)   . Hypertension   . Orthostatic hypotension   . PONV (postoperative nausea and vomiting)   . Sleep apnea    CPAP at night  . Stress fracture of foot    left  . Swelling of both lower extremities     Past Surgical History:  Procedure Laterality Date  . CHOLECYSTECTOMY  2012   [facility] by Dr. Geroge Baseman  . COLONOSCOPY N/A 08/27/2012   Procedure: COLONOSCOPY;  Surgeon: Rogene Houston, MD;  [facility];  Service: Endoscopy;  Laterality: N/A;  325  . COLONOSCOPY N/A 01/12/2018   Procedure: COLONOSCOPY;  Surgeon: Rogene Houston, MD;  Location[facility];  Service: Endoscopy;  Laterality: N/A;  Has staff Christmas party at his house at lunch today per office  . HERNIA REPAIR Bilateral    Inguinal and umbilical  . INCISIONAL HERNIA REPAIR N/A 01/31/2014   Procedure: Fatima Blank HERNIORRHAPHY WITH MESH;  Surgeon: Jamesetta So, MD;  Location: [facility] ORS;  Service: General;  Laterality: N/A;  . INSERTION OF MESH N/A 01/31/2014   Procedure: INSERTION OF MESH;  Surgeon: Jamesetta So, MD;  Location: [facility] ORS;  Service: General;  Laterality: N/A;  .  PACEMAKER IMPLANT N/A 12/02/2017   St Jude Medical Assurity MRI conditional  dual-chamber pacemaker by Dr Rayann Heman for symptomatic sinus bradycardia  . POLYPECTOMY  01/12/2018   Procedure: POLYPECTOMY;  Surgeon: Rogene Houston, MD;  Location: [facility];  Service: Endoscopy;;  transverse colon polyp, ascending colon polyps x2, hepatic flexure polyps x2   . SHOULDER SURGERY Right 2005?   for open rotator cuff repair. [place]  . WOUND DEBRIDEMENT Right 01/31/2014   Procedure: DEBRIDEMENT WOUND RIGHT LEG;  Surgeon: Jamesetta So, MD;  Location: [facility] ORS;  Service: General;  Laterality: Right;  . WRIST FRACTURE SURGERY Right     Social History   Socioeconomic History  . Marital status: Divorced    Spouse name: Not on file  . Number of children: Not on file  . Years of education: Not on file  . Highest education level: Not on file  Occupational History  . Not on file  Social Needs  . Financial resource strain: Not on file  . Food insecurity    Worry: Not on file    Inability: Not on file  . Transportation needs    Medical: Not on file    Non-medical: Not on file  Tobacco Use  . Smoking status: Former Smoker    Packs/day: 1.00    Years: 10.00    Pack years: 10.00    Types: Cigarettes    Start date: 10/15/1943    Quit date: 01/28/1954    Years since quitting: 64.8  . Smokeless tobacco: Never Used  . Tobacco comment: tobacco use - no  Substance and Sexual Activity  . Alcohol use: No    Alcohol/week: 0.0 standard drinks  . Drug use: No  . Sexual activity: Not on file  Lifestyle  . Physical activity    Days per week: Not on file    Minutes per session: Not on file  . Stress: Not on file  Relationships  . Social Herbalist on phone: Not on file    Gets together: Not on file    Attends religious service: Not on file    Active member of club or organization: Not on file    Attends meetings of clubs or organizations: Not on file    Relationship status:  Not on file  . Intimate partner violence    Fear of current or ex partner: Not on file    Emotionally abused: Not on file    Physically abused: Not on file    Forced sexual activity: Not on file  Other Topics Concern  . Not on file  Social History Narrative  . Not on file     Vitals:   11/20/18 1402  BP: 116/78  Pulse: 63  SpO2: 96%  Weight: 184 lb (83.5 kg)  Height: 5\' 8"  (1.727 m)    Wt Readings from Last 3 Encounters:  11/20/18 184 lb (83.5 kg)  10/21/18 182 lb (82.6 kg)  09/15/18 187 lb 6.4 oz (85 kg)     PHYSICAL EXAM General: NAD HEENT: Normal. Neck: No JVD, no thyromegaly. Lungs: Clear to auscultation bilaterally with normal respiratory effort. CV: Regular rate and rhythm, normal S1/S2, no S3/S4, no murmur.  Chronic trace bilateral lower extremity edema. Abdomen: Soft, nontender, no distention.  Neurologic: Alert and oriented.  Psych: Normal affect. Skin: Normal. Musculoskeletal: No gross deformities.      Labs: Lab Results  Component Value Date/Time   K 3.9 01/11/2018 07:02 AM   BUN 12 01/11/2018 07:02 AM   BUN 10 11/17/2017 11:55 AM   CREATININE 0.89 01/11/2018 07:02 AM   CREATININE 0.91 08/12/2014 08:25 AM   ALT 9 01/11/2018 07:02 AM   HGB 10.7 (L) 01/13/2018 05:04 AM   HGB 13.7 11/17/2017 11:55 AM     Lipids: No results found for: LDLCALC, LDLDIRECT, CHOL, TRIG, HDL     ASSESSMENT AND PLAN: 1. Symptomatic bradycardia: Status post implantation of St. Jude pacemaker in November 2019. Followed by Dr. Rayann Heman.Device interrogation on 08/28/2018 was normal.  I have already arranged for earlier follow-up with EP.  2. Paroxysmal atrial fibrillation:Symptomatically stable.  Continue Lopressor 12.5 mg twice daily. Anticoagulated with Xarelto.  3. Hypertension: Controlled.No changes to therapy.  4. Abdominal aortic aneurysm: 5.5 cm on 10/21/2018. Followed by vascular surgery.  5. Lower extremity edema: Likely secondary to venous  insufficiency. Compression stockings are recommended.    Continue Lasix 40 mg daily and potassium chloride 20 mEq daily.  6. Exertional fatigue: Hemoglobin only 12.1.  Pacemaker function is normal.  He said his symptoms have not improved after getting a pacemaker.  Left ventricular systolic function is normal.  BNP is normal.  I will arrange for right and left heart cardiac catheterization/coronary angiography.    Disposition: Follow up 2 months  A high level of decision making was required for increased medical complexities.    Kate Sable, M.D., F.A.C.C.

## 2018-11-23 ENCOUNTER — Telehealth: Payer: Self-pay | Admitting: *Deleted

## 2018-11-23 NOTE — Telephone Encounter (Signed)
Patient calling - has cath scheduled for 11/27/18.  Stated that he has done a lot of thinking and soul searching & has decided that he does not want to have the cath now.  Stated that he may decide to do this later at some point.  Explained to patient your reasoning for the cath (not feeling any better after getting pacemaker).  Do you want him to keep the 2 month follow up or push out since no longer doing the cath?

## 2018-11-23 NOTE — Telephone Encounter (Signed)
Push out to early Feb. Let him see Dr. Rayann Heman first.

## 2018-11-24 NOTE — Telephone Encounter (Signed)
Patient notified.  Appointment cancelled for December with Dr. Bronson Ing.  New recall has been put in for February (4 months).

## 2018-11-24 NOTE — Telephone Encounter (Signed)
Cath has been cancelled.   Left message to return call.

## 2018-11-27 ENCOUNTER — Ambulatory Visit (HOSPITAL_COMMUNITY)
Admission: RE | Admit: 2018-11-27 | Payer: Medicare Other | Source: Home / Self Care | Admitting: Interventional Cardiology

## 2018-11-27 ENCOUNTER — Encounter (HOSPITAL_COMMUNITY): Admission: RE | Payer: Self-pay | Source: Home / Self Care

## 2018-11-27 ENCOUNTER — Ambulatory Visit (INDEPENDENT_AMBULATORY_CARE_PROVIDER_SITE_OTHER): Payer: Medicare Other | Admitting: *Deleted

## 2018-11-27 DIAGNOSIS — I495 Sick sinus syndrome: Secondary | ICD-10-CM

## 2018-11-27 DIAGNOSIS — I10 Essential (primary) hypertension: Secondary | ICD-10-CM | POA: Diagnosis not present

## 2018-11-27 DIAGNOSIS — E782 Mixed hyperlipidemia: Secondary | ICD-10-CM | POA: Diagnosis not present

## 2018-11-27 DIAGNOSIS — I4891 Unspecified atrial fibrillation: Secondary | ICD-10-CM

## 2018-11-27 LAB — CUP PACEART REMOTE DEVICE CHECK
Battery Remaining Longevity: 107 mo
Battery Remaining Percentage: 95.5 %
Battery Voltage: 3.02 V
Brady Statistic AP VP Percent: 1 %
Brady Statistic AP VS Percent: 97 %
Brady Statistic AS VP Percent: 1 %
Brady Statistic AS VS Percent: 3.1 %
Brady Statistic RA Percent Paced: 96 %
Brady Statistic RV Percent Paced: 1 %
Date Time Interrogation Session: 20201030060016
Implantable Lead Implant Date: 20191105
Implantable Lead Implant Date: 20191105
Implantable Lead Location: 753859
Implantable Lead Location: 753860
Implantable Pulse Generator Implant Date: 20191105
Lead Channel Impedance Value: 450 Ohm
Lead Channel Impedance Value: 550 Ohm
Lead Channel Pacing Threshold Amplitude: 0.5 V
Lead Channel Pacing Threshold Amplitude: 0.5 V
Lead Channel Pacing Threshold Pulse Width: 0.5 ms
Lead Channel Pacing Threshold Pulse Width: 0.5 ms
Lead Channel Sensing Intrinsic Amplitude: 2.7 mV
Lead Channel Sensing Intrinsic Amplitude: 9.1 mV
Lead Channel Setting Pacing Amplitude: 2 V
Lead Channel Setting Pacing Amplitude: 2.5 V
Lead Channel Setting Pacing Pulse Width: 0.5 ms
Lead Channel Setting Sensing Sensitivity: 2 mV
Pulse Gen Model: 2272
Pulse Gen Serial Number: 9079121

## 2018-11-27 SURGERY — RIGHT/LEFT HEART CATH AND CORONARY ANGIOGRAPHY
Anesthesia: LOCAL

## 2018-12-01 DIAGNOSIS — Z23 Encounter for immunization: Secondary | ICD-10-CM | POA: Diagnosis not present

## 2018-12-07 ENCOUNTER — Other Ambulatory Visit: Payer: Self-pay | Admitting: Nurse Practitioner

## 2018-12-09 NOTE — Progress Notes (Signed)
Remote pacemaker transmission.   

## 2018-12-18 ENCOUNTER — Telehealth: Payer: Self-pay | Admitting: Internal Medicine

## 2018-12-18 NOTE — Telephone Encounter (Signed)
Virtual Visit Pre-Appointment Phone Call  "(Name), I am calling you today to discuss your upcoming appointment. We are currently trying to limit exposure to the virus that causes COVID-19 by seeing patients at home rather than in the office."  1. "What is the BEST phone number to call the day of the visit?" - include this in appointment notes  2. Do you have or have access to (through a family member/friend) a smartphone with video capability that we can use for your visit?" a. If yes - list this number in appt notes as cell (if different from BEST phone #) and list the appointment type as a VIDEO visit in appointment notes b. If no - list the appointment type as a PHONE visit in appointment notes  3. Confirm consent - "In the setting of the current Covid19 crisis, you are scheduled for a (phone or video) visit with your provider on (date) at (time).  Just as we do with many in-office visits, in order for you to participate in this visit, we must obtain consent.  If you'd like, I can send this to your mychart (if signed up) or email for you to review.  Otherwise, I can obtain your verbal consent now.  All virtual visits are billed to your insurance company just like a normal visit would be.  By agreeing to a virtual visit, we'd like you to understand that the technology does not allow for your provider to perform an examination, and thus may limit your provider's ability to fully assess your condition. If your provider identifies any concerns that need to be evaluated in person, we will make arrangements to do so.  Finally, though the technology is pretty good, we cannot assure that it will always work on either your or our end, and in the setting of a video visit, we may have to convert it to a phone-only visit.  In either situation, we cannot ensure that we have a secure connection.  Are you willing to proceed?" STAFF: Did the patient verbally acknowledge consent to telehealth visit? Document  YES/NO here: yes  4. Advise patient to be prepared - "Two hours prior to your appointment, go ahead and check your blood pressure, pulse, oxygen saturation, and your weight (if you have the equipment to check those) and write them all down. When your visit starts, your provider will ask you for this information. If you have an Apple Watch or Kardia device, please plan to have heart rate information ready on the day of your appointment. Please have a pen and paper handy nearby the day of the visit as well."  5. Give patient instructions for MyChart download to smartphone OR [email or web address] as below if video visit (depending on what platform provider is using)  6. Inform patient they will receive a phone call 15 minutes prior to their appointment time (may be from unknown caller ID) so they should be prepared to answer    TELEPHONE CALL NOTE  [name] has been deemed a candidate for a follow-up tele-health visit to limit community exposure during the Covid-19 pandemic. I spoke with the patient via phone to ensure availability of phone/video source, confirm preferred email & phone number, and discuss instructions and expectations.  I reminded [name] to be prepared with any vital sign and/or heart rhythm information that could potentially be obtained via home monitoring, at the time of his visit. I reminded [name] to expect a phone call prior to  his visit.  [name] 12/18/2018 11:25 AM   INSTRUCTIONS FOR DOWNLOADING THE MYCHART APP TO SMARTPHONE  - The patient must first make sure to have activated MyChart and know their login information - If Apple, go to [employer] and type in MyChart in the search bar and download the app. If Android, ask patient to go to [employer] and type in [place] in the search bar and download the app. The app is free but as with any other app downloads, their phone may require them to verify saved payment information or  Apple/Android password.  - The patient will need to then log into the app with their MyChart username and password, and select [facility] as their healthcare provider to link the account. When it is time for your visit, go to the MyChart app, find appointments, and click Begin Video Visit. Be sure to Select Allow for your device to access the Microphone and Camera for your visit. You will then be connected, and your provider will be with you shortly.  **If they have any issues connecting, or need assistance please contact MyChart service desk [phone])**  **If using a computer, in order to ensure the best quality for their visit they will need to use either of the following Internet Browsers: [employer], or Google Chrome**  IF USING DOXIMITY or DOXY.ME - The patient will receive a link just prior to their visit by text.     FULL LENGTH CONSENT FOR TELE-HEALTH VISIT   I hereby voluntarily request, consent and authorize [facility] and its employed or contracted physicians, physician assistants, nurse practitioners or other licensed health care professionals (the Practitioner), to provide me with telemedicine health care services (the Services") as deemed necessary by the treating Practitioner. I acknowledge and consent to receive the Services by the Practitioner via telemedicine. I understand that the telemedicine visit will involve communicating with the Practitioner through live audiovisual communication technology and the disclosure of certain medical information by electronic transmission. I acknowledge that I have been given the opportunity to request an in-person assessment or other available alternative prior to the telemedicine visit and am voluntarily participating in the telemedicine visit.  I understand that I have the right to withhold or withdraw my consent to the use of telemedicine in the course of my care at any time, without affecting my right to future care  or treatment, and that the Practitioner or I may terminate the telemedicine visit at any time. I understand that I have the right to inspect all information obtained and/or recorded in the course of the telemedicine visit and may receive copies of available information for a reasonable fee.  I understand that some of the potential risks of receiving the Services via telemedicine include:   Delay or interruption in medical evaluation due to technological equipment failure or disruption;  Information transmitted may not be sufficient (e.g. poor resolution of images) to allow for appropriate medical decision making by the Practitioner; and/or   In rare instances, security protocols could fail, causing a breach of personal health information.  Furthermore, I acknowledge that it is my responsibility to provide information about my medical history, conditions and care that is complete and accurate to the best of my ability. I acknowledge that Practitioner's advice, recommendations, and/or decision may be based on factors not within their control, such as incomplete or inaccurate data provided by me or distortions of diagnostic images or specimens that may result from electronic transmissions. I  understand that the practice of medicine is not an exact science and that Practitioner makes no warranties or guarantees regarding treatment outcomes. I acknowledge that I will receive a copy of this consent concurrently upon execution via email to the email address I last provided but may also request a printed copy by calling the office of [facility].    I understand that my insurance will be billed for this visit.   I have read or had this consent read to me.  I understand the contents of this consent, which adequately explains the benefits and risks of the Services being provided via telemedicine.   I have been provided ample opportunity to ask questions regarding this consent and the Services and have had  my questions answered to my satisfaction.  I give my informed consent for the services to be provided through the use of telemedicine in my medical care  By participating in this telemedicine visit I agree to the above.

## 2018-12-23 ENCOUNTER — Telehealth (INDEPENDENT_AMBULATORY_CARE_PROVIDER_SITE_OTHER): Payer: Medicare Other | Admitting: Internal Medicine

## 2018-12-23 ENCOUNTER — Encounter: Payer: Self-pay | Admitting: Internal Medicine

## 2018-12-23 VITALS — BP 144/84 | HR 60 | Ht 68.0 in | Wt 182.0 lb

## 2018-12-23 DIAGNOSIS — I10 Essential (primary) hypertension: Secondary | ICD-10-CM | POA: Diagnosis not present

## 2018-12-23 DIAGNOSIS — I480 Paroxysmal atrial fibrillation: Secondary | ICD-10-CM

## 2018-12-23 DIAGNOSIS — I495 Sick sinus syndrome: Secondary | ICD-10-CM

## 2018-12-23 NOTE — Progress Notes (Signed)
Electrophysiology TeleHealth Note   Due to national recommendations of social distancing due to [place] 19, an audio telehealth visit is felt to be most appropriate for this patient at this time.  Verbal consent was obtained by me for the telehealth visit today.  The patient does not have capability for a virtual visit.  A phone visit is therefore required today.   Date:  12/23/2018   ID:  [name], DOB [birth year], MRN [id number]  Location: patient's home  Provider location:  [facility]  Evaluation Performed: Follow-up visit  PCP:  Curlene Labrum, MD   Electrophysiologist:  Dr Rayann Heman  Chief Complaint:  Pacemaker follow up  History of Present Illness:    [name] is a 85 y.o. male who presents via telehealth conferencing today.  Since last being seen in our clinic, the patient reports doing very well.  Today, he denies symptoms of palpitations, chest pain, lower extremity edema, dizziness, presyncope, or syncope.  He has stable SOB.  The patient is otherwise without complaint today.    Past Medical History:  Diagnosis Date  . AAA (abdominal aortic aneurysm) ([place])   . Atrial fibrillation (HCC)    bicarbonate perfusion study 10/11 EF 69%. small defects no ischemia. 7 metastases acheived. ER visit 11/10/09 ruled out MI normal sinus rhythm orthostasis after sublingual nitroglycerin   . BPH (benign prostatic hyperplasia)   . Chronic back pain   . GERD (gastroesophageal reflux disease)   . Hypertension   . Orthostatic hypotension   . PONV (postoperative nausea and vomiting)   . Sleep apnea    CPAP at night  . Stress fracture of foot    left  . Swelling of both lower extremities     Past Surgical History:  Procedure Laterality Date  . CHOLECYSTECTOMY  2012   [facility] by Dr. Geroge Baseman  . COLONOSCOPY N/A 08/27/2012   Procedure: COLONOSCOPY;  Surgeon: Rogene Houston, MD;  [facility];  Service: Endoscopy;  Laterality: N/A;  325  .  COLONOSCOPY N/A 01/12/2018   Procedure: COLONOSCOPY;  Surgeon: Rogene Houston, MD;  [facility];  Service: Endoscopy;  Laterality: N/A;  Has staff Christmas party at his house at lunch today per office  . HERNIA REPAIR Bilateral    Inguinal and umbilical  . INCISIONAL HERNIA REPAIR N/A 01/31/2014   Procedure: Fatima Blank HERNIORRHAPHY WITH MESH;  Surgeon: Jamesetta So, MD;  Location: [facility] ORS;  Service: General;  Laterality: N/A;  . INSERTION OF MESH N/A 01/31/2014   Procedure: INSERTION OF MESH;  Surgeon: Jamesetta So, MD;  Location: [facility] ORS;  Service: General;  Laterality: N/A;  . PACEMAKER IMPLANT N/A 12/02/2017   St Jude Medical Assurity MRI conditional  dual-chamber pacemaker by Dr Rayann Heman for symptomatic sinus bradycardia  . POLYPECTOMY  01/12/2018   Procedure: POLYPECTOMY;  Surgeon: Rogene Houston, MD;  [facility];  Service: Endoscopy;;  transverse colon polyp, ascending colon polyps x2, hepatic flexure polyps x2   . SHOULDER SURGERY Right 2005?   for open rotator cuff repair. [place]  . WOUND DEBRIDEMENT Right 01/31/2014   Procedure: DEBRIDEMENT WOUND RIGHT LEG;  Surgeon: Jamesetta So, MD;  Location: [facility] ORS;  Service: General;  Laterality: Right;  . WRIST FRACTURE SURGERY Right     Current Outpatient Medications  Medication Sig Dispense Refill  . alfuzosin (UROXATRAL) 10 MG 24 hr tablet Take 10 mg by mouth daily with  breakfast.    . amLODipine (NORVASC) 2.5 MG tablet TAKE ONE TABLET BY MOUTH DAILY (Patient taking differently: Take 2.5 mg by mouth daily. ) 90 tablet 3  . CALCIUM-MAGNESIUM-ZINC PO Take 1 tablet by mouth daily.    . cholecalciferol (VITAMIN D3) 25 MCG (1000 UT) tablet Take 1,000 Units by mouth daily.    . dorzolamide (TRUSOPT) 2 % ophthalmic solution Place 1 drop into the right eye 2 (two) times daily.     Marland Kitchen esomeprazole (NEXIUM) 20 MG capsule Take 20 mg by mouth daily.    . finasteride (PROSCAR) 5 MG tablet Take 5 mg by mouth  daily.      . furosemide (LASIX) 40 MG tablet Take 1 tablet (40 mg total) by mouth daily. 90 tablet 1  . latanoprost (XALATAN) 0.005 % ophthalmic solution Place 1 drop into both eyes at bedtime.     . metoprolol tartrate (LOPRESSOR) 25 MG tablet Take 0.5 tablets (12.5 mg total) by mouth 2 (two) times daily. 90 tablet 1  . Omega-3 Fatty Acids (OMEGA-3 2100 PO) Take 2,100 mg by mouth daily.    . potassium chloride SA (K-DUR) 20 MEQ tablet Take 1 tablet (20 mEq total) by mouth daily. (Patient taking differently: Take 20 mEq by mouth at bedtime. ) 90 tablet 1  . UNABLE TO FIND at bedtime. On CPAP    . XARELTO 20 MG TABS tablet TAKE 1 TABLET BY MOUTH DAILY WITH SUPPER 30 tablet 6   Current Facility-Administered Medications  Medication Dose Route Frequency Provider Last Rate Last Dose  . sodium chloride flush (NS) 0.9 % injection 3 mL  3 mL Intravenous Q12H Herminio Commons, MD        Allergies:   Flexeril [cyclobenzaprine hcl], Percocet [oxycodone-acetaminophen], Vicodin [hydrocodone-acetaminophen], and Sulfonamide derivatives   Social History:  The patient  reports that he quit smoking about 64 years ago. His smoking use included cigarettes. He started smoking about 75 years ago. He has a 10.00 pack-year smoking history. He has never used smokeless tobacco. He reports that he does not drink alcohol or use drugs.   Family History:  The patient's  family history includes Cancer in his sister; Heart failure in his father and mother.   ROS:  Please see the history of present illness.   All other systems are personally reviewed and negative.    Exam:    Vital Signs:  BP (!) 144/84   Pulse 60   Ht 5\' 8"  (1.727 m)   Wt 182 lb (82.6 kg)   BMI 27.67 kg/m   Well sounding, alert and conversant, regular work of breathing   Labs/Other Tests and Data Reviewed:    Recent Labs: 01/11/2018: ALT 9; BUN 12; Creatinine, Ser 0.89; Potassium 3.9; Sodium 140 01/13/2018: Hemoglobin 10.7; Platelets 153    Wt Readings from Last 3 Encounters:  12/23/18 182 lb (82.6 kg)  11/20/18 184 lb (83.5 kg)  10/21/18 182 lb (82.6 kg)     Last device remote is reviewed from [place] PDF which reveals normal device function, no arrhythmias    ASSESSMENT & PLAN:    1.  Symptomatic sinus bradycardia Normal device function by recent remote See Pace[facility] report  2.  Paroxysmal atrial fibrillation Burden by last device interrogation <1% Continue Xarelto for CHADS2VASC of 3  3.  HTN Stable No change required today  4.  Shortness of breath/edema Chronic issue Recent echo reviewed  Followed by Dr Bronson Ing closely    Follow-up:  1 year with  me, remotes    Patient Risk:  after full review of this patients clinical status, I feel that they are at moderate risk at this time.  Today, I have spent 15 minutes with the patient with telehealth technology discussing arrhythmia management .    Army Fossa, MD  12/23/2018 10:15 AM     [facility] [address] [place] [address] [phone] (office) [phone] (fax)

## 2019-01-04 ENCOUNTER — Other Ambulatory Visit: Payer: Self-pay | Admitting: Internal Medicine

## 2019-01-19 ENCOUNTER — Ambulatory Visit: Payer: Medicare Other | Admitting: Cardiovascular Disease

## 2019-01-28 DIAGNOSIS — E782 Mixed hyperlipidemia: Secondary | ICD-10-CM | POA: Diagnosis not present

## 2019-01-28 DIAGNOSIS — I10 Essential (primary) hypertension: Secondary | ICD-10-CM | POA: Diagnosis not present

## 2019-02-03 ENCOUNTER — Other Ambulatory Visit: Payer: Self-pay | Admitting: Cardiovascular Disease

## 2019-02-12 ENCOUNTER — Other Ambulatory Visit: Payer: Self-pay | Admitting: Cardiovascular Disease

## 2019-02-25 DIAGNOSIS — Z23 Encounter for immunization: Secondary | ICD-10-CM | POA: Diagnosis not present

## 2019-03-04 ENCOUNTER — Other Ambulatory Visit: Payer: Self-pay | Admitting: Cardiovascular Disease

## 2019-03-05 ENCOUNTER — Encounter: Payer: Medicare Other | Admitting: Internal Medicine

## 2019-03-15 DIAGNOSIS — I10 Essential (primary) hypertension: Secondary | ICD-10-CM | POA: Diagnosis not present

## 2019-03-15 DIAGNOSIS — E782 Mixed hyperlipidemia: Secondary | ICD-10-CM | POA: Diagnosis not present

## 2019-03-15 DIAGNOSIS — K219 Gastro-esophageal reflux disease without esophagitis: Secondary | ICD-10-CM | POA: Diagnosis not present

## 2019-03-15 DIAGNOSIS — E871 Hypo-osmolality and hyponatremia: Secondary | ICD-10-CM | POA: Diagnosis not present

## 2019-03-15 DIAGNOSIS — I5032 Chronic diastolic (congestive) heart failure: Secondary | ICD-10-CM | POA: Diagnosis not present

## 2019-03-15 DIAGNOSIS — R5382 Chronic fatigue, unspecified: Secondary | ICD-10-CM | POA: Diagnosis not present

## 2019-03-15 DIAGNOSIS — G4733 Obstructive sleep apnea (adult) (pediatric): Secondary | ICD-10-CM | POA: Diagnosis not present

## 2019-03-15 DIAGNOSIS — Z950 Presence of cardiac pacemaker: Secondary | ICD-10-CM | POA: Diagnosis not present

## 2019-03-15 DIAGNOSIS — E559 Vitamin D deficiency, unspecified: Secondary | ICD-10-CM | POA: Diagnosis not present

## 2019-03-18 DIAGNOSIS — I480 Paroxysmal atrial fibrillation: Secondary | ICD-10-CM | POA: Diagnosis not present

## 2019-03-18 DIAGNOSIS — I495 Sick sinus syndrome: Secondary | ICD-10-CM | POA: Diagnosis not present

## 2019-03-18 DIAGNOSIS — I10 Essential (primary) hypertension: Secondary | ICD-10-CM | POA: Diagnosis not present

## 2019-03-18 DIAGNOSIS — E782 Mixed hyperlipidemia: Secondary | ICD-10-CM | POA: Diagnosis not present

## 2019-03-18 DIAGNOSIS — I5032 Chronic diastolic (congestive) heart failure: Secondary | ICD-10-CM | POA: Diagnosis not present

## 2019-03-18 DIAGNOSIS — Z950 Presence of cardiac pacemaker: Secondary | ICD-10-CM | POA: Diagnosis not present

## 2019-03-18 DIAGNOSIS — R944 Abnormal results of kidney function studies: Secondary | ICD-10-CM | POA: Diagnosis not present

## 2019-03-18 DIAGNOSIS — D369 Benign neoplasm, unspecified site: Secondary | ICD-10-CM | POA: Diagnosis not present

## 2019-03-20 DIAGNOSIS — Z23 Encounter for immunization: Secondary | ICD-10-CM | POA: Diagnosis not present

## 2019-03-30 ENCOUNTER — Ambulatory Visit (INDEPENDENT_AMBULATORY_CARE_PROVIDER_SITE_OTHER): Payer: Medicare Other | Admitting: Cardiovascular Disease

## 2019-03-30 ENCOUNTER — Encounter: Payer: Self-pay | Admitting: Cardiovascular Disease

## 2019-03-30 ENCOUNTER — Other Ambulatory Visit: Payer: Self-pay

## 2019-03-30 VITALS — BP 102/68 | HR 78 | Ht 68.0 in | Wt 187.2 lb

## 2019-03-30 DIAGNOSIS — I480 Paroxysmal atrial fibrillation: Secondary | ICD-10-CM | POA: Diagnosis not present

## 2019-03-30 DIAGNOSIS — I714 Abdominal aortic aneurysm, without rupture, unspecified: Secondary | ICD-10-CM

## 2019-03-30 DIAGNOSIS — R600 Localized edema: Secondary | ICD-10-CM

## 2019-03-30 DIAGNOSIS — Z950 Presence of cardiac pacemaker: Secondary | ICD-10-CM

## 2019-03-30 DIAGNOSIS — R0600 Dyspnea, unspecified: Secondary | ICD-10-CM | POA: Diagnosis not present

## 2019-03-30 DIAGNOSIS — I495 Sick sinus syndrome: Secondary | ICD-10-CM | POA: Diagnosis not present

## 2019-03-30 DIAGNOSIS — R5383 Other fatigue: Secondary | ICD-10-CM

## 2019-03-30 DIAGNOSIS — R0609 Other forms of dyspnea: Secondary | ICD-10-CM

## 2019-03-30 DIAGNOSIS — I10 Essential (primary) hypertension: Secondary | ICD-10-CM | POA: Diagnosis not present

## 2019-03-30 NOTE — Patient Instructions (Signed)
Medication Instructions:  Continue all current medications.   Labwork: none  Testing/Procedures: none  Follow-Up: 6 months   Any Other Special Instructions Will Be Listed Below (If Applicable).   If you need a refill on your cardiac medications before your next appointment, please call your pharmacy.  

## 2019-03-30 NOTE — Progress Notes (Signed)
SUBJECTIVE: The patient presents for follow-up of exertional dyspnea.  I previously arranged right left heart catheterization and coronary angiography in October 2020 but he had called our office back in deferred having this done.  Past medical history also includes paroxysmal atrial fibrillation and symptomatic rated cardia status post pacemaker placement in November 2019 for which she follows with Dr. Rayann Heman.  ECG performed today which I personally reviewed demonstrates sinus bradycardia, 59 bpm, left anterior fascicular block, and diffuse nonspecific ST segment and T wave abnormalities.  He denies exertional chest pain.  He continues to experience exertional dyspnea when walking to the mailbox or walking his dog.  Symptoms did not improve after getting a pacemaker.  His son from [place] is visiting.  He told me about his time in [place].  He lived there for 32 years.  Review of Systems: As per "subjective", otherwise negative.  Allergies  Allergen Reactions  . Flexeril [Cyclobenzaprine Hcl] Nausea And Vomiting  . Percocet [Oxycodone-Acetaminophen] Nausea And Vomiting  . Vicodin [Hydrocodone-Acetaminophen] Nausea And Vomiting  . Sulfonamide Derivatives Rash    Current Outpatient Medications  Medication Sig Dispense Refill  . alfuzosin (UROXATRAL) 10 MG 24 hr tablet Take 10 mg by mouth daily with breakfast.    . amLODipine (NORVASC) 2.5 MG tablet TAKE 1 TABLET BY MOUTH EVERY DAY 90 tablet 3  . CALCIUM-MAGNESIUM-ZINC PO Take 1 tablet by mouth daily.    . cholecalciferol (VITAMIN D3) 25 MCG (1000 UT) tablet Take 1,000 Units by mouth daily.    . dorzolamide (TRUSOPT) 2 % ophthalmic solution Place 1 drop into the right eye 2 (two) times daily.     Marland Kitchen esomeprazole (NEXIUM) 20 MG capsule Take 20 mg by mouth daily.    . finasteride (PROSCAR) 5 MG tablet Take 5 mg by mouth daily.      . furosemide (LASIX) 40 MG tablet TAKE 1 TABLET BY MOUTH EVERY DAY 90 tablet 1  . latanoprost  (XALATAN) 0.005 % ophthalmic solution Place 1 drop into both eyes at bedtime.     . metoprolol tartrate (LOPRESSOR) 25 MG tablet TAKE 1/2 TABLET BY MOUTH TWICE DAILY 90 tablet 1  . Omega-3 Fatty Acids (OMEGA-3 2100 PO) Take 2,100 mg by mouth daily.    . potassium chloride SA (KLOR-CON) 20 MEQ tablet TAKE 1 TABLET BY MOUTH EVERY DAY 90 tablet 2  . UNABLE TO FIND at bedtime. On CPAP    . XARELTO 20 MG TABS tablet TAKE 1 TABLET BY MOUTH DAILY WITH SUPPER 30 tablet 6   Current Facility-Administered Medications  Medication Dose Route Frequency Provider Last Rate Last Admin  . sodium chloride flush (NS) 0.9 % injection 3 mL  3 mL Intravenous Q12H Herminio Commons, MD        Past Medical History:  Diagnosis Date  . AAA (abdominal aortic aneurysm) ([place])   . Atrial fibrillation (HCC)    bicarbonate perfusion study 10/11 EF 69%. small defects no ischemia. 7 metastases acheived. ER visit 11/10/09 ruled out MI normal sinus rhythm orthostasis after sublingual nitroglycerin   . BPH (benign prostatic hyperplasia)   . Chronic back pain   . GERD (gastroesophageal reflux disease)   . Hypertension   . Orthostatic hypotension   . PONV (postoperative nausea and vomiting)   . Sleep apnea    CPAP at night  . Stress fracture of foot    left  . Swelling of both lower extremities     Past Surgical History:  Procedure Laterality Date  . CHOLECYSTECTOMY  2012   [facility] by Dr. Geroge Baseman  . COLONOSCOPY N/A 08/27/2012   Procedure: COLONOSCOPY;  Surgeon: Rogene Houston, MD;  [facility];  Service: Endoscopy;  Laterality: N/A;  325  . COLONOSCOPY N/A 01/12/2018   Procedure: COLONOSCOPY;  Surgeon: Rogene Houston, MD;  Location[facility];  Service: Endoscopy;  Laterality: N/A;  Has staff Christmas party at his house at lunch today per office  . HERNIA REPAIR Bilateral    Inguinal and umbilical  . INCISIONAL HERNIA REPAIR N/A 01/31/2014   Procedure: Fatima Blank HERNIORRHAPHY WITH MESH;   Surgeon: Jamesetta So, MD;  Location: [facility] OR[facility];  Service: General;  Laterality: N/A;  . INSERTION OF MESH N/A 01/31/2014   Procedure: INSERTION OF MESH;  Surgeon: Jamesetta So, MD;  Location: [facility] OR[facility];  Service: General;  Laterality: N/A;  . PACEMAKER IMPLANT N/A 12/02/2017   St Jude Medical Assurity MRI conditional  dual-chamber pacemaker by Dr Rayann Heman for symptomatic sinus bradycardia  . POLYPECTOMY  01/12/2018   Procedure: POLYPECTOMY;  Surgeon: Rogene Houston, MD;  [facility];  Service: Endoscopy;;  transverse colon polyp, ascending colon polyps x2, hepatic flexure polyps x2   . SHOULDER SURGERY Right 2005?   for open rotator cuff repair. [place]  . WOUND DEBRIDEMENT Right 01/31/2014   Procedure: DEBRIDEMENT WOUND RIGHT LEG;  Surgeon: Jamesetta So, MD;  Location: [facility];  Service: General;  Laterality: Right;  . WRIST FRACTURE SURGERY Right     Social History   Socioeconomic History  . Marital status: Divorced    Spouse name: Not on file  . Number of children: Not on file  . Years of education: Not on file  . Highest education level: Not on file  Occupational History  . Not on file  Tobacco Use  . Smoking status: Former Smoker    Packs/day: 1.00    Years: 10.00    Pack years: 10.00    Types: Cigarettes    Start date: 10/15/1943    Quit date: 01/28/1954    Years since quitting: 65.2  . Smokeless tobacco: Never Used  . Tobacco comment: tobacco use - no  Substance and Sexual Activity  . Alcohol use: No    Alcohol/week: 0.0 standard drinks  . Drug use: No  . Sexual activity: Not on file  Other Topics Concern  . Not on file  Social History Narrative  . Not on file   Social Determinants of Health   Financial Resource Strain:   . Difficulty of Paying Living Expenses: Not on file  Food Insecurity:   . Worried About Charity fundraiser in the Last Year: Not on file  . Ran Out of Food in the Last Year: Not on file  Transportation Needs:   . Lack  of Transportation (Medical): Not on file  . Lack of Transportation (Non-Medical): Not on file  Physical Activity:   . Days of Exercise per Week: Not on file  . Minutes of Exercise per Session: Not on file  Stress:   . Feeling of Stress : Not on file  Social Connections:   . Frequency of Communication with Friends and Family: Not on file  . Frequency of Social Gatherings with Friends and Family: Not on file  . Attends Religious Services: Not on file  . Active Member of Clubs or Organizations: Not on file  . Attends Archivist Meetings: Not  on file  . Marital Status: Not on file  Intimate Partner Violence:   . Fear of Current or Ex-Partner: Not on file  . Emotionally Abused: Not on file  . Physically Abused: Not on file  . Sexually Abused: Not on file      Vitals:   03/30/19 1359  BP: 102/68  Pulse: 78  SpO2: 95%  Weight: 187 lb 3.2 oz (84.9 kg)  Height: 5\' 8"  (1.727 m)    Wt Readings from Last 3 Encounters:  03/30/19 187 lb 3.2 oz (84.9 kg)  12/23/18 182 lb (82.6 kg)  11/20/18 184 lb (83.5 kg)     PHYSICAL EXAM General: NAD HEENT: Normal. Neck: No JVD, no thyromegaly. Lungs: Clear to auscultation bilaterally with normal respiratory effort. CV: Regular rate and rhythm, normal S1/S2, no S3/S4, no murmur. No pretibial or periankle edema.  No carotid bruit.   Abdomen: Soft, nontender, no distention.  Neurologic: Alert and oriented.  Psych: Normal affect. Skin: Normal. Musculoskeletal: No gross deformities.      Labs: Lab Results  Component Value Date/Time   K 3.9 01/11/2018 07:02 AM   BUN 12 01/11/2018 07:02 AM   BUN 10 11/17/2017 11:55 AM   CREATININE 0.89 01/11/2018 07:02 AM   CREATININE 0.91 08/12/2014 08:25 AM   ALT 9 01/11/2018 07:02 AM   HGB 10.7 (L) 01/13/2018 05:04 AM   HGB 13.7 11/17/2017 11:55 AM     Lipids: No results found for: LDLCALC, LDLDIRECT, CHOL, TRIG, HDL     ASSESSMENT AND PLAN:  1. Symptomatic bradycardia: Status  post implantation of St. Jude pacemaker in November 2019. Followed by Dr. Rayann Heman. Normal device function.  2. Paroxysmal atrial fibrillation:Symptomatically stable.Continue Lopressor 12.5 mg twice daily.Anticoagulated with Xarelto.  3. Hypertension: Controlled.No changes to therapy.  4. Abdominal aortic aneurysm: 5.5 cm on 10/21/2018. Followed by vascular surgery.  5. Lower extremity edema: Likely secondary to venous insufficiency. Compression stockings are recommended.  Continue Lasix 40 mg daily and potassium chloride 20 mEq daily.  6. Exertional fatigue: Symptoms did not improve after getting a pacemaker.  He declined cardiac catheterization at his visit with me in October 2020.  He is now reconsidering this.  He will let me know later this week when he comes in for pacemaker check with Dr. Rayann Heman.   Disposition: Follow up 6 months   Kate Sable, M.D., F.A.C.C.

## 2019-03-31 ENCOUNTER — Encounter: Payer: Self-pay | Admitting: *Deleted

## 2019-03-31 ENCOUNTER — Telehealth: Payer: Self-pay

## 2019-03-31 ENCOUNTER — Ambulatory Visit: Payer: Medicare Other

## 2019-03-31 LAB — CUP PACEART REMOTE DEVICE CHECK
Battery Remaining Longevity: 111 mo
Battery Remaining Percentage: 95.5 %
Battery Voltage: 3.02 V
Brady Statistic AP VP Percent: 1 %
Brady Statistic AP VS Percent: 97 %
Brady Statistic AS VP Percent: 1 %
Brady Statistic AS VS Percent: 3 %
Brady Statistic RA Percent Paced: 96 %
Brady Statistic RV Percent Paced: 1 %
Date Time Interrogation Session: 20210303153848
Implantable Lead Implant Date: 20191105
Implantable Lead Implant Date: 20191105
Implantable Lead Location: 753859
Implantable Lead Location: 753860
Implantable Pulse Generator Implant Date: 20191105
Lead Channel Impedance Value: 390 Ohm
Lead Channel Impedance Value: 430 Ohm
Lead Channel Pacing Threshold Amplitude: 0.5 V
Lead Channel Pacing Threshold Amplitude: 0.5 V
Lead Channel Pacing Threshold Pulse Width: 0.5 ms
Lead Channel Pacing Threshold Pulse Width: 0.5 ms
Lead Channel Sensing Intrinsic Amplitude: 3 mV
Lead Channel Sensing Intrinsic Amplitude: 6.8 mV
Lead Channel Setting Pacing Amplitude: 2 V
Lead Channel Setting Pacing Amplitude: 2.5 V
Lead Channel Setting Pacing Pulse Width: 0.5 ms
Lead Channel Setting Sensing Sensitivity: 2 mV
Pulse Gen Model: 2272
Pulse Gen Serial Number: 9079121

## 2019-03-31 NOTE — Telephone Encounter (Signed)
The pt and his son was trying to send a transmission however, it was unsuccessful. I conference the pt with [employer] to trouble shoot the monitor. Transmission received.

## 2019-03-31 NOTE — Progress Notes (Signed)
Walked into office to give a message to [place] stating that he does not want to do the heart cath.

## 2019-04-01 NOTE — Progress Notes (Signed)
Noted -  Forward to provider for notification.

## 2019-04-01 NOTE — Progress Notes (Signed)
Dr. Bronson Ing aware.

## 2019-04-02 ENCOUNTER — Encounter: Payer: Medicare Other | Admitting: Internal Medicine

## 2019-04-02 DIAGNOSIS — J019 Acute sinusitis, unspecified: Secondary | ICD-10-CM | POA: Diagnosis not present

## 2019-04-02 DIAGNOSIS — I5032 Chronic diastolic (congestive) heart failure: Secondary | ICD-10-CM | POA: Diagnosis not present

## 2019-04-02 DIAGNOSIS — I10 Essential (primary) hypertension: Secondary | ICD-10-CM | POA: Diagnosis not present

## 2019-04-02 DIAGNOSIS — Z950 Presence of cardiac pacemaker: Secondary | ICD-10-CM | POA: Diagnosis not present

## 2019-04-08 DIAGNOSIS — D0439 Carcinoma in situ of skin of other parts of face: Secondary | ICD-10-CM | POA: Diagnosis not present

## 2019-04-08 DIAGNOSIS — L570 Actinic keratosis: Secondary | ICD-10-CM | POA: Diagnosis not present

## 2019-04-08 DIAGNOSIS — C44319 Basal cell carcinoma of skin of other parts of face: Secondary | ICD-10-CM | POA: Diagnosis not present

## 2019-04-08 DIAGNOSIS — L820 Inflamed seborrheic keratosis: Secondary | ICD-10-CM | POA: Diagnosis not present

## 2019-04-14 DIAGNOSIS — M79671 Pain in right foot: Secondary | ICD-10-CM | POA: Diagnosis not present

## 2019-04-14 DIAGNOSIS — M79672 Pain in left foot: Secondary | ICD-10-CM | POA: Diagnosis not present

## 2019-04-14 DIAGNOSIS — I739 Peripheral vascular disease, unspecified: Secondary | ICD-10-CM | POA: Diagnosis not present

## 2019-04-14 DIAGNOSIS — L110 Acquired keratosis follicularis: Secondary | ICD-10-CM | POA: Diagnosis not present

## 2019-04-15 DIAGNOSIS — C44319 Basal cell carcinoma of skin of other parts of face: Secondary | ICD-10-CM | POA: Diagnosis not present

## 2019-04-28 DIAGNOSIS — I10 Essential (primary) hypertension: Secondary | ICD-10-CM | POA: Diagnosis not present

## 2019-04-28 DIAGNOSIS — E7849 Other hyperlipidemia: Secondary | ICD-10-CM | POA: Diagnosis not present

## 2019-05-11 DIAGNOSIS — H02105 Unspecified ectropion of left lower eyelid: Secondary | ICD-10-CM | POA: Diagnosis not present

## 2019-05-11 DIAGNOSIS — H401122 Primary open-angle glaucoma, left eye, moderate stage: Secondary | ICD-10-CM | POA: Diagnosis not present

## 2019-05-11 DIAGNOSIS — H401113 Primary open-angle glaucoma, right eye, severe stage: Secondary | ICD-10-CM | POA: Diagnosis not present

## 2019-05-11 DIAGNOSIS — H0100A Unspecified blepharitis right eye, upper and lower eyelids: Secondary | ICD-10-CM | POA: Diagnosis not present

## 2019-06-24 DIAGNOSIS — L110 Acquired keratosis follicularis: Secondary | ICD-10-CM | POA: Diagnosis not present

## 2019-06-24 DIAGNOSIS — I739 Peripheral vascular disease, unspecified: Secondary | ICD-10-CM | POA: Diagnosis not present

## 2019-06-24 DIAGNOSIS — M79671 Pain in right foot: Secondary | ICD-10-CM | POA: Diagnosis not present

## 2019-06-24 DIAGNOSIS — M79672 Pain in left foot: Secondary | ICD-10-CM | POA: Diagnosis not present

## 2019-06-30 ENCOUNTER — Ambulatory Visit (INDEPENDENT_AMBULATORY_CARE_PROVIDER_SITE_OTHER): Payer: Medicare Other | Admitting: *Deleted

## 2019-06-30 DIAGNOSIS — I495 Sick sinus syndrome: Secondary | ICD-10-CM | POA: Diagnosis not present

## 2019-06-30 LAB — CUP PACEART REMOTE DEVICE CHECK
Battery Remaining Longevity: 110 mo
Battery Remaining Percentage: 95.5 %
Battery Voltage: 3.02 V
Brady Statistic AP VP Percent: 1 %
Brady Statistic AP VS Percent: 96 %
Brady Statistic AS VP Percent: 1 %
Brady Statistic AS VS Percent: 3.5 %
Brady Statistic RA Percent Paced: 96 %
Brady Statistic RV Percent Paced: 1 %
Date Time Interrogation Session: 20210602020015
Implantable Lead Implant Date: 20191105
Implantable Lead Implant Date: 20191105
Implantable Lead Location: 753859
Implantable Lead Location: 753860
Implantable Pulse Generator Implant Date: 20191105
Lead Channel Impedance Value: 400 Ohm
Lead Channel Impedance Value: 410 Ohm
Lead Channel Pacing Threshold Amplitude: 0.5 V
Lead Channel Pacing Threshold Amplitude: 0.5 V
Lead Channel Pacing Threshold Pulse Width: 0.5 ms
Lead Channel Pacing Threshold Pulse Width: 0.5 ms
Lead Channel Sensing Intrinsic Amplitude: 2.8 mV
Lead Channel Sensing Intrinsic Amplitude: 6.6 mV
Lead Channel Setting Pacing Amplitude: 2 V
Lead Channel Setting Pacing Amplitude: 2.5 V
Lead Channel Setting Pacing Pulse Width: 0.5 ms
Lead Channel Setting Sensing Sensitivity: 2 mV
Pulse Gen Model: 2272
Pulse Gen Serial Number: 9079121

## 2019-07-02 NOTE — Progress Notes (Signed)
Remote pacemaker transmission.   

## 2019-07-04 ENCOUNTER — Other Ambulatory Visit: Payer: Self-pay | Admitting: Cardiovascular Disease

## 2019-07-20 ENCOUNTER — Other Ambulatory Visit: Payer: Self-pay

## 2019-07-20 ENCOUNTER — Encounter (HOSPITAL_COMMUNITY): Payer: Self-pay

## 2019-07-20 ENCOUNTER — Observation Stay (HOSPITAL_COMMUNITY)
Admission: EM | Admit: 2019-07-20 | Discharge: 2019-07-22 | Disposition: A | Discharge status: Home / Self Care | Payer: Medicare Other | Attending: Internal Medicine | Admitting: Internal Medicine

## 2019-07-20 DIAGNOSIS — I714 Abdominal aortic aneurysm, without rupture, unspecified: Secondary | ICD-10-CM | POA: Diagnosis present

## 2019-07-20 DIAGNOSIS — Z20822 Contact with and (suspected) exposure to covid-19: Secondary | ICD-10-CM | POA: Insufficient documentation

## 2019-07-20 DIAGNOSIS — Z79899 Other long term (current) drug therapy: Secondary | ICD-10-CM | POA: Insufficient documentation

## 2019-07-20 DIAGNOSIS — Z885 Allergy status to narcotic agent status: Secondary | ICD-10-CM | POA: Diagnosis not present

## 2019-07-20 DIAGNOSIS — Z882 Allergy status to sulfonamides status: Secondary | ICD-10-CM | POA: Insufficient documentation

## 2019-07-20 DIAGNOSIS — N400 Enlarged prostate without lower urinary tract symptoms: Secondary | ICD-10-CM | POA: Diagnosis not present

## 2019-07-20 DIAGNOSIS — K219 Gastro-esophageal reflux disease without esophagitis: Secondary | ICD-10-CM | POA: Insufficient documentation

## 2019-07-20 DIAGNOSIS — D649 Anemia, unspecified: Secondary | ICD-10-CM | POA: Insufficient documentation

## 2019-07-20 DIAGNOSIS — K922 Gastrointestinal hemorrhage, unspecified: Secondary | ICD-10-CM | POA: Diagnosis not present

## 2019-07-20 DIAGNOSIS — G8929 Other chronic pain: Secondary | ICD-10-CM | POA: Insufficient documentation

## 2019-07-20 DIAGNOSIS — K648 Other hemorrhoids: Secondary | ICD-10-CM | POA: Diagnosis not present

## 2019-07-20 DIAGNOSIS — M549 Dorsalgia, unspecified: Secondary | ICD-10-CM | POA: Insufficient documentation

## 2019-07-20 DIAGNOSIS — Z888 Allergy status to other drugs, medicaments and biological substances status: Secondary | ICD-10-CM | POA: Diagnosis not present

## 2019-07-20 DIAGNOSIS — I480 Paroxysmal atrial fibrillation: Secondary | ICD-10-CM | POA: Diagnosis not present

## 2019-07-20 DIAGNOSIS — K5730 Diverticulosis of large intestine without perforation or abscess without bleeding: Secondary | ICD-10-CM | POA: Insufficient documentation

## 2019-07-20 DIAGNOSIS — Z87891 Personal history of nicotine dependence: Secondary | ICD-10-CM | POA: Diagnosis not present

## 2019-07-20 DIAGNOSIS — I4891 Unspecified atrial fibrillation: Secondary | ICD-10-CM | POA: Diagnosis present

## 2019-07-20 DIAGNOSIS — K625 Hemorrhage of anus and rectum: Principal | ICD-10-CM | POA: Insufficient documentation

## 2019-07-20 DIAGNOSIS — G4730 Sleep apnea, unspecified: Secondary | ICD-10-CM | POA: Insufficient documentation

## 2019-07-20 DIAGNOSIS — Z7901 Long term (current) use of anticoagulants: Secondary | ICD-10-CM | POA: Insufficient documentation

## 2019-07-20 DIAGNOSIS — I10 Essential (primary) hypertension: Secondary | ICD-10-CM | POA: Diagnosis not present

## 2019-07-20 DIAGNOSIS — D122 Benign neoplasm of ascending colon: Secondary | ICD-10-CM | POA: Insufficient documentation

## 2019-07-20 LAB — COMPREHENSIVE METABOLIC PANEL
ALT: 11 U/L (ref 0–44)
AST: 16 U/L (ref 15–41)
Albumin: 3.5 g/dL (ref 3.5–5.0)
Alkaline Phosphatase: 52 U/L (ref 38–126)
Anion gap: 10 (ref 5–15)
BUN: 12 mg/dL (ref 8–23)
CO2: 23 mmol/L (ref 22–32)
Calcium: 9.1 mg/dL (ref 8.9–10.3)
Chloride: 104 mmol/L (ref 98–111)
Creatinine, Ser: 1.09 mg/dL (ref 0.61–1.24)
GFR calc Af Amer: >60 mL/min (ref >60)
GFR calc non Af Amer: 59 mL/min — ABNORMAL LOW (ref >60)
Glucose, Bld: 124 mg/dL — ABNORMAL HIGH (ref 70–99)
Potassium: 3.9 mmol/L (ref 3.5–5.1)
Sodium: 137 mmol/L (ref 135–145)
Total Bilirubin: 1 mg/dL (ref 0.3–1.2)
Total Protein: 6.5 g/dL (ref 6.5–8.1)

## 2019-07-20 LAB — TYPE AND SCREEN
ABO/RH(D): AB POS
Antibody Screen: NEGATIVE

## 2019-07-20 LAB — CBC
HCT: 34.1 % — ABNORMAL LOW (ref 39.0–52.0)
Hemoglobin: 10.8 g/dL — ABNORMAL LOW (ref 13.0–17.0)
MCH: 29 pg (ref 26.0–34.0)
MCHC: 31.7 g/dL (ref 30.0–36.0)
MCV: 91.4 fL (ref 80.0–100.0)
Platelets: 177 10*3/uL (ref 150–400)
RBC: 3.73 MIL/uL — ABNORMAL LOW (ref 4.22–5.81)
RDW: 13.2 % (ref 11.5–15.5)
WBC: 6.6 10*3/uL (ref 4.0–10.5)
nRBC: 0 % (ref 0.0–0.2)

## 2019-07-20 LAB — PROTIME-INR
INR: 1.8 — ABNORMAL HIGH (ref 0.8–1.2)
Prothrombin Time: 20.2 seconds — ABNORMAL HIGH (ref 11.4–15.2)

## 2019-07-20 LAB — RETICULOCYTES
Immature Retic Fract: 11.7 % (ref 2.3–15.9)
RBC.: 3.75 MIL/uL — ABNORMAL LOW (ref 4.22–5.81)
Retic Count, Absolute: 45 10*3/uL (ref 19.0–186.0)
Retic Ct Pct: 1.2 % (ref 0.4–3.1)

## 2019-07-20 LAB — APTT: aPTT: 39 seconds — ABNORMAL HIGH (ref 24–36)

## 2019-07-20 LAB — IRON AND TIBC
Iron: 46 ug/dL (ref 45–182)
Saturation Ratios: 13 % — ABNORMAL LOW (ref 17.9–39.5)
TIBC: 361 ug/dL (ref 250–450)
UIBC: 315 ug/dL

## 2019-07-20 LAB — FOLATE: Folate: 23.9 ng/mL (ref >5.9)

## 2019-07-20 LAB — SARS CORONAVIRUS 2 BY RT PCR (HOSPITAL ORDER, PERFORMED IN ~~LOC~~ HOSPITAL LAB): SARS Coronavirus 2: NEGATIVE

## 2019-07-20 LAB — VITAMIN B12: Vitamin B-12: 207 pg/mL (ref 180–914)

## 2019-07-20 LAB — POC OCCULT BLOOD, ED: Fecal Occult Bld: POSITIVE — AB

## 2019-07-20 LAB — FERRITIN: Ferritin: 10 ng/mL — ABNORMAL LOW (ref 24–336)

## 2019-07-20 MED ORDER — ONDANSETRON HCL 4 MG PO TABS: 4.0000 mg | ORAL_TABLET | Freq: Four times a day (QID) | ORAL | Status: DC | PRN

## 2019-07-20 MED ORDER — METOPROLOL TARTRATE 25 MG PO TABS
12.5000 mg | ORAL_TABLET | Freq: Two times a day (BID) | ORAL | Status: DC
  Administered 2019-07-20 – 2019-07-22 (×4): 12.5 mg via ORAL
  Filled 2019-07-20 (×6): qty 1

## 2019-07-20 MED ORDER — AMLODIPINE BESYLATE 5 MG PO TABS
2.5000 mg | ORAL_TABLET | Freq: Every day | ORAL | Status: DC
  Administered 2019-07-21 – 2019-07-22 (×2): 2.5 mg via ORAL
  Filled 2019-07-20 (×2): qty 1

## 2019-07-20 MED ORDER — LATANOPROST 0.005 % OP SOLN
1.0000 [drp] | Freq: Every day | OPHTHALMIC | Status: DC
  Administered 2019-07-20 – 2019-07-21 (×2): 1 [drp] via OPHTHALMIC
  Filled 2019-07-20 (×2): qty 2.5

## 2019-07-20 MED ORDER — FINASTERIDE 5 MG PO TABS
5.0000 mg | ORAL_TABLET | Freq: Every day | ORAL | Status: DC
  Administered 2019-07-21 – 2019-07-22 (×2): 5 mg via ORAL
  Filled 2019-07-20 (×2): qty 1

## 2019-07-20 MED ORDER — LACTATED RINGERS IV SOLN: INTRAVENOUS | Status: AC

## 2019-07-20 MED ORDER — SODIUM CHLORIDE 0.9 % IV BOLUS
500.0000 mL | Freq: Once | INTRAVENOUS | Status: AC
  Administered 2019-07-20: 500 mL via INTRAVENOUS

## 2019-07-20 MED ORDER — ONDANSETRON HCL 4 MG/2ML IJ SOLN: 4.0000 mg | Freq: Four times a day (QID) | INTRAMUSCULAR | Status: DC | PRN

## 2019-07-20 MED ORDER — PANTOPRAZOLE SODIUM 40 MG IV SOLR
40.0000 mg | INTRAVENOUS | Status: DC
  Administered 2019-07-20 – 2019-07-21 (×2): 40 mg via INTRAVENOUS
  Filled 2019-07-20 (×2): qty 40

## 2019-07-20 MED ORDER — DORZOLAMIDE HCL 2 % OP SOLN
1.0000 [drp] | Freq: Two times a day (BID) | OPHTHALMIC | Status: DC
  Administered 2019-07-20 – 2019-07-22 (×4): 1 [drp] via OPHTHALMIC
  Filled 2019-07-20 (×2): qty 10

## 2019-07-20 MED ORDER — ALFUZOSIN HCL ER 10 MG PO TB24
10.0000 mg | ORAL_TABLET | Freq: Every day | ORAL | Status: DC
  Administered 2019-07-21 – 2019-07-22 (×2): 10 mg via ORAL
  Filled 2019-07-20 (×3): qty 1

## 2019-07-20 NOTE — H&P (Addendum)
History and Physical    [name] YNW:[id number] DOB: 1926-04-10 DOA: 07/20/2019  PCP: Curlene Labrum, MD   Patient coming from: Home  Chief Complaint: Rectal bleeding  HPI: [name] is a 86 y.o. male with medical history significant for atrial fibrillation on Xarelto, BPH, GERD, hypertension, and noted diverticulosis as well as internal hemorrhoids on prior colonoscopy 12/2017 who presented to the ED with persistent rectal bleeding over the past 5 days.  He states that he is noted quite a bit of blood on his stool and toilet bowl.  His stools have appeared to look like grape jelly.  The episodes became more frequent this a.m. with 3 episodes noted around 3 AM.  He denies any abdominal pain, nausea, vomiting, fever, chills, or any other symptoms.  He denies any lightheadedness or dizziness.  He takes Xarelto regularly and took last dose this morning.   ED Course: Stable vital signs noted and hemoglobin is 10.8 which is near his baseline.  INR is 1.8.  FOBT is positive.  Creatinine is 1.09.  Review of Systems: All others reviewed and otherwise negative except as noted above.  Past Medical History:  Diagnosis Date  . AAA (abdominal aortic aneurysm) ([place])   . Atrial fibrillation (HCC)    bicarbonate perfusion study 10/11 EF 69%. small defects no ischemia. 7 metastases acheived. ER visit 11/10/09 ruled out MI normal sinus rhythm orthostasis after sublingual nitroglycerin   . BPH (benign prostatic hyperplasia)   . Chronic back pain   . GERD (gastroesophageal reflux disease)   . Hypertension   . Orthostatic hypotension   . PONV (postoperative nausea and vomiting)   . Sleep apnea    CPAP at night  . Stress fracture of foot    left  . Swelling of both lower extremities     Past Surgical History:  Procedure Laterality Date  . CHOLECYSTECTOMY  2012   [facility] by Dr. Geroge Baseman  . COLONOSCOPY N/A 08/27/2012   Procedure: COLONOSCOPY;  Surgeon: Rogene Houston, MD;   [facility];  Service: Endoscopy;  Laterality: N/A;  325  . COLONOSCOPY N/A 01/12/2018   Procedure: COLONOSCOPY;  Surgeon: Rogene Houston, MD;  [facility];  Service: Endoscopy;  Laterality: N/A;  Has staff Christmas party at his house at lunch today per office  . HERNIA REPAIR Bilateral    Inguinal and umbilical  . INCISIONAL HERNIA REPAIR N/A 01/31/2014   Procedure: Fatima Blank HERNIORRHAPHY WITH MESH;  Surgeon: Jamesetta So, MD;  Location: [facility];  Service: General;  Laterality: N/A;  . INSERTION OF MESH N/A 01/31/2014   Procedure: INSERTION OF MESH;  Surgeon: Jamesetta So, MD;  Location: [facility];  Service: General;  Laterality: N/A;  . PACEMAKER IMPLANT N/A 12/02/2017   St Jude Medical Assurity MRI conditional  dual-chamber pacemaker by Dr Rayann Heman for symptomatic sinus bradycardia  . POLYPECTOMY  01/12/2018   Procedure: POLYPECTOMY;  Surgeon: Rogene Houston, MD;  [facility];  Service: Endoscopy;;  transverse colon polyp, ascending colon polyps x2, hepatic flexure polyps x2   . SHOULDER SURGERY Right 2005?   for open rotator cuff repair. [place]  . WOUND DEBRIDEMENT Right 01/31/2014   Procedure: DEBRIDEMENT WOUND RIGHT LEG;  Surgeon: Jamesetta So, MD;  Location: [facility];  Service: General;  Laterality: Right;  . WRIST FRACTURE SURGERY Right      reports that he quit smoking about 65 years ago. His  smoking use included cigarettes. He started smoking about 75 years ago. He has a 10.00 pack-year smoking history. He has never used smokeless tobacco. He reports that he does not drink alcohol and does not use drugs.  Allergies  Allergen Reactions  . Flexeril [Cyclobenzaprine Hcl] Nausea And Vomiting  . Percocet [Oxycodone-Acetaminophen] Nausea And Vomiting  . Vicodin [Hydrocodone-Acetaminophen] Nausea And Vomiting  . Sulfonamide Derivatives Rash    Family History  Problem Relation Age of Onset  . Heart failure Mother   . Heart failure  Father   . Cancer Sister     Prior to Admission medications   Medication Sig Start Date End Date Taking? Authorizing Provider  alfuzosin (UROXATRAL) 10 MG 24 hr tablet Take 10 mg by mouth daily with breakfast.   Yes [provider]  amLODipine (NORVASC) 2.5 MG tablet TAKE 1 TABLET BY MOUTH EVERY DAY 01/04/19  Yes Allred, Jeneen Rinks, MD  CALCIUM-MAGNESIUM-ZINC PO Take 1 tablet by mouth daily.   Yes [provider]  cholecalciferol (VITAMIN D3) 25 MCG (1000 UT) tablet Take 1,000 Units by mouth daily.   Yes [provider]  dorzolamide (TRUSOPT) 2 % ophthalmic solution Place 1 drop into the right eye 2 (two) times daily.    Yes [provider]  esomeprazole (NEXIUM) 20 MG capsule Take 20 mg by mouth daily.   Yes [provider]  finasteride (PROSCAR) 5 MG tablet Take 5 mg by mouth daily.     Yes [provider]  furosemide (LASIX) 40 MG tablet TAKE 1 TABLET BY MOUTH EVERY DAY 03/04/19  Yes Herminio Commons, MD  latanoprost (XALATAN) 0.005 % ophthalmic solution Place 1 drop into both eyes at bedtime.    Yes [provider]  metoprolol tartrate (LOPRESSOR) 25 MG tablet TAKE 1/2 TABLET BY MOUTH TWICE DAILY 02/03/19  Yes Herminio Commons, MD  Omega-3 Fatty Acids (OMEGA-3 2100 PO) Take 2,100 mg by mouth daily.   Yes [provider]  potassium chloride SA (KLOR-CON) 20 MEQ tablet TAKE 1 TABLET BY MOUTH EVERY DAY 02/12/19  Yes Herminio Commons, MD  UNABLE TO FIND at bedtime. On CPAP   Yes [provider]  XARELTO 20 MG TABS tablet TAKE 1 TABLET BY MOUTH DAILY WITH SUPPER 07/05/19  Yes Herminio Commons, MD    Physical Exam: Vitals:   07/20/19 1158 07/20/19 1203 07/20/19 1230 07/20/19 1300  BP: 117/72  (!) 137/119 108/75  Pulse: (!) 57 64 70 64  Resp: (!) 21 15 (!) 22 15  Temp:      TempSrc:      SpO2: 94% 92% 94% 92%  Weight:      Height:        Constitutional: NAD, calm, comfortable Vitals:   07/20/19 1158  07/20/19 1203 07/20/19 1230 07/20/19 1300  BP: 117/72  (!) 137/119 108/75  Pulse: (!) 57 64 70 64  Resp: (!) 21 15 (!) 22 15  Temp:      TempSrc:      SpO2: 94% 92% 94% 92%  Weight:      Height:       Eyes: lids and conjunctivae normal ENMT: Mucous membranes are moist.  Neck: normal, supple Respiratory: clear to auscultation bilaterally. Normal respiratory effort. No accessory muscle use.  Cardiovascular: Regular rate and rhythm, no murmurs. No extremity edema. Abdomen: no tenderness, no distention. Bowel sounds positive.  Musculoskeletal:  No joint deformity upper and lower extremities.   Skin: no rashes, lesions, ulcers.  Psychiatric:  Normal judgment and insight. Alert and oriented x 3. Normal mood.   Labs on Admission: I have personally reviewed following labs and imaging studies  CBC: Recent Labs  Lab 07/20/19 1015  WBC 6.6  HGB 10.8*  HCT 34.1*  MCV 91.4  PLT 825   Basic Metabolic Panel: Recent Labs  Lab 07/20/19 1015  NA 137  K 3.9  CL 104  CO2 23  GLUCOSE 124*  BUN 12  CREATININE 1.09  CALCIUM 9.1   GFR: Estimated Creatinine Clearance: 47.3 mL/min (by C-G formula based on SCr of 1.09 mg/dL). Liver Function Tests: Recent Labs  Lab 07/20/19 1015  AST 16  ALT 11  ALKPHOS 52  BILITOT 1.0  PROT 6.5  ALBUMIN 3.5   No results for input(s): LIPASE, AMYLASE in the last 168 hours. No results for input(s): AMMONIA in the last 168 hours. Coagulation Profile: Recent Labs  Lab 07/20/19 1015  INR 1.8*   Cardiac Enzymes: No results for input(s): CKTOTAL, CKMB, CKMBINDEX, TROPONINI in the last 168 hours. BNP (last 3 results) No results for input(s): PROBNP in the last 8760 hours. HbA1C: No results for input(s): HGBA1C in the last 72 hours. CBG: No results for input(s): GLUCAP in the last 168 hours. Lipid Profile: No results for input(s): CHOL, HDL, LDLCALC, TRIG, CHOLHDL, LDLDIRECT in the last 72 hours. Thyroid Function Tests: No results for  input(s): TSH, T4TOTAL, FREET4, T3FREE, THYROIDAB in the last 72 hours. Anemia Panel: No results for input(s): VITAMINB12, FOLATE, FERRITIN, TIBC, IRON, RETICCTPCT in the last 72 hours. Urine analysis:    Component Value Date/Time   COLORURINE YELLOW 11/17/2014 [place] 11/17/2014 1634   LABSPEC 1.010 11/17/2014 1634   PHURINE 5.5 11/17/2014 1634   GLUCOSEU NEGATIVE 11/17/2014 1634   HGBUR MODERATE (A) 11/17/2014 1634   BILIRUBINUR NEGATIVE 11/17/2014 [place] 11/17/2014 1634   PROTEINUR NEGATIVE 11/17/2014 1634   UROBILINOGEN 0.2 11/17/2014 1634   NITRITE NEGATIVE 11/17/2014 1634   LEUKOCYTESUR NEGATIVE 11/17/2014 1634    Radiological Exams on Admission: No results found.   Assessment/Plan Active Problems:   GI bleed    Rectal bleeding -Prior colonoscopy 12/2017 with findings of diverticulosis and internal hemorrhoids which are likely source of bleeding -Appreciate GI reevaluation -Plan to observe and transfuse as needed with repeat H&H in a.m. -Hold Xarelto for now and maintain on SCDs -Keep n.p.o. until evaluated by GI  Normocytic anemia -Currently stable with baseline around 10-11 -Check anemia panel -Transfuse as needed for hemoglobin less than 7  Atrial fibrillation -On Xarelto for anticoagulation -Metoprolol for heart rate control  Hypertension -Continue home metoprolol and amlodipine -Hold Lasix for now until diet further advanced  BPH -Continue home medications  GERD -We will be on IV PPI daily for now   DVT prophylaxis: SCDs Code Status: Full code Family Communication: None at bedside Disposition Plan: Admit for further GI bleeding observation/evaluation/management Consults called: GI-Dr. Laural Golden Admission status: Obs, Tele Status is: Observation  The patient remains OBS appropriate and will d/c before 2 midnights.  Dispo: The patient is from: Home              Anticipated d/c is to: Home               Anticipated d/c date is: 1 day              Patient currently is not medically stable to d/c.  Will need observation for further GI bleeding as well  as GI evaluation.  Madalin Hughart D Manuella Ghazi DO [facility]  If 7PM-7AM, please contact night-coverage [email or web address]  07/20/2019, 1:25 PM

## 2019-07-20 NOTE — ED Triage Notes (Signed)
Pt reports rectal bleeding since Thursday.  Describes stool as "grape jelly."  Denies any pain or dizziness.

## 2019-07-20 NOTE — ED Provider Notes (Signed)
[facility] Emergency Department Provider Note MRN:  [id number]  Arrival date & time: 07/20/19     Chief Complaint   Rectal Bleeding   History of Present Illness   [name] is a 86 y.o. year-old male with a history of AAA presenting to the ED with chief complaint of rectal bleeding.  Persistent rectal bleeding for the past 5 days.  Multiple episodes of grape jelly appearing blood per rectum each day, 3 episodes since 3 AM this morning.  Denies pain, specifically no chest pain, no abdominal pain, no shortness of breath, no recent fever.  Takes Xarelto, took last dose this morning.  Denies dizziness or lightheadedness, otherwise feels normal.  Review of Systems  A complete 10 system review of systems was obtained and all systems are negative except as noted in the HPI and PMH.   Patient's Health History    Past Medical History:  Diagnosis Date  . AAA (abdominal aortic aneurysm) ([place])   . Atrial fibrillation (HCC)    bicarbonate perfusion study 10/11 EF 69%. small defects no ischemia. 7 metastases acheived. ER visit 11/10/09 ruled out MI normal sinus rhythm orthostasis after sublingual nitroglycerin   . BPH (benign prostatic hyperplasia)   . Chronic back pain   . GERD (gastroesophageal reflux disease)   . Hypertension   . Orthostatic hypotension   . PONV (postoperative nausea and vomiting)   . Sleep apnea    CPAP at night  . Stress fracture of foot    left  . Swelling of both lower extremities     Past Surgical History:  Procedure Laterality Date  . CHOLECYSTECTOMY  2012   [facility] by Dr. Geroge Baseman  . COLONOSCOPY N/A 08/27/2012   Procedure: COLONOSCOPY;  Surgeon: Rogene Houston, MD;  Location: [facility];  Service: Endoscopy;  Laterality: N/A;  325  . COLONOSCOPY N/A 01/12/2018   Procedure: COLONOSCOPY;  Surgeon: Rogene Houston, MD;  Location: [facility] END[facility];  Service: Endoscopy;  Laterality: N/A;  Has staff Christmas party at his  house at lunch today per office  . HERNIA REPAIR Bilateral    Inguinal and umbilical  . INCISIONAL HERNIA REPAIR N/A 01/31/2014   Procedure: Fatima Blank HERNIORRHAPHY WITH MESH;  Surgeon: Jamesetta So, MD;  Location: [facility] ORS;  Service: General;  Laterality: N/A;  . INSERTION OF MESH N/A 01/31/2014   Procedure: INSERTION OF MESH;  Surgeon: Jamesetta So, MD;  Location: [facility] ORS;  Service: General;  Laterality: N/A;  . PACEMAKER IMPLANT N/A 12/02/2017   St Jude Medical Assurity MRI conditional  dual-chamber pacemaker by Dr Rayann Heman for symptomatic sinus bradycardia  . POLYPECTOMY  01/12/2018   Procedure: POLYPECTOMY;  Surgeon: Rogene Houston, MD;  Location: [facility];  Service: Endoscopy;;  transverse colon polyp, ascending colon polyps x2, hepatic flexure polyps x2   . SHOULDER SURGERY Right 2005?   for open rotator cuff repair. [place]  . WOUND DEBRIDEMENT Right 01/31/2014   Procedure: DEBRIDEMENT WOUND RIGHT LEG;  Surgeon: Jamesetta So, MD;  Location: [facility] ORS;  Service: General;  Laterality: Right;  . WRIST FRACTURE SURGERY Right     Family History  Problem Relation Age of Onset  . Heart failure Mother   . Heart failure Father   . Cancer Sister     Social History   Socioeconomic History  . Marital status: Divorced    Spouse name: Not on file  . Number of children: Not on  file  . Years of education: Not on file  . Highest education level: Not on file  Occupational History  . Not on file  Tobacco Use  . Smoking status: Former Smoker    Packs/day: 1.00    Years: 10.00    Pack years: 10.00    Types: Cigarettes    Start date: 10/15/1943    Quit date: 01/28/1954    Years since quitting: 65.5  . Smokeless tobacco: Never Used  . Tobacco comment: tobacco use - no  Vaping Use  . Vaping Use: Never used  Substance and Sexual Activity  . Alcohol use: No    Alcohol/week: 0.0 standard drinks  . Drug use: No  . Sexual activity: Not on file  Other Topics Concern  . Not  on file  Social History Narrative  . Not on file   Social Determinants of Health   Financial Resource Strain:   . Difficulty of Paying Living Expenses:   Food Insecurity:   . Worried About Charity fundraiser in the Last Year:   . Arboriculturist in the Last Year:   Transportation Needs:   . Film/video editor (Medical):   Marland Kitchen Lack of Transportation (Non-Medical):   Physical Activity:   . Days of Exercise per Week:   . Minutes of Exercise per Session:   Stress:   . Feeling of Stress :   Social Connections:   . Frequency of Communication with Friends and Family:   . Frequency of Social Gatherings with Friends and Family:   . Attends Religious Services:   . Active Member of Clubs or Organizations:   . Attends Archivist Meetings:   Marland Kitchen Marital Status:   Intimate Partner Violence:   . Fear of Current or Ex-Partner:   . Emotionally Abused:   Marland Kitchen Physically Abused:   . Sexually Abused:      Physical Exam   Vitals:   07/20/19 1030 07/20/19 1100  BP: 124/76 111/74  Pulse: 61 63  Resp: 18 14  Temp:    SpO2: 97% 92%    CONSTITUTIONAL: Well-appearing, NAD NEURO:  Alert and oriented x 3, no focal deficits EYES:  eyes equal and reactive ENT/NECK:  no LAD, no JVD CARDIO: Regular rate, well-perfused, normal S1 and S2 PULM:  CTAB no wheezing or rhonchi GI/GU:  normal bowel sounds, non-distended, non-tender MSK/SPINE:  No gross deformities, no edema SKIN:  no rash, atraumatic PSYCH:  Appropriate speech and behavior  *Additional and/or pertinent findings included in MDM below  Diagnostic and Interventional Summary    EKG Interpretation  Date/Time:    Ventricular Rate:    PR Interval:    QRS Duration:   QT Interval:    QTC Calculation:   R Axis:     Text Interpretation:        Labs Reviewed  COMPREHENSIVE METABOLIC PANEL - Abnormal; Notable for the following components:      Result Value   Glucose, Bld 124 (*)    GFR calc non Af Amer 59 (*)    All  other components within normal limits  CBC - Abnormal; Notable for the following components:   RBC 3.73 (*)    Hemoglobin 10.8 (*)    HCT 34.1 (*)    All other components within normal limits  PROTIME-INR - Abnormal; Notable for the following components:   Prothrombin Time 20.2 (*)    INR 1.8 (*)    All other components within normal limits  APTT -  Abnormal; Notable for the following components:   aPTT 39 (*)    All other components within normal limits  POC OCCULT BLOOD, ED - Abnormal; Notable for the following components:   Fecal Occult Bld POSITIVE (*)    All other components within normal limits  SARS CORONAVIRUS 2 BY RT PCR (HOSPITAL ORDER, [place])  TYPE AND SCREEN    No orders to display    Medications  sodium chloride 0.9 % bolus 500 mL (has no administration in time range)     Procedures  /  Critical Care Procedures  ED Course and Medical Decision Making  I have reviewed the triage vital signs, the nursing notes, and pertinent available records from the EMR.  Listed above are laboratory and imaging tests that I personally ordered, reviewed, and interpreted and then considered in my medical decision making (see below for details).      Rectal bleeding in this anticoagulated 86 year old male, per chart review had a hospital admission for rectal bleeding back in 2019.  Seemed to be related to diverticular bleed and/or polyp bleeding found on colonoscopy at that time.  Patient is hemodynamically stable, no symptoms to suggest anemia.  Still given the amount of persistent bleeding anticipating admission, awaiting laboratory assessment.  Will likely consult GI and admit to the hospitalist service.  Barth Kirks. Sedonia Small, MD [place] [email or web address]  Final Clinical Impressions(s) / ED Diagnoses     ICD-10-CM   1. Lower GI bleed  K92.2     ED Discharge Orders    None       Discharge  Instructions Discussed with and Provided to Patient:   Discharge Instructions   None       Maudie Flakes, MD 07/20/19 1120

## 2019-07-20 NOTE — Consult Note (Signed)
Referring Provider: Heath Lark, DO Primary Care Physician:  Curlene Labrum, MD Primary Gastroenterologist:  Dr. Laural Golden  Reason for Consultation:    Rectal bleeding.  HPI:   Patient is 86 year old Caucasian male was history of atrial fibrillation on anticoagulant as well as history of colonic diverticulosis and colonic adenomas(GI bleed in December 2019) who was in usual state of health until 5 days ago when he noted small amount of blood with his bowel movement.  He did not experience abdominal pain nausea vomiting or postural symptoms.  4 days ago he was seen at his primary care physician's office and was recommended for him to hold Xarelto for couple days.  He does not remember that he had any bleeding on Friday but decided to continue anticoagulant.  He does not recall if he bled over the weekend but he started the past blood per rectum yesterday.  He also had a bloody bowel movement early this morning.  He says it was a large amount of blood and he has been passing what he describes current jelly stool.  Once again he has not experienced diarrhea abdominal pain nausea vomiting weakness lightheadedness fever or chills.  Patient reported to emergency room early this morning.  He was noted to be hemodynamically stable.  His hemoglobin was noted to be 10.8.  Hemoglobin 18 months ago when he was hospitalized for GI bleed was 10.7 at the time of discharge.  Patient does not know of his hemoglobin returned to normal. Last Xarelto dose was this morning.  He does not take aspirin or other OTC NSAIDs.  He denies involuntary weight loss.  He also denies chest pain or shortness of breath.  He says he has chronic lower extremity edema and he has chronic pain involving left foot.  He does complain of intermittent pain in the right lower quadrant but he is pain-free at the present time. He lives in [place] along with his friend Ms. [employer].  He states they have been together for 30 years. He  he is divorced.  He has 3 children.  He has twin sons and a daughter.  1 son lives in [place] and the 2 other live in [place].  Patient worked with [employer] in [facility] [employer] [facility] for several years and he retired over 30 years ago.  He does not drink alcohol.  He quit cigarette smoking in 1956 after having smoked for 11 years about a pack a day. Both parents heart disease.  Mother had bad heartburn lived to be in her 70s.  Father died of MI at age 45.  68 brother died of lung carcinoma in his 38s.  He had 2 sisters and they are both disease.  One died of ovarian carcinoma at 61 and the other one died of stomach cancer in early 61s.   Past Medical History:  Diagnosis Date  . AAA (abdominal aortic aneurysm) ([place])   . Atrial fibrillation (HCC)    bicarbonate perfusion study 10/11 EF 69%. small defects no ischemia. 7 metastases acheived. ER visit 11/10/09 ruled out MI normal sinus rhythm orthostasis after sublingual nitroglycerin   . BPH (benign prostatic hyperplasia)   . Chronic back pain   . GERD (gastroesophageal reflux disease)   . Hypertension   . Orthostatic hypotension   . PONV (postoperative nausea and vomiting)   . Sleep apnea    CPAP at night  . Stress fracture of foot    left  . Swelling of both lower extremities  Past Surgical History:  Procedure Laterality Date  . CHOLECYSTECTOMY  2012   [facility] by Dr. Geroge Baseman  . COLONOSCOPY N/A 08/27/2012   Procedure: COLONOSCOPY;  Surgeon: Rogene Houston, MD;  [facility];  Service: Endoscopy;  Laterality: N/A;  325  . COLONOSCOPY N/A 01/12/2018   Procedure: COLONOSCOPY;  Surgeon: Rogene Houston, MD;  Location: [facility];  Service: Endoscopy;  Laterality: N/A;  Has staff Christmas party at his house at lunch today per office  . HERNIA REPAIR Bilateral    Inguinal and umbilical  . INCISIONAL HERNIA REPAIR N/A 01/31/2014   Procedure: Fatima Blank HERNIORRHAPHY WITH MESH;  Surgeon: Jamesetta So, MD;  Location: [facility]  ORS;  Service: General;  Laterality: N/A;  . INSERTION OF MESH N/A 01/31/2014   Procedure: INSERTION OF MESH;  Surgeon: Jamesetta So, MD;  Location: [facility] ORS;  Service: General;  Laterality: N/A;  . PACEMAKER IMPLANT N/A 12/02/2017   St Jude Medical Assurity MRI conditional  dual-chamber pacemaker by Dr Rayann Heman for symptomatic sinus bradycardia  . POLYPECTOMY  01/12/2018   Procedure: POLYPECTOMY;  Surgeon: Rogene Houston, MD;  Location: [facility];  Service: Endoscopy;;  transverse colon polyp, ascending colon polyps x2, hepatic flexure polyps x2   . SHOULDER SURGERY Right 2005?   for open rotator cuff repair. [place]  . WOUND DEBRIDEMENT Right 01/31/2014   Procedure: DEBRIDEMENT WOUND RIGHT LEG;  Surgeon: Jamesetta So, MD;  Location: [facility] ORS;  Service: General;  Laterality: Right;  . WRIST FRACTURE SURGERY Right     Prior to Admission medications   Medication Sig Start Date End Date Taking? Authorizing Provider  alfuzosin (UROXATRAL) 10 MG 24 hr tablet Take 10 mg by mouth daily with breakfast.   Yes [provider]  amLODipine (NORVASC) 2.5 MG tablet TAKE 1 TABLET BY MOUTH EVERY DAY 01/04/19  Yes Allred, Jeneen Rinks, MD  CALCIUM-MAGNESIUM-ZINC PO Take 1 tablet by mouth daily.   Yes [provider]  cholecalciferol (VITAMIN D3) 25 MCG (1000 UT) tablet Take 1,000 Units by mouth daily.   Yes [provider]  dorzolamide (TRUSOPT) 2 % ophthalmic solution Place 1 drop into the right eye 2 (two) times daily.    Yes [provider]  esomeprazole (NEXIUM) 20 MG capsule Take 20 mg by mouth daily.   Yes [provider]  finasteride (PROSCAR) 5 MG tablet Take 5 mg by mouth daily.     Yes [provider]  furosemide (LASIX) 40 MG tablet TAKE 1 TABLET BY MOUTH EVERY DAY 03/04/19  Yes Herminio Commons, MD  latanoprost (XALATAN) 0.005 % ophthalmic solution Place 1 drop into both eyes at bedtime.    Yes [provider]  metoprolol  tartrate (LOPRESSOR) 25 MG tablet TAKE 1/2 TABLET BY MOUTH TWICE DAILY 02/03/19  Yes Herminio Commons, MD  Omega-3 Fatty Acids (OMEGA-3 2100 PO) Take 2,100 mg by mouth daily.   Yes [provider]  potassium chloride SA (KLOR-CON) 20 MEQ tablet TAKE 1 TABLET BY MOUTH EVERY DAY 02/12/19  Yes Herminio Commons, MD  UNABLE TO FIND at bedtime. On CPAP   Yes [provider]  XARELTO 20 MG TABS tablet TAKE 1 TABLET BY MOUTH DAILY WITH SUPPER 07/05/19  Yes Herminio Commons, MD    Current Facility-Administered Medications  Medication Dose Route Frequency Provider Last Rate Last Admin  . [START ON 07/21/2019] alfuzosin (UROXATRAL) 24 hr tablet 10 mg  10 mg  Oral Q breakfast Manuella Ghazi, Pratik D, DO      . amLODipine (NORVASC) tablet 2.5 mg  2.5 mg Oral Daily Manuella Ghazi, Pratik D, DO      . dorzolamide (TRUSOPT) 2 % ophthalmic solution 1 drop  1 drop Right Eye BID Heath Lark D, DO      . [START ON 07/21/2019] finasteride (PROSCAR) tablet 5 mg  5 mg Oral Daily Manuella Ghazi, Pratik D, DO      . lactated ringers infusion   Intravenous Continuous Heath Lark D, DO 75 mL/hr at 07/20/19 1429 New Bag at 07/20/19 1429  . latanoprost (XALATAN) 0.005 % ophthalmic solution 1 drop  1 drop Both Eyes QHS Shah, Pratik D, DO      . metoprolol tartrate (LOPRESSOR) tablet 12.5 mg  12.5 mg Oral BID Manuella Ghazi, Pratik D, DO      . ondansetron (ZOFRAN) tablet 4 mg  4 mg Oral Q6H PRN Manuella Ghazi, Pratik D, DO       Or  . ondansetron (ZOFRAN) injection 4 mg  4 mg Intravenous Q6H PRN Manuella Ghazi, Pratik D, DO      . pantoprazole (PROTONIX) injection 40 mg  40 mg Intravenous Q24H Manuella Ghazi, Pratik D, DO   40 mg at 07/20/19 1432    Allergies as of 07/20/2019 - Review Complete 07/20/2019  Allergen Reaction Noted  . Flexeril [cyclobenzaprine hcl] Nausea And Vomiting 08/16/2010  . Percocet [oxycodone-acetaminophen] Nausea And Vomiting 08/16/2010  . Vicodin [hydrocodone-acetaminophen] Nausea And Vomiting 08/16/2010  . Sulfonamide derivatives Rash      Family History  Problem Relation Age of Onset  . Heart failure Mother   . Heart failure Father   . Cancer Sister     Social History   Socioeconomic History  . Marital status: Divorced    Spouse name: Not on file  . Number of children: Not on file  . Years of education: Not on file  . Highest education level: Not on file  Occupational History  . Not on file  Tobacco Use  . Smoking status: Former Smoker    Packs/day: 1.00    Years: 10.00    Pack years: 10.00    Types: Cigarettes    Start date: 10/15/1943    Quit date: 01/28/1954    Years since quitting: 65.5  . Smokeless tobacco: Never Used  . Tobacco comment: tobacco use - no  Vaping Use  . Vaping Use: Never used  Substance and Sexual Activity  . Alcohol use: No    Alcohol/week: 0.0 standard drinks  . Drug use: No  . Sexual activity: Not on file  Other Topics Concern  . Not on file  Social History Narrative  . Not on file   Social Determinants of Health   Financial Resource Strain:   . Difficulty of Paying Living Expenses:   Food Insecurity:   . Worried About Charity fundraiser in the Last Year:   . Arboriculturist in the Last Year:   Transportation Needs:   . Film/video editor (Medical):   Marland Kitchen Lack of Transportation (Non-Medical):   Physical Activity:   . Days of Exercise per Week:   . Minutes of Exercise per Session:   Stress:   . Feeling of Stress :   Social Connections:   . Frequency of Communication with Friends and Family:   . Frequency of Social Gatherings with Friends and Family:   . Attends Religious Services:   . Active Member of Clubs or Organizations:   .  Attends Archivist Meetings:   Marland Kitchen Marital Status:   Intimate Partner Violence:   . Fear of Current or Ex-Partner:   . Emotionally Abused:   Marland Kitchen Physically Abused:   . Sexually Abused:     Review of Systems: See HPI, otherwise normal ROS  Physical Exam: Temp:  [98.3 F (36.8 C)] 98.3 F (36.8 C) (06/22 0956) Pulse  Rate:  [57-70] 59 (06/22 1628) Resp:  [10-22] 18 (06/22 1628) BP: (103-137)/(68-119) 130/77 (06/22 1628) SpO2:  [92 %-97 %] 95 % (06/22 1628) Weight:  [83 kg] 83 kg (06/22 0955)   Patient is alert and in no acute distress. Conjunctiva is pink.  Sclera is nonicteric. Oropharyngeal mucosa is normal.  He has upper lower dentures in place. No neck masses or thyromegaly noted. He has a pacemaker in left pectoral region. Cardiac exam with regular rhythm normal S1 and S2.  No murmur gallop noted. Auscultation lungs reveal vesicular breath sounds bilaterally.  No rales or rhonchi noted. Abdomen is full with bilateral inguinal hernia scars, appendectomy scar as well as supraumbilical scar.  Bowel sounds are hyperactive.  On palpation abdomen is soft and nontender with no organomegaly or masses. He has 1+ pitting edema involving the right leg and 2+ at left leg.   Lab Results: Recent Labs    07/20/19 1015  WBC 6.6  HGB 10.8*  HCT 34.1*  PLT 177   BMET Recent Labs    07/20/19 1015  NA 137  K 3.9  CL 104  CO2 23  GLUCOSE 124*  BUN 12  CREATININE 1.09  CALCIUM 9.1   LFT Recent Labs    07/20/19 1015  PROT 6.5  ALBUMIN 3.5  AST 16  ALT 11  ALKPHOS 52  BILITOT 1.0   PT/INR Recent Labs    07/20/19 1015  LABPROT 20.2*  INR 1.8*    Assessment;  Patient is 86 year old Caucasian male with history of atrial fibrillation who has permanent pacemaker who is on anticoagulant who presents with painless hematochezia.  Rectal bleeding started 5 days ago and has been stuttering or intermittent.  He is hemodynamically stable.  Hemoglobin is low and it is unclear whether he has anemia of chronic disease or drop is merely due to GI bleed.  He has a history of multiple colonic adenomas discovered on his colonoscopy in December 2019 when he presented with rectal bleeding.  He also has colonic diverticulosis. I suspect we are dealing with colonic diverticular bleed but need to rule out  recurrent polyps or neoplasm. Last Xarelto dose was this morning.  It is now on hold.  Recommendations;  Serial H&H's. Clear liquids today. Possible colonoscopy for diagnostic and therapeutic purposes on 07/21/2019. Will evaluate patient tomorrow morning before prep begun.   LOS: 0 days   Iceis Knab  07/20/2019, 5:40 PM

## 2019-07-20 NOTE — ED Notes (Signed)
ED TO INPATIENT HANDOFF REPORT  ED Nurse Name and Phone #:   S Name/Age/Gender [name] 86 y.o. male Room/Bed: APA11/APA11  Code Status   Code Status: Full Code  Home/SNF/Other Home Patient oriented to: self, place, time and situation Is this baseline? Yes   Triage Complete: Triage complete  Chief Complaint GI bleed [K92.2]  Triage Note Pt reports rectal bleeding since Thursday.  Describes stool as "grape jelly."  Denies any pain or dizziness.    Allergies Allergies  Allergen Reactions   Flexeril [Cyclobenzaprine Hcl] Nausea And Vomiting   Percocet [Oxycodone-Acetaminophen] Nausea And Vomiting   Vicodin [Hydrocodone-Acetaminophen] Nausea And Vomiting   Sulfonamide Derivatives Rash    Level of Care/Admitting Diagnosis ED Disposition    ED Disposition Condition [place] Area: [facility] [[id number]]  Level of Care: Telemetry [5]  Covid Evaluation: Asymptomatic Screening Protocol (No Symptoms)  Diagnosis: GI bleed [993716]  Admitting Physician: Rodena Goldmann [[id number]]  Attending Physician: Rodena Goldmann [[id number]]       B Medical/Surgery History Past Medical History:  Diagnosis Date   AAA (abdominal aortic aneurysm) ([place])    Atrial fibrillation ([place])    bicarbonate perfusion study 10/11 EF 69%. small defects no ischemia. 7 metastases acheived. ER visit 11/10/09 ruled out MI normal sinus rhythm orthostasis after sublingual nitroglycerin    BPH (benign prostatic hyperplasia)    Chronic back pain    GERD (gastroesophageal reflux disease)    Hypertension    Orthostatic hypotension    PONV (postoperative nausea and vomiting)    Sleep apnea    CPAP at night   Stress fracture of foot    left   Swelling of both lower extremities    Past Surgical History:  Procedure Laterality Date   CHOLECYSTECTOMY  2012   [facility] by Dr. Geroge Baseman   COLONOSCOPY N/A 08/27/2012   Procedure: COLONOSCOPY;  Surgeon: Rogene Houston, MD;  Location: [facility] ENDO SU[facility];  Service: Endoscopy;  Laterality: N/A;  325   COLONOSCOPY N/A 01/12/2018   Procedure: COLONOSCOPY;  Surgeon: Rogene Houston, MD;  Location: [facility] ENDO SUITE;  Service: Endoscopy;  Laterality: N/A;  Has staff Christmas party at his house at lunch today per office   HERNIA REPAIR Bilateral    Inguinal and umbilical   INCISIONAL HERNIA REPAIR N/A 01/31/2014   Procedure: Fatima Blank HERNIORRHAPHY WITH MESH;  Surgeon: Jamesetta So, MD;  Location: [facility] ORS;  Service: General;  Laterality: N/A;   INSERTION OF MESH N/A 01/31/2014   Procedure: INSERTION OF MESH;  Surgeon: Jamesetta So, MD;  Location: [facility] ORS;  Service: General;  Laterality: N/A;   PACEMAKER IMPLANT N/A 12/02/2017   [employer]urity MRI conditional  dual-chamber pacemaker by Dr Rayann Heman for symptomatic sinus bradycardia   POLYPECTOMY  01/12/2018   Procedure: POLYPECTOMY;  Surgeon: Rogene Houston, MD;  Location: [facility] ENDO SUITE;  Service: Endoscopy;;  transverse colon polyp, ascending colon polyps x2, hepatic flexure polyps x2    SHOULDER SURGERY Right 2005?   for open rotator cuff repair. [place]   WOUND DEBRIDEMENT Right 01/31/2014   Procedure: DEBRIDEMENT WOUND RIGHT LEG;  Surgeon: Jamesetta So, MD;  Location: [facility] ORS;  Service: General;  Laterality: Right;   WRIST FRACTURE SURGERY Right      A IV Location/Drains/Wounds Patient Lines/Drains/Airways Status    Active Line/Drains/Airways    Name Placement date Placement time Site Days   Peripheral IV  07/20/19 Left;Lateral Forearm 07/20/19  1031  Forearm  less than 1   Incision (Closed) 01/31/14 Abdomen Other (Comment) 01/31/14  0819   1996   Incision (Closed) 01/31/14 Leg Right 01/31/14  0819   1996   Wound / Incision (Open or Dehisced) 12/02/17 Chest Left PACER SITE 12/02/17  --  Chest  595          Intake/Output Last 24 hours  Intake/Output Summary (Last 24 hours) at 07/20/2019 1551 Last data filed at 07/20/2019  1316 Gross per 24 hour  Intake 500 ml  Output 790 ml  Net -290 ml    Labs/Imaging Results for orders placed or performed during the hospital encounter of 07/20/19 (from the past 48 hour(s))  Comprehensive metabolic panel     Status: Abnormal   Collection Time: 07/20/19 10:15 AM  Result Value Ref Range   Sodium 137 135 - 145 mmol/L   Potassium 3.9 3.5 - 5.1 mmol/L   Chloride 104 98 - 111 mmol/L   CO2 23 22 - 32 mmol/L   Glucose, Bld 124 (H) 70 - 99 mg/dL    Comment: Glucose reference range applies only to samples taken after fasting for at least 8 hours.   BUN 12 8 - 23 mg/dL   Creatinine, Ser 1.09 0.61 - 1.24 mg/dL   Calcium 9.1 8.9 - 10.3 mg/dL   Total Protein 6.5 6.5 - 8.1 g/dL   Albumin 3.5 3.5 - 5.0 g/dL   AST 16 15 - 41 U/L   ALT 11 0 - 44 U/L   Alkaline Phosphatase 52 38 - 126 U/L   Total Bilirubin 1.0 0.3 - 1.2 mg/dL   GFR calc non Af Amer 59 (L) >60 mL/min   GFR calc Af Amer >60 >60 mL/min   Anion gap 10 5 - 15    Comment: Performed at [facility], [address] [place], [place] [address]  CBC     Status: Abnormal   Collection Time: 07/20/19 10:15 AM  Result Value Ref Range   WBC 6.6 4.0 - 10.5 K/uL   RBC 3.73 (L) 4.22 - 5.81 MIL/uL   Hemoglobin 10.8 (L) 13.0 - 17.0 g/dL   HCT 34.1 (L) 39 - 52 %   MCV 91.4 80.0 - 100.0 fL   MCH 29.0 26.0 - 34.0 pg   MCHC 31.7 30.0 - 36.0 g/dL   RDW 13.2 11.5 - 15.5 %   Platelets 177 150 - 400 K/uL   nRBC 0.0 0.0 - 0.2 %    Comment: Performed at [facility], [address], [place], [place] [address]  Type and screen [facility]     Status: None   Collection Time: 07/20/19 10:15 AM  Result Value Ref Range   ABO/RH(D) AB POS    Antibody Screen NEG    Sample Expiration      07/23/2019,2359 Performed at [facility], [address] [place], [place] [address]   Protime-INR     Status: Abnormal   Collection Time: 07/20/19 10:15 AM  Result Value Ref Range   Prothrombin Time 20.2 (H) 11.4 - 15.2 seconds   INR 1.8  (H) 0.8 - 1.2    Comment: (NOTE) INR goal varies based on device and disease states. Performed at [facility], [address], [place], [place] [address]   APTT     Status: Abnormal   Collection Time: 07/20/19 10:15 AM  Result Value Ref Range   aPTT 39 (H) 24 - 36 seconds  Comment:        IF BASELINE aPTT IS ELEVATED, SUGGEST PATIENT RISK ASSESSMENT BE USED TO DETERMINE APPROPRIATE ANTICOAGULANT THERAPY. Performed at [facility], [address], [place], [place] [address]   Vitamin B12     Status: None   Collection Time: 07/20/19 10:15 AM  Result Value Ref Range   Vitamin B-12 207 180 - 914 pg/mL    Comment: (NOTE) This assay is not validated for testing neonatal or myeloproliferative syndrome specimens for Vitamin B12 levels. Performed at [facility], [address], [place], [place] [address]   Iron and TIBC     Status: Abnormal   Collection Time: 07/20/19 10:15 AM  Result Value Ref Range   Iron 46 45 - 182 ug/dL   TIBC 361 250 - 450 ug/dL   Saturation Ratios 13 (L) 17.9 - 39.5 %   UIBC 315 ug/dL    Comment: Performed at [facility], [address] [place], [place] [address]  Ferritin     Status: Abnormal   Collection Time: 07/20/19 10:15 AM  Result Value Ref Range   Ferritin 10 (L) 24 - 336 ng/mL    Comment: Performed at [facility], [address], [place], [place] [address]  Reticulocytes     Status: Abnormal   Collection Time: 07/20/19 10:15 AM  Result Value Ref Range   Retic Ct Pct 1.2 0.4 - 3.1 %   RBC. 3.75 (L) 4.22 - 5.81 MIL/uL   Retic Count, Absolute 45.0 19.0 - 186.0 K/uL   Immature Retic Fract 11.7 2.3 - 15.9 %    Comment: Performed at [facility], [address], [place], [place] [address]  POC occult blood, ED     Status: Abnormal   Collection Time: 07/20/19 10:37 AM  Result Value Ref Range   Fecal Occult Bld POSITIVE (A) NEGATIVE  SARS Coronavirus 2 by RT PCR (hospital order, performed in [place] hospital lab) Nasopharyngeal Nasopharyngeal  Swab     Status: None   Collection Time: 07/20/19  1:20 PM   Specimen: Nasopharyngeal Swab  Result Value Ref Range   SARS Coronavirus 2 NEGATIVE NEGATIVE    Comment: (NOTE) SARS-CoV-2 target nucleic acids are NOT DETECTED.  The SARS-CoV-2 RNA is generally detectable in upper and lower respiratory specimens during the acute phase of infection. The lowest concentration of SARS-CoV-2 viral copies this assay can detect is 250 copies / mL. A negative result does not preclude SARS-CoV-2 infection and should not be used as the sole basis for treatment or other patient management decisions.  A negative result may occur with improper specimen collection / handling, submission of specimen other than nasopharyngeal swab, presence of viral mutation(s) within the areas targeted by this assay, and inadequate number of viral copies (<250 copies / mL). A negative result must be combined with clinical observations, patient history, and epidemiological information.  Fact Sheet for Patients:   StrictlyIdeas.no  Fact Sheet for Healthcare Providers: BankingDealers.co.za  This test is not yet approved or  cleared by the [place] FDA and has been authorized for detection and/or diagnosis of SARS-CoV-2 by FDA under an Emergency Use Authorization (EUA).  This EUA will remain in effect (meaning this test can be used) for the duration of the COVID-19 declaration under Section 564(b)(1) of the Act, 21 U.S.C. section 360bbb-3(b)(1), unless the authorization is terminated or revoked sooner.  Performed at [facility], [address], [place], [place] [address]    No results found.  Pending Labs [employer] (  From admission, onward) Comment          Start     Ordered   07/21/19 0500  Comprehensive metabolic panel  Tomorrow morning,   R        07/20/19 1336   07/21/19 0500  CBC  Tomorrow morning,   R        07/20/19 1336   07/20/19 1333  Folate   (Anemia Panel (PNL))  Once,   STAT        07/20/19 1332          Vitals/Pain Today's Vitals   07/20/19 1422 07/20/19 1432 07/20/19 1500 07/20/19 1530  BP: 118/73 119/73 122/68 120/77  Pulse: 63 (!) 59 (!) 59 63  Resp: 19 15 19 10   Temp:      TempSrc:      SpO2: 94% 92% 94% 92%  Weight:      Height:      PainSc:        Isolation Precautions No active isolations  Medications Medications  pantoprazole (PROTONIX) injection 40 mg (40 mg Intravenous Given 07/20/19 1432)  amLODipine (NORVASC) tablet 2.5 mg (has no administration in time range)  metoprolol tartrate (LOPRESSOR) tablet 12.5 mg (has no administration in time range)  alfuzosin (UROXATRAL) 24 hr tablet 10 mg (has no administration in time range)  finasteride (PROSCAR) tablet 5 mg (has no administration in time range)  dorzolamide (TRUSOPT) 2 % ophthalmic solution 1 drop (has no administration in time range)  latanoprost (XALATAN) 0.005 % ophthalmic solution 1 drop (has no administration in time range)  lactated ringers infusion ( Intravenous New Bag/Given 07/20/19 1429)  ondansetron (ZOFRAN) tablet 4 mg (has no administration in time range)    Or  ondansetron (ZOFRAN) injection 4 mg (has no administration in time range)  sodium chloride 0.9 % bolus 500 mL (0 mLs Intravenous Stopped 07/20/19 1316)    Mobility  Low fall risk   Focused Assessments    R Recommendations: See Admitting Provider Note  Report given to:   Additional Notes:

## 2019-07-21 ENCOUNTER — Encounter (HOSPITAL_COMMUNITY): Payer: Self-pay | Admitting: Internal Medicine

## 2019-07-21 ENCOUNTER — Encounter (HOSPITAL_COMMUNITY)
Admission: EM | Disposition: A | Discharge status: Home / Self Care | Payer: Self-pay | Source: Home / Self Care | Attending: Emergency Medicine

## 2019-07-21 DIAGNOSIS — I480 Paroxysmal atrial fibrillation: Secondary | ICD-10-CM | POA: Diagnosis not present

## 2019-07-21 DIAGNOSIS — K5730 Diverticulosis of large intestine without perforation or abscess without bleeding: Secondary | ICD-10-CM | POA: Diagnosis not present

## 2019-07-21 DIAGNOSIS — K648 Other hemorrhoids: Secondary | ICD-10-CM

## 2019-07-21 DIAGNOSIS — K625 Hemorrhage of anus and rectum: Secondary | ICD-10-CM | POA: Diagnosis not present

## 2019-07-21 DIAGNOSIS — I714 Abdominal aortic aneurysm, without rupture: Secondary | ICD-10-CM

## 2019-07-21 DIAGNOSIS — K922 Gastrointestinal hemorrhage, unspecified: Secondary | ICD-10-CM | POA: Diagnosis not present

## 2019-07-21 DIAGNOSIS — D122 Benign neoplasm of ascending colon: Secondary | ICD-10-CM | POA: Diagnosis not present

## 2019-07-21 HISTORY — PX: POLYPECTOMY: SHX5525

## 2019-07-21 HISTORY — PX: COLONOSCOPY: SHX5424

## 2019-07-21 LAB — COMPREHENSIVE METABOLIC PANEL
ALT: 10 U/L (ref 0–44)
AST: 16 U/L (ref 15–41)
Albumin: 3.1 g/dL — ABNORMAL LOW (ref 3.5–5.0)
Alkaline Phosphatase: 47 U/L (ref 38–126)
Anion gap: 9 (ref 5–15)
BUN: 10 mg/dL (ref 8–23)
CO2: 25 mmol/L (ref 22–32)
Calcium: 8.9 mg/dL (ref 8.9–10.3)
Chloride: 105 mmol/L (ref 98–111)
Creatinine, Ser: 0.94 mg/dL (ref 0.61–1.24)
GFR calc Af Amer: >60 mL/min (ref >60)
GFR calc non Af Amer: >60 mL/min (ref >60)
Glucose, Bld: 102 mg/dL — ABNORMAL HIGH (ref 70–99)
Potassium: 3.6 mmol/L (ref 3.5–5.1)
Sodium: 139 mmol/L (ref 135–145)
Total Bilirubin: 1.3 mg/dL — ABNORMAL HIGH (ref 0.3–1.2)
Total Protein: 5.8 g/dL — ABNORMAL LOW (ref 6.5–8.1)

## 2019-07-21 LAB — CBC
HCT: 30.8 % — ABNORMAL LOW (ref 39.0–52.0)
Hemoglobin: 9.9 g/dL — ABNORMAL LOW (ref 13.0–17.0)
MCH: 29.3 pg (ref 26.0–34.0)
MCHC: 32.1 g/dL (ref 30.0–36.0)
MCV: 91.1 fL (ref 80.0–100.0)
Platelets: 164 10*3/uL (ref 150–400)
RBC: 3.38 MIL/uL — ABNORMAL LOW (ref 4.22–5.81)
RDW: 13 % (ref 11.5–15.5)
WBC: 5.9 10*3/uL (ref 4.0–10.5)
nRBC: 0 % (ref 0.0–0.2)

## 2019-07-21 LAB — MRSA PCR SCREENING: MRSA by PCR: POSITIVE — AB

## 2019-07-21 SURGERY — COLONOSCOPY
Anesthesia: Moderate Sedation

## 2019-07-21 MED ORDER — CHLORHEXIDINE GLUCONATE CLOTH 2 % EX PADS: 6.0000 | MEDICATED_PAD | Freq: Every day | CUTANEOUS | Status: DC

## 2019-07-21 MED ORDER — MIDAZOLAM HCL 5 MG/5ML IJ SOLN
INTRAMUSCULAR | Status: DC | PRN
  Administered 2019-07-21 (×2): 1 mg via INTRAVENOUS

## 2019-07-21 MED ORDER — SODIUM CHLORIDE 0.9 % IV SOLN
INTRAVENOUS | Status: DC
  Administered 2019-07-21: 1000 mL via INTRAVENOUS

## 2019-07-21 MED ORDER — MIDAZOLAM HCL 5 MG/5ML IJ SOLN
INTRAMUSCULAR | Status: AC
  Filled 2019-07-21: qty 10

## 2019-07-21 MED ORDER — PEG 3350-KCL-NA BICARB-NACL 420 G PO SOLR
4000.0000 mL | Freq: Once | ORAL | Status: AC
  Administered 2019-07-21: 4000 mL via ORAL

## 2019-07-21 MED ORDER — MEPERIDINE HCL 50 MG/ML IJ SOLN
INTRAMUSCULAR | Status: AC
  Filled 2019-07-21: qty 1

## 2019-07-21 MED ORDER — FENTANYL CITRATE (PF) 100 MCG/2ML IJ SOLN
INTRAMUSCULAR | Status: DC | PRN
  Administered 2019-07-21: 20 ug via INTRAVENOUS

## 2019-07-21 MED ORDER — STERILE WATER FOR IRRIGATION IR SOLN
Status: DC | PRN
  Administered 2019-07-21: 1.5 mL

## 2019-07-21 MED ORDER — FENTANYL CITRATE (PF) 100 MCG/2ML IJ SOLN
INTRAMUSCULAR | Status: AC
  Filled 2019-07-21: qty 2

## 2019-07-21 MED ORDER — MUPIROCIN 2 % EX OINT
1.0000 "application " | TOPICAL_OINTMENT | Freq: Two times a day (BID) | CUTANEOUS | Status: DC
  Administered 2019-07-21 – 2019-07-22 (×2): 1 via NASAL
  Filled 2019-07-21: qty 22

## 2019-07-21 NOTE — Op Note (Signed)
[facility] Patient Name: [name] Procedure Date: 07/21/2019 1:23 PM MRN: [id number] Date of Birth: August 12, 1926 Attending MD: Hildred Laser , MD CSN: [id number] Age: 86 Admit Type: Inpatient Procedure:                Colonoscopy Indications:              Rectal bleeding Providers:                Hildred Laser, MD, Jeanann Lewandowsky. Sharon Seller, RN, Nelma Rothman, Technician Referring MD:             Heath Lark, DO Medicines:                Fentanyl 20 micrograms IV, Midazolam 2 mg IV Complications:            No immediate complications. Estimated Blood Loss:     Estimated blood loss was minimal. Procedure:                Pre-Anesthesia Assessment:                           - Prior to the procedure, a History and Physical                            was performed, and patient medications and                            allergies were reviewed. The patient's tolerance of                            previous anesthesia was also reviewed. The risks                            and benefits of the procedure and the sedation                            options and risks were discussed with the patient.                            All questions were answered, and informed consent                            was obtained. Prior Anticoagulants: The patient                            last took Xarelto (rivaroxaban) 1 day prior to the                            procedure. ASA Grade Assessment: III - A patient                            with severe systemic disease. After reviewing the  risks and benefits, the patient was deemed in                            satisfactory condition to undergo the procedure.                           After obtaining informed consent, the colonoscope                            was passed under direct vision. Throughout the                            procedure, the patient's blood pressure, pulse, and                             oxygen saturations were monitored continuously. The                            [id number]-[id number] ([id number]) scope was introduced through                            the anus and advanced to the the terminal ileum,                            with identification of the appendiceal orifice and                            IC valve. The colonoscopy was performed without                            difficulty. The patient tolerated the procedure                            well. The quality of the bowel preparation was                            excellent. The terminal ileum, ileocecal valve,                            appendiceal orifice, and rectum were photographed. Scope In: 1:40:10 PM Scope Out: 1:56:05 PM Scope Withdrawal Time: 0 hours 11 minutes 35 seconds  Total Procedure Duration: 0 hours 15 minutes 55 seconds  Findings:      The perianal and digital rectal examinations were normal.      The terminal ileum appeared normal.      A diminutive polyp was found in the proximal ascending colon. The polyp       was sessile. Biopsies were taken with a cold forceps for histology.      A single medium-mouthed diverticulum was found in the hepatic flexure.      Multiple diverticula were found in the sigmoid colon.      Internal hemorrhoids were found during retroflexion. The hemorrhoids       were small. Impression:               - The examined portion  of the ileum was normal.                           - One diminutive polyp in the proximal ascending                            colon. Biopsied.                           - Diverticulosis at the hepatic flexure.                           - Diverticulosis in the sigmoid colon.                           - Internal hemorrhoids.                           Comment: no evidence of residual polyp/recurrent                            polyp at hepatic flexure.                           diverticular bleed. Moderate Sedation:      Moderate (conscious) sedation was  administered by the endoscopy nurse       and supervised by the endoscopist. The following parameters were       monitored: oxygen saturation, heart rate, blood pressure, CO2       capnography and response to care. Total physician intraservice time was       19 minutes. Recommendation:           - Patient has a contact number available for                            emergencies. The signs and symptoms of potential                            delayed complications were discussed with the                            patient. Return to normal activities tomorrow.                            Written discharge instructions were provided to the                            patient.                           - Low sodium diet today.                           - Continue present medications.                           - Await pathology results.                           -  Hold Xarelto for another 72 hours.                           - No repeat colonoscopy. Procedure Code(s):        --- Professional ---                           [phone], Colonoscopy, flexible; with biopsy, single                            or multiple                           G0500, Moderate sedation services provided by the                            same physician or other qualified health care                            professional performing a gastrointestinal                            endoscopic service that sedation supports,                            requiring the presence of an independent trained                            observer to assist in the monitoring of the                            patient's level of consciousness and physiological                            status; initial 15 minutes of intra-service time;                            patient age 43 years or older (additional time may                            be reported with [phone], as appropriate) Diagnosis Code(s):        --- Professional ---                            K64.8, Other hemorrhoids                           K63.5, Polyp of colon                           K62.5, Hemorrhage of anus and rectum                           K57.30, Diverticulosis of large intestine without  perforation or abscess without bleeding CPT copyright 2019 American Medical Association. All rights reserved. The codes documented in this report are preliminary and upon coder review may  be revised to meet current compliance requirements. Hildred Laser, MD Hildred Laser, MD 07/21/2019 2:09:44 PM This report has been signed electronically. Number of Addenda: 0

## 2019-07-21 NOTE — Progress Notes (Signed)
Brief colonoscopy note  Normal terminal ileum. Small polyp at ascending colon ablated via cold biopsy. Diverticulum at hepatic flexure without stigmata of bleed. Multiple diverticula at sigmoid colon without stigmata of bleed. Internal hemorrhoids without stigmata of bleed.  Suspect colonic diverticular bleed which has stopped spontaneously.

## 2019-07-21 NOTE — Plan of Care (Signed)
  Problem: Education: Goal: Knowledge of General Education information will improve Description Including pain rating scale, medication(s)/side effects and non-pharmacologic comfort measures Outcome: Progressing   Problem: Health Behavior/Discharge Planning: Goal: Ability to manage health-related needs will improve Outcome: Progressing   

## 2019-07-21 NOTE — Care Management Obs Status (Signed)
[place] NOTIFICATION   Patient Details  Name: [name] MRN: [id number] Date of Birth: 1926-11-18   Medicare Observation Status Notification Given:  Yes    [name] 07/21/2019, 3:59 PM

## 2019-07-21 NOTE — Care Management Important Message (Deleted)
Important Message  Patient Details  Name: [name] MRN: [id number] Date of Birth: 08-10-1926   Medicare Important Message Given:  Yes     [name] 07/21/2019, 3:58 PM

## 2019-07-21 NOTE — Progress Notes (Signed)
Subjective:  Patient has no complaints.  He states he has been passing flatus but has not passed any more blood per rectum.  He denies abdominal pain nausea vomiting chest pain or shortness of breath.  Current Medications:  Current Facility-Administered Medications:  .  alfuzosin (UROXATRAL) 24 hr tablet 10 mg, 10 mg, Oral, Q breakfast, Manuella Ghazi, Pratik D, DO .  amLODipine (NORVASC) tablet 2.5 mg, 2.5 mg, Oral, Daily, Manuella Ghazi, Pratik D, DO .  dorzolamide (TRUSOPT) 2 % ophthalmic solution 1 drop, 1 drop, Right Eye, BID, Manuella Ghazi, Pratik D, DO, 1 drop at 07/20/19 2045 .  finasteride (PROSCAR) tablet 5 mg, 5 mg, Oral, Daily, Shah, Pratik D, DO .  latanoprost (XALATAN) 0.005 % ophthalmic solution 1 drop, 1 drop, Both Eyes, QHS, Shah, Pratik D, DO, 1 drop at 07/20/19 2045 .  metoprolol tartrate (LOPRESSOR) tablet 12.5 mg, 12.5 mg, Oral, BID, Manuella Ghazi, Pratik D, DO, 12.5 mg at 07/20/19 2044 .  ondansetron (ZOFRAN) tablet 4 mg, 4 mg, Oral, Q6H PRN **OR** ondansetron (ZOFRAN) injection 4 mg, 4 mg, Intravenous, Q6H PRN, Manuella Ghazi, Pratik D, DO .  pantoprazole (PROTONIX) injection 40 mg, 40 mg, Intravenous, Q24H, Shah, Pratik D, DO, 40 mg at 07/20/19 1432 .  polyethylene glycol-electrolytes (NuLYTELY) solution 4,000 mL, 4,000 mL, Oral, Once, Quinten Allerton U, MD   Objective: Blood pressure 125/83, pulse 60, temperature 98.4 F (36.9 C), temperature source Oral, resp. rate 20, height _0  (1.727 m), weight 83 kg, SpO2 95 %. Patient is alert and in no acute distress. Cardiac exam with regular rhythm normal S1 and S2. No murmur or gallop noted. Lungs are clear to auscultation. Abdomen is full.  Bowel sounds are normal.  On palpation soft and nontender with no organomegaly or masses. No LE edema or clubbing noted.  Labs/studies Results:  CBC Latest Ref Rng & Units 07/21/2019 07/20/2019 01/13/2018  WBC 4.0 - 10.5 K/uL 5.9 6.6 9.3  Hemoglobin 13.0 - 17.0 g/dL 9.9(L) 10.8(L) 10.7(L)  Hematocrit 39 - 52 % 30.8(L) 34.1(L)  33.2(L)  Platelets 150 - 400 K/uL 164 177 153    CMP Latest Ref Rng & Units 07/21/2019 07/20/2019 01/11/2018  Glucose 70 - 99 mg/dL 102(H) 124(H) 113(H)  BUN 8 - 23 mg/dL _1 Creatinine 0.61 - 1.24 mg/dL 0.94 1.09 0.89  Sodium 135 - 145 mmol/L 139 137 140  Potassium 3.5 - 5.1 mmol/L 3.6 3.9 3.9  Chloride 98 - 111 mmol/L 105 104 109  CO2 22 - 32 mmol/L _2 Calcium 8.9 - 10.3 mg/dL 8.9 9.1 9.1  Total Protein 6.5 - 8.1 g/dL 5.8(L) 6.5 6.1(L)  Total Bilirubin 0.3 - 1.2 mg/dL 1.3(H) 1.0 0.9  Alkaline Phos 38 - 126 U/L 47 52 48  AST 15 - 41 U/L _3 ALT 0 - 44 U/L _4 Hepatic Function Latest Ref Rng & Units 07/21/2019 07/20/2019 01/11/2018  Total Protein 6.5 - 8.1 g/dL 5.8(L) 6.5 6.1(L)  Albumin 3.5 - 5.0 g/dL 3.1(L) 3.5 3.3(L)  AST 15 - 41 U/L _5 ALT 0 - 44 U/L _6 Alk Phosphatase 38 - 126 U/L 47 52 48  Total Bilirubin 0.3 - 1.2 mg/dL 1.3(H) 1.0 0.9  Bilirubin, Direct 0.1 - 0.5 mg/dL - - -     Assessment:  #1.  Rectal bleeding.  Patient has known colonic diverticulosis he also had multiple adenomas removed at 18 months ago.  He remains to be seen  if bleeding is secondary to diverticulosis AV malformation recurrent polyps.  To be as patient has stopped bleeding.  He is hemodynamically stable.  Last Xarelto dose was yesterday.  #2.  Anemia.  Starting hemoglobin was 10.8.  It has dropped to 9.9 g.  Is unclear as to baseline.   Recommendations  Colonoscopy later today. Patient will be prepped with GoLYTELY.

## 2019-07-21 NOTE — Progress Notes (Signed)
PROGRESS NOTE    [name]  NWG:[id number] DOB: 1926-10-28 DOA: 07/20/2019 PCP: Curlene Labrum, MD    Brief Narrative:  [name] is a 86 y.o. male with medical history significant for atrial fibrillation on Xarelto, BPH, GERD, hypertension, and noted diverticulosis as well as internal hemorrhoids on prior colonoscopy 12/2017 who presented to the ED with persistent rectal bleeding over the past 5 days.  He states that he is noted quite a bit of blood on his stool and toilet bowl.  His stools have appeared to look like grape jelly.  The episodes became more frequent this a.m. with 3 episodes noted around 3 AM.  He denies any abdominal pain, nausea, vomiting, fever, chills, or any other symptoms.  He denies any lightheadedness or dizziness.  He takes Xarelto regularly and took last dose this morning.   Assessment & Plan:   Active Problems:   GI bleed   1. Rectal bleeding.  Suspect this related to diverticulosis in the setting of anticoagulation.  Xarelto currently on hold.  GI following and plans on colonoscopy later today.  He is currently undergoing prep. 2. Normocytic anemia.  Mild decrease in hemoglobin since admission.  No signs of ongoing bleeding.  Continue to follow. 3. Hypertension.  Continue on metoprolol and amlodipine.  Blood pressure currently stable. 4. GERD.  Continue on PPI 5. Paroxysmal atrial fibrillation.  Continue Lopressor for rate control.  Plans to resume Xarelto once cleared by GI.   DVT prophylaxis: SCDs Start: 07/20/19 1335  Code Status: Full code Family Communication: Discussed with wife at the bedside Disposition Plan: Status is: Observation  The patient remains OBS appropriate and will d/c before 2 midnights.  Dispo: The patient is from: Home              Anticipated d/c is to: Home              Anticipated d/c date is: 1 day              Patient currently is not medically stable to d/c.   Consultants:    Gastroenterology  Procedures:     Antimicrobials:       Subjective: No further bloody bowel movements.  No abdominal pain.  Objective: Vitals:   07/21/19 1400 07/21/19 1402 07/21/19 1405 07/21/19 1940  BP: 104/62 104/62 99/61   Pulse: 60 (!) 59 (!) 59   Resp: 16 14 14    Temp:      TempSrc:      SpO2: 98% 97% 97% 97%  Weight:      Height:        Intake/Output Summary (Last 24 hours) at 07/21/2019 2002 Last data filed at 07/21/2019 1600 Gross per 24 hour  Intake 54.33 ml  Output 700 ml  Net -645.67 ml   Filed Weights   07/20/19 0955 07/21/19 1302  Weight: 83 kg 82.6 kg    Examination:  General exam: Appears calm and comfortable  Respiratory system: Clear to auscultation. Respiratory effort normal. Cardiovascular system: S1 & S2 heard, RRR. No JVD, murmurs, rubs, gallops or clicks. No pedal edema. Gastrointestinal system: Abdomen is nondistended, soft and nontender. No organomegaly or masses felt. Normal bowel sounds heard. Central nervous system: Alert and oriented. No focal neurological deficits. Extremities: Symmetric 5 x 5 power. Skin: No rashes, lesions or ulcers Psychiatry: Judgement and insight appear normal. Mood & affect appropriate.     Data Reviewed: I have personally reviewed following labs and imaging studies  CBC: Recent Labs  Lab 07/20/19 1015 07/21/19 0622  WBC 6.6 5.9  HGB 10.8* 9.9*  HCT 34.1* 30.8*  MCV 91.4 91.1  PLT 177 932   Basic Metabolic Panel: Recent Labs  Lab 07/20/19 1015 07/21/19 0622  NA 137 139  K 3.9 3.6  CL 104 105  CO2 23 25  GLUCOSE 124* 102*  BUN 12 10  CREATININE 1.09 0.94  CALCIUM 9.1 8.9   GFR: Estimated Creatinine Clearance: 54.7 mL/min (by C-G formula based on SCr of 0.94 mg/dL). Liver Function Tests: Recent Labs  Lab 07/20/19 1015 07/21/19 0622  AST 16 16  ALT 11 10  ALKPHOS 52 47  BILITOT 1.0 1.3*  PROT 6.5 5.8*  ALBUMIN 3.5 3.1*   No results for input(s): LIPASE, AMYLASE in the last  168 hours. No results for input(s): AMMONIA in the last 168 hours. Coagulation Profile: Recent Labs  Lab 07/20/19 1015  INR 1.8*   Cardiac Enzymes: No results for input(s): CKTOTAL, CKMB, CKMBINDEX, TROPONINI in the last 168 hours. BNP (last 3 results) No results for input(s): PROBNP in the last 8760 hours. HbA1C: No results for input(s): HGBA1C in the last 72 hours. CBG: No results for input(s): GLUCAP in the last 168 hours. Lipid Profile: No results for input(s): CHOL, HDL, LDLCALC, TRIG, CHOLHDL, LDLDIRECT in the last 72 hours. Thyroid Function Tests: No results for input(s): TSH, T4TOTAL, FREET4, T3FREE, THYROIDAB in the last 72 hours. Anemia Panel: Recent Labs    07/20/19 1015  VITAMINB12 207  FOLATE 23.9  FERRITIN 10*  TIBC 361  IRON 46  RETICCTPCT 1.2   Sepsis Labs: No results for input(s): PROCALCITON, LATICACIDVEN in the last 168 hours.  Recent Results (from the past 240 hour(s))  SARS Coronavirus 2 by RT PCR (hospital order, performed in [facility] hospital lab) Nasopharyngeal Nasopharyngeal Swab     Status: None   Collection Time: 07/20/19  1:20 PM   Specimen: Nasopharyngeal Swab  Result Value Ref Range Status   SARS Coronavirus 2 NEGATIVE NEGATIVE Final    Comment: (NOTE) SARS-CoV-2 target nucleic acids are NOT DETECTED.  The SARS-CoV-2 RNA is generally detectable in upper and lower respiratory specimens during the acute phase of infection. The lowest concentration of SARS-CoV-2 viral copies this assay can detect is 250 copies / mL. A negative result does not preclude SARS-CoV-2 infection and should not be used as the sole basis for treatment or other patient management decisions.  A negative result may occur with improper specimen collection / handling, submission of specimen other than nasopharyngeal swab, presence of viral mutation(s) within the areas targeted by this assay, and inadequate number of viral copies (<250 copies / mL). A negative result  must be combined with clinical observations, patient history, and epidemiological information.  Fact Sheet for Patients:   StrictlyIdeas.no  Fact Sheet for Healthcare Providers: BankingDealers.co.za  This test is not yet approved or  cleared by the [place] FDA and has been authorized for detection and/or diagnosis of SARS-CoV-2 by FDA under an Emergency Use Authorization (EUA).  This EUA will remain in effect (meaning this test can be used) for the duration of the COVID-19 declaration under Section 564(b)(1) of the Act, 21 U.S.C. section 360bbb-3(b)(1), unless the authorization is terminated or revoked sooner.  Performed at [facility], [address], [place], [place] [address]   MRSA PCR Screening     Status: Abnormal   Collection Time: 07/21/19 10:24 AM   Specimen: Nasal Mucosa; Nasopharyngeal  Result Value  Ref Range Status   MRSA by PCR POSITIVE (A) NEGATIVE Final    Comment:        The GeneXpert MRSA Assay (FDA approved for NASAL specimens only), is one component of a comprehensive MRSA colonization surveillance program. It is not intended to diagnose MRSA infection nor to guide or monitor treatment for MRSA infections. RESULT CALLED TO, READ BACK BY AND VERIFIED WITH: BROWN S. AT 1353 ON [id number] BY THOMPSON S. Performed at [facility], [address] [place], [place] [address]          Radiology Studies: No results found.      Scheduled Meds: . alfuzosin  10 mg Oral Q breakfast  . amLODipine  2.5 mg Oral Daily  . [START ON 07/22/2019] Chlorhexidine Gluconate Cloth  6 each Topical Q0600  . dorzolamide  1 drop Right Eye BID  . fentaNYL      . finasteride  5 mg Oral Daily  . latanoprost  1 drop Both Eyes QHS  . metoprolol tartrate  12.5 mg Oral BID  . midazolam      . mupirocin ointment  1 application Nasal BID  . pantoprazole (PROTONIX) IV  40 mg Intravenous Q24H   Continuous Infusions:   LOS: 0  days    Time spent: 30mins    Kathie Dike, MD [facility]   If 7PM-7AM, please contact night-coverage [email or web address]  07/21/2019, 8:02 PM

## 2019-07-22 ENCOUNTER — Other Ambulatory Visit (INDEPENDENT_AMBULATORY_CARE_PROVIDER_SITE_OTHER): Payer: Self-pay | Admitting: *Deleted

## 2019-07-22 DIAGNOSIS — D649 Anemia, unspecified: Secondary | ICD-10-CM

## 2019-07-22 DIAGNOSIS — K625 Hemorrhage of anus and rectum: Secondary | ICD-10-CM | POA: Diagnosis not present

## 2019-07-22 DIAGNOSIS — K5730 Diverticulosis of large intestine without perforation or abscess without bleeding: Secondary | ICD-10-CM | POA: Diagnosis not present

## 2019-07-22 DIAGNOSIS — K922 Gastrointestinal hemorrhage, unspecified: Secondary | ICD-10-CM | POA: Diagnosis not present

## 2019-07-22 DIAGNOSIS — I480 Paroxysmal atrial fibrillation: Secondary | ICD-10-CM | POA: Diagnosis not present

## 2019-07-22 DIAGNOSIS — I714 Abdominal aortic aneurysm, without rupture: Secondary | ICD-10-CM | POA: Diagnosis not present

## 2019-07-22 DIAGNOSIS — D122 Benign neoplasm of ascending colon: Secondary | ICD-10-CM | POA: Diagnosis not present

## 2019-07-22 DIAGNOSIS — K648 Other hemorrhoids: Secondary | ICD-10-CM | POA: Diagnosis not present

## 2019-07-22 LAB — CBC
HCT: 29.1 % — ABNORMAL LOW (ref 39.0–52.0)
Hemoglobin: 9.2 g/dL — ABNORMAL LOW (ref 13.0–17.0)
MCH: 28.8 pg (ref 26.0–34.0)
MCHC: 31.6 g/dL (ref 30.0–36.0)
MCV: 90.9 fL (ref 80.0–100.0)
Platelets: 156 10*3/uL (ref 150–400)
RBC: 3.2 MIL/uL — ABNORMAL LOW (ref 4.22–5.81)
RDW: 13 % (ref 11.5–15.5)
WBC: 6.1 10*3/uL (ref 4.0–10.5)
nRBC: 0 % (ref 0.0–0.2)

## 2019-07-22 MED ORDER — RIVAROXABAN 20 MG PO TABS: ORAL_TABLET | ORAL | 6 refills | Status: DC

## 2019-07-22 NOTE — Progress Notes (Signed)
NURSING PROGRESS NOTE  BOHDI LEEDS [id number] Discharge Data: 07/22/2019 12:42 PM Attending Provider: Kathie Dike, MD [id number], Virgina Evener, MD     Lelan Pons to be D/C'd Home per MD order.  Discussed with the patient the After Visit Summary and all questions fully answered. All IV's discontinued with no bleeding noted. All belongings returned to patient for patient to take home.   Last Vital Signs:  Blood pressure 122/71, pulse (!) 59, temperature 98.1 F (36.7 C), temperature source Oral, resp. rate 20, height 5\' 8"  (1.727 m), weight 82.6 kg, SpO2 94 %.  Discharge Medication List Allergies as of 07/22/2019      Reactions   Flexeril [cyclobenzaprine Hcl] Nausea And Vomiting   Percocet [oxycodone-acetaminophen] Nausea And Vomiting   Vicodin [hydrocodone-acetaminophen] Nausea And Vomiting   Sulfonamide Derivatives Rash      Medication List    TAKE these medications   alfuzosin 10 MG 24 hr tablet Commonly known as: UROXATRAL Take 10 mg by mouth daily with breakfast. Notes to patient: Next dose due tomorrow   amLODipine 2.5 MG tablet Commonly known as: NORVASC TAKE 1 TABLET BY MOUTH EVERY DAY Notes to patient: Next dose due tomorrow   CALCIUM-MAGNESIUM-ZINC PO Take 1 tablet by mouth daily.   cholecalciferol 25 MCG (1000 UNIT) tablet Commonly known as: VITAMIN D3 Take 1,000 Units by mouth daily.   dorzolamide 2 % ophthalmic solution Commonly known as: TRUSOPT Place 1 drop into the right eye 2 (two) times daily. Notes to patient: Next dose due this evening   esomeprazole 20 MG capsule Commonly known as: NEXIUM Take 20 mg by mouth daily.   finasteride 5 MG tablet Commonly known as: PROSCAR Take 5 mg by mouth daily. Notes to patient: Next dose due tomorrow   furosemide 40 MG tablet Commonly known as: LASIX TAKE 1 TABLET BY MOUTH EVERY DAY   latanoprost 0.005 % ophthalmic solution Commonly known as: XALATAN Place 1 drop into both eyes at  bedtime. Notes to patient: Next dose due tonight   metoprolol tartrate 25 MG tablet Commonly known as: LOPRESSOR TAKE 1/2 TABLET BY MOUTH TWICE DAILY Notes to patient: Next dose due this evening   OMEGA-3 2100 PO Take 2,100 mg by mouth daily.   potassium chloride SA 20 MEQ tablet Commonly known as: KLOR-CON TAKE 1 TABLET BY MOUTH EVERY DAY   rivaroxaban 20 MG Tabs tablet Commonly known as: Xarelto TAKE 1 TABLET BY MOUTH DAILY WITH SUPPER, RESTART ON 6/26 What changed: See the new instructions.   UNABLE TO FIND at bedtime. On CPAP        Doristine Devoid, RN

## 2019-07-22 NOTE — Discharge Summary (Signed)
Physician Discharge Summary  [name] LNL:[id number] DOB: [birth year] DOA: 07/20/2019  PCP: Curlene Labrum, MD  Admit date: 07/20/2019 Discharge date: 07/22/2019  Admitted From: Home Disposition: Home  Recommendations for Outpatient Follow-up:  1. Follow up with PCP in 1-2 weeks 2. Please obtain BMP/CBC in one week 3. Follow-up with gastroenterology   Discharge Condition: Stable CODE STATUS: Full code Diet recommendation: Heart healthy  Brief/Interim Summary: Michael Huish Fergusonis a 86 y.o.malewith medical history significant foratrial fibrillation on Xarelto, BPH, GERD, hypertension, and noted diverticulosis as well as internal hemorrhoids on prior colonoscopy 12/2017 who presented to the ED with persistent rectal bleeding over the past 5 days. He states that he is noted quite a bit of blood on his stool and toilet bowl. His stools have appeared to look like grape jelly. The episodes became more frequent this a.m. with 3 episodes noted around 3 AM. He denies any abdominal pain, nausea, vomiting, fever, chills, or any other symptoms. He denies any lightheadedness or dizziness. He takes Xarelto regularly and took last dose this morning.  Discharge Diagnoses:  Active Problems:   ATRIAL FIBRILLATION   AAA (abdominal aortic aneurysm) (HCC)   GI bleed  1. Rectal bleeding.  Suspect this related to diverticulosis in the setting of anticoagulation.  Xarelto was held on admission.    GI evaluated the patient and underwent colonoscopy.  Results indicated normal ileum, diminutive polyp in the proximal RCA, internal hemorrhoids.  It was felt that his bleeding was likely diverticular and has resolved.  Recommendations were to restart Xarelto on 6/26.. 2. Normocytic anemia.  Mild decrease in hemoglobin since admission.  No signs of ongoing bleeding.    This will be followed up as an outpatient 3. Hypertension.  Continue on metoprolol and amlodipine.  Blood pressure currently  stable. 4. GERD.  Continue on PPI 5. Paroxysmal atrial fibrillation.  Continue Lopressor for rate control.  Plans to resume Xarelto on 6/26  Discharge Instructions  Discharge Instructions    Diet - low sodium heart healthy   Complete by: As directed    Increase activity slowly   Complete by: As directed      Allergies as of 07/22/2019      Reactions   Flexeril [cyclobenzaprine Hcl] Nausea And Vomiting   Percocet [oxycodone-acetaminophen] Nausea And Vomiting   Vicodin [hydrocodone-acetaminophen] Nausea And Vomiting   Sulfonamide Derivatives Rash      Medication List    TAKE these medications   alfuzosin 10 MG 24 hr tablet Commonly known as: UROXATRAL Take 10 mg by mouth daily with breakfast. Notes to patient: Next dose due tomorrow   amLODipine 2.5 MG tablet Commonly known as: NORVASC TAKE 1 TABLET BY MOUTH EVERY DAY Notes to patient: Next dose due tomorrow   CALCIUM-MAGNESIUM-ZINC PO Take 1 tablet by mouth daily.   cholecalciferol 25 MCG (1000 UNIT) tablet Commonly known as: VITAMIN D3 Take 1,000 Units by mouth daily.   dorzolamide 2 % ophthalmic solution Commonly known as: TRUSOPT Place 1 drop into the right eye 2 (two) times daily. Notes to patient: Next dose due this evening   esomeprazole 20 MG capsule Commonly known as: NEXIUM Take 20 mg by mouth daily.   finasteride 5 MG tablet Commonly known as: PROSCAR Take 5 mg by mouth daily. Notes to patient: Next dose due tomorrow   furosemide 40 MG tablet Commonly known as: LASIX TAKE 1 TABLET BY MOUTH EVERY DAY   latanoprost 0.005 % ophthalmic solution Commonly known as: XALATAN Place 1  drop into both eyes at bedtime. Notes to patient: Next dose due tonight   metoprolol tartrate 25 MG tablet Commonly known as: LOPRESSOR TAKE 1/2 TABLET BY MOUTH TWICE DAILY Notes to patient: Next dose due this evening   OMEGA-3 2100 PO Take 2,100 mg by mouth daily.   potassium chloride SA 20 MEQ tablet Commonly  known as: KLOR-CON TAKE 1 TABLET BY MOUTH EVERY DAY   rivaroxaban 20 MG Tabs tablet Commonly known as: Xarelto TAKE 1 TABLET BY MOUTH DAILY WITH SUPPER, RESTART ON 6/26 What changed: See the new instructions.   UNABLE TO FIND at bedtime. On CPAP       Allergies  Allergen Reactions   Flexeril [Cyclobenzaprine Hcl] Nausea And Vomiting   Percocet [Oxycodone-Acetaminophen] Nausea And Vomiting   Vicodin [Hydrocodone-Acetaminophen] Nausea And Vomiting   Sulfonamide Derivatives Rash    Consultations:  Gastroenterology   Procedures/Studies: CUP PACEART REMOTE DEVICE CHECK  Result Date: 06/30/2019 Scheduled remote reviewed. Normal device function.  1 AMS episodes. Appears to be ST @ 134 bpm x 3:26 min. Known PAF. [place] Xarelto. Next remote 91 days. [place]      Subjective: Has had a bowel movement that was not bloody or dark in color.  No vomiting.  Discharge Exam: Vitals:   07/21/19 1405 07/21/19 1940 07/21/19 2109 07/22/19 0544  BP: 99/61  101/72 122/71  Pulse: (!) 59  60 (!) 59  Resp: 14  20 20   Temp:   98.8 F (37.1 C) 98.1 F (36.7 C)  TempSrc:   Oral Oral  SpO2: 97% 97% 94% 94%  Weight:      Height:        General: Pt is alert, awake, not in acute distress Cardiovascular: RRR, S1/S2 +, no rubs, no gallops Respiratory: CTA bilaterally, no wheezing, no rhonchi Abdominal: Soft, NT, ND, bowel sounds + Extremities: no edema, no cyanosis    The results of significant diagnostics from this hospitalization (including imaging, microbiology, ancillary and laboratory) are listed below for reference.     Microbiology: Recent Results (from the past 240 hour(s))  SARS Coronavirus 2 by RT PCR (hospital order, performed in [facility] hospital lab) Nasopharyngeal Nasopharyngeal Swab     Status: None   Collection Time: 07/20/19  1:20 PM   Specimen: Nasopharyngeal Swab  Result Value Ref Range Status   SARS Coronavirus 2 NEGATIVE NEGATIVE Final    Comment:  (NOTE) SARS-CoV-2 target nucleic acids are NOT DETECTED.  The SARS-CoV-2 RNA is generally detectable in upper and lower respiratory specimens during the acute phase of infection. The lowest concentration of SARS-CoV-2 viral copies this assay can detect is 250 copies / mL. A negative result does not preclude SARS-CoV-2 infection and should not be used as the sole basis for treatment or other patient management decisions.  A negative result may occur with improper specimen collection / handling, submission of specimen other than nasopharyngeal swab, presence of viral mutation(s) within the areas targeted by this assay, and inadequate number of viral copies (<250 copies / mL). A negative result must be combined with clinical observations, patient history, and epidemiological information.  Fact Sheet for Patients:   StrictlyIdeas.no  Fact Sheet for Healthcare Providers: BankingDealers.co.za  This test is not yet approved or  cleared by the [place] FDA and has been authorized for detection and/or diagnosis of SARS-CoV-2 by FDA under an Emergency Use Authorization (EUA).  This EUA will remain in effect (meaning this test can be used) for the duration of the COVID-19  declaration under Section 564(b)(1) of the Act, 21 U.S.C. section 360bbb-3(b)(1), unless the authorization is terminated or revoked sooner.  Performed at [facility], [address] [place], [place] [address]   MRSA PCR Screening     Status: Abnormal   Collection Time: 07/21/19 10:24 AM   Specimen: Nasal Mucosa; Nasopharyngeal  Result Value Ref Range Status   MRSA by PCR POSITIVE (A) NEGATIVE Final    Comment:        The GeneXpert MRSA Assay (FDA approved for NASAL specimens only), is one component of a comprehensive MRSA colonization surveillance program. It is not intended to diagnose MRSA infection nor to guide or monitor treatment for MRSA infections. RESULT  CALLED TO, READ BACK BY AND VERIFIED WITH: BROWN S. AT 1353 ON [id number] BY THOMPSON S. Performed at [facility], [address], [place], [place] [address]      Labs: BNP (last 3 results) No results for input(s): BNP in the last 8760 hours. Basic Metabolic Panel: Recent Labs  Lab 07/20/19 1015 07/21/19 0622  NA 137 139  K 3.9 3.6  CL 104 105  CO2 23 25  GLUCOSE 124* 102*  BUN 12 10  CREATININE 1.09 0.94  CALCIUM 9.1 8.9   Liver Function Tests: Recent Labs  Lab 07/20/19 1015 07/21/19 0622  AST 16 16  ALT 11 10  ALKPHOS 52 47  BILITOT 1.0 1.3*  PROT 6.5 5.8*  ALBUMIN 3.5 3.1*   No results for input(s): LIPASE, AMYLASE in the last 168 hours. No results for input(s): AMMONIA in the last 168 hours. CBC: Recent Labs  Lab 07/20/19 1015 07/21/19 0622 07/22/19 0448  WBC 6.6 5.9 6.1  HGB 10.8* 9.9* 9.2*  HCT 34.1* 30.8* 29.1*  MCV 91.4 91.1 90.9  PLT 177 164 156   Cardiac Enzymes: No results for input(s): CKTOTAL, CKMB, CKMBINDEX, TROPONINI in the last 168 hours. BNP: Invalid input(s): POCBNP CBG: No results for input(s): GLUCAP in the last 168 hours. D-Dimer No results for input(s): DDIMER in the last 72 hours. Hgb A1c No results for input(s): HGBA1C in the last 72 hours. Lipid Profile No results for input(s): CHOL, HDL, LDLCALC, TRIG, CHOLHDL, LDLDIRECT in the last 72 hours. Thyroid function studies No results for input(s): TSH, T4TOTAL, T3FREE, THYROIDAB in the last 72 hours.  Invalid input(s): FREET3 Anemia work up Recent Labs    07/20/19 1015  VITAMINB12 207  FOLATE 23.9  FERRITIN 10*  TIBC 361  IRON 46  RETICCTPCT 1.2   Urinalysis    Component Value Date/Time   COLORURINE YELLOW 11/17/2014 [place] 11/17/2014 1634   LABSPEC 1.010 11/17/2014 1634   PHURINE 5.5 11/17/2014 1634   GLUCOSEU NEGATIVE 11/17/2014 1634   HGBUR MODERATE (A) 11/17/2014 1634   BILIRUBINUR NEGATIVE 11/17/2014 1634   KETONESUR NEGATIVE 11/17/2014 1634    PROTEINUR NEGATIVE 11/17/2014 1634   UROBILINOGEN 0.2 11/17/2014 1634   NITRITE NEGATIVE 11/17/2014 1634   LEUKOCYTESUR NEGATIVE 11/17/2014 1634   Sepsis Labs Invalid input(s): PROCALCITONIN,  WBC,  LACTICIDVEN Microbiology Recent Results (from the past 240 hour(s))  SARS Coronavirus 2 by RT PCR (hospital order, performed in [place] hospital lab) Nasopharyngeal Nasopharyngeal Swab     Status: None   Collection Time: 07/20/19  1:20 PM   Specimen: Nasopharyngeal Swab  Result Value Ref Range Status   SARS Coronavirus 2 NEGATIVE NEGATIVE Final    Comment: (NOTE) SARS-CoV-2 target nucleic acids are NOT DETECTED.  The SARS-CoV-2 RNA is generally detectable in  upper and lower respiratory specimens during the acute phase of infection. The lowest concentration of SARS-CoV-2 viral copies this assay can detect is 250 copies / mL. A negative result does not preclude SARS-CoV-2 infection and should not be used as the sole basis for treatment or other patient management decisions.  A negative result may occur with improper specimen collection / handling, submission of specimen other than nasopharyngeal swab, presence of viral mutation(s) within the areas targeted by this assay, and inadequate number of viral copies (<250 copies / mL). A negative result must be combined with clinical observations, patient history, and epidemiological information.  Fact Sheet for Patients:   StrictlyIdeas.no  Fact Sheet for Healthcare Providers: BankingDealers.co.za  This test is not yet approved or  cleared by the [place] FDA and has been authorized for detection and/or diagnosis of SARS-CoV-2 by FDA under an Emergency Use Authorization (EUA).  This EUA will remain in effect (meaning this test can be used) for the duration of the COVID-19 declaration under Section 564(b)(1) of the Act, 21 U.S.C. section 360bbb-3(b)(1), unless the authorization is  terminated or revoked sooner.  Performed at [facility], [address] [place], [place] [address]   MRSA PCR Screening     Status: Abnormal   Collection Time: 07/21/19 10:24 AM   Specimen: Nasal Mucosa; Nasopharyngeal  Result Value Ref Range Status   MRSA by PCR POSITIVE (A) NEGATIVE Final    Comment:        The GeneXpert MRSA Assay (FDA approved for NASAL specimens only), is one component of a comprehensive MRSA colonization surveillance program. It is not intended to diagnose MRSA infection nor to guide or monitor treatment for MRSA infections. RESULT CALLED TO, READ BACK BY AND VERIFIED WITH: BROWN S. AT 1353 ON [id number] BY THOMPSON S. Performed at [facility], [address] [place], [place] [address]      Time coordinating discharge: 55mins  SIGNED:   Kathie Dike, MD  [facility] 07/22/2019, 8:35 PM   If 7PM-7AM, please contact night-coverage [email or web address]

## 2019-07-22 NOTE — Progress Notes (Signed)
Subjective:  Patient has no complaints.  He passed liquid stool last night but none this morning.  He denies abdominal pain.  He has good appetite.  Objective: Blood pressure 122/71, pulse (!) 59, temperature 98.1 F (36.7 C), temperature source Oral, resp. rate 20, height '5\' 8"'$  (1.727 m), weight 82.6 kg, SpO2 94 %. Patient is alert and in no acute distress. Abdomen is full but soft and nontender with organomegaly or masses.  Labs/studies Results:  CBC Latest Ref Rng & Units 07/22/2019 07/21/2019 07/20/2019  WBC 4.0 - 10.5 K/uL 6.1 5.9 6.6  Hemoglobin 13.0 - 17.0 g/dL 9.2(L) 9.9(L) 10.8(L)  Hematocrit 39 - 52 % 29.1(L) 30.8(L) 34.1(L)  Platelets 150 - 400 K/uL 156 164 177    CMP Latest Ref Rng & Units 07/21/2019 07/20/2019 01/11/2018  Glucose 70 - 99 mg/dL 102(H) 124(H) 113(H)  BUN 8 - 23 mg/dL '10 12 12  '$ Creatinine 0.61 - 1.24 mg/dL 0.94 1.09 0.89  Sodium 135 - 145 mmol/L 139 137 140  Potassium 3.5 - 5.1 mmol/L 3.6 3.9 3.9  Chloride 98 - 111 mmol/L 105 104 109  CO2 22 - 32 mmol/L '25 23 24  '$ Calcium 8.9 - 10.3 mg/dL 8.9 9.1 9.1  Total Protein 6.5 - 8.1 g/dL 5.8(L) 6.5 6.1(L)  Total Bilirubin 0.3 - 1.2 mg/dL 1.3(H) 1.0 0.9  Alkaline Phos 38 - 126 U/L 47 52 48  AST 15 - 41 U/L '16 16 15  '$ ALT 0 - 44 U/L '10 11 9    '$ Hepatic Function Latest Ref Rng & Units 07/21/2019 07/20/2019 01/11/2018  Total Protein 6.5 - 8.1 g/dL 5.8(L) 6.5 6.1(L)  Albumin 3.5 - 5.0 g/dL 3.1(L) 3.5 3.3(L)  AST 15 - 41 U/L '16 16 15  '$ ALT 0 - 44 U/L '10 11 9  '$ Alk Phosphatase 38 - 126 U/L 47 52 48  Total Bilirubin 0.3 - 1.2 mg/dL 1.3(H) 1.0 0.9  Bilirubin, Direct 0.1 - 0.5 mg/dL - - -     Assessment:  #1.  Colonic diverticular bleed in the setting of anticoagulation.  Patient underwent colonoscopy yesterday with removal small polyp from ascending colon.  None of the diverticula had stigmata of bleed.  He has been off Xarelto for 2 days. Discussed with Dr. Roderic Palau.  Patient stable for discharge.  #2.  Anemia.  His  hemoglobin has dropped from 10.8 on admission to 9.2 today.  His baseline hemoglobin is unknown.  He is hemodynamically stable.  Recommendations  Stable for discharge. Patient can resume anticoagulant in 48 hours. We will plan to check H&H in 1 week.  My office will contact patient.

## 2019-07-23 LAB — SURGICAL PATHOLOGY

## 2019-07-26 ENCOUNTER — Encounter (HOSPITAL_COMMUNITY): Payer: Self-pay | Admitting: Internal Medicine

## 2019-07-27 ENCOUNTER — Other Ambulatory Visit (INDEPENDENT_AMBULATORY_CARE_PROVIDER_SITE_OTHER): Payer: Self-pay | Admitting: Internal Medicine

## 2019-07-27 DIAGNOSIS — D62 Acute posthemorrhagic anemia: Secondary | ICD-10-CM

## 2019-07-29 DIAGNOSIS — D649 Anemia, unspecified: Secondary | ICD-10-CM | POA: Diagnosis not present

## 2019-07-30 LAB — HEMOGLOBIN AND HEMATOCRIT, BLOOD
HCT: 32.1 % — ABNORMAL LOW (ref 38.5–50.0)
Hemoglobin: 10.1 g/dL — ABNORMAL LOW (ref 13.2–17.1)

## 2019-08-01 ENCOUNTER — Other Ambulatory Visit: Payer: Self-pay | Admitting: Cardiovascular Disease

## 2019-08-06 ENCOUNTER — Other Ambulatory Visit (INDEPENDENT_AMBULATORY_CARE_PROVIDER_SITE_OTHER): Payer: Self-pay | Admitting: *Deleted

## 2019-08-06 DIAGNOSIS — D62 Acute posthemorrhagic anemia: Secondary | ICD-10-CM

## 2019-08-25 ENCOUNTER — Other Ambulatory Visit (INDEPENDENT_AMBULATORY_CARE_PROVIDER_SITE_OTHER): Payer: Self-pay | Admitting: *Deleted

## 2019-08-25 DIAGNOSIS — D62 Acute posthemorrhagic anemia: Secondary | ICD-10-CM

## 2019-08-27 DIAGNOSIS — M19011 Primary osteoarthritis, right shoulder: Secondary | ICD-10-CM | POA: Diagnosis not present

## 2019-08-27 DIAGNOSIS — I130 Hypertensive heart and chronic kidney disease with heart failure and stage 1 through stage 4 chronic kidney disease, or unspecified chronic kidney disease: Secondary | ICD-10-CM | POA: Diagnosis not present

## 2019-08-27 DIAGNOSIS — I5032 Chronic diastolic (congestive) heart failure: Secondary | ICD-10-CM | POA: Diagnosis not present

## 2019-08-27 DIAGNOSIS — E7849 Other hyperlipidemia: Secondary | ICD-10-CM | POA: Diagnosis not present

## 2019-09-01 ENCOUNTER — Other Ambulatory Visit: Payer: Self-pay | Admitting: Cardiology

## 2019-09-08 DIAGNOSIS — I739 Peripheral vascular disease, unspecified: Secondary | ICD-10-CM | POA: Diagnosis not present

## 2019-09-08 DIAGNOSIS — M79671 Pain in right foot: Secondary | ICD-10-CM | POA: Diagnosis not present

## 2019-09-08 DIAGNOSIS — L110 Acquired keratosis follicularis: Secondary | ICD-10-CM | POA: Diagnosis not present

## 2019-09-08 DIAGNOSIS — M79672 Pain in left foot: Secondary | ICD-10-CM | POA: Diagnosis not present

## 2019-09-17 DIAGNOSIS — D62 Acute posthemorrhagic anemia: Secondary | ICD-10-CM | POA: Diagnosis not present

## 2019-09-17 LAB — HEMOGLOBIN AND HEMATOCRIT, BLOOD
HCT: 31.9 % — ABNORMAL LOW (ref 38.5–50.0)
Hemoglobin: 10 g/dL — ABNORMAL LOW (ref 13.2–17.1)

## 2019-09-22 ENCOUNTER — Other Ambulatory Visit (INDEPENDENT_AMBULATORY_CARE_PROVIDER_SITE_OTHER): Payer: Self-pay | Admitting: *Deleted

## 2019-09-22 DIAGNOSIS — D62 Acute posthemorrhagic anemia: Secondary | ICD-10-CM

## 2019-09-23 ENCOUNTER — Other Ambulatory Visit (INDEPENDENT_AMBULATORY_CARE_PROVIDER_SITE_OTHER): Payer: Self-pay | Admitting: *Deleted

## 2019-09-23 DIAGNOSIS — Z7189 Other specified counseling: Secondary | ICD-10-CM | POA: Insufficient documentation

## 2019-09-23 DIAGNOSIS — D62 Acute posthemorrhagic anemia: Secondary | ICD-10-CM

## 2019-09-23 NOTE — Progress Notes (Signed)
Cardiology Office Note   Date:  09/24/2019   ID:  [name], DOB 01-30-1926, MRN [id number]  PCP:  Curlene Labrum, MD  Cardiologist:   Minus Breeding, MD  Chief Complaint  Patient presents with  . Fatigue      History of Present Illness: [name] is a 86 y.o. male who presents for follow-up of exertional dyspnea.  He was to have a right left heart catheterization and coronary angiography in October 2020 but he had called our office back in deferred having this done.  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  He had PAF.    The patient reports fatigue.  However, as opposed to the report from his other cardiology notes he does not report shortness of breath.  He thinks he is just slowly getting decreased exercise tolerance related to his age.  He says he walks 25 yards to his mailbox and he will be fatigued.  However, he is not describing chest pressure, neck or arm discomfort.  He is not having any shortness of breath, PND or orthopnea.  He is not describing any palpitations, presyncope or syncope.  He has had no weight gain or edema   Past Medical History:  Diagnosis Date  . AAA (abdominal aortic aneurysm) ([place])   . Atrial fibrillation (HCC)    bicarbonate perfusion study 10/11 EF 69%. small defects no ischemia. 7 metastases acheived. ER visit 11/10/09 ruled out MI normal sinus rhythm orthostasis after sublingual nitroglycerin   . BPH (benign prostatic hyperplasia)   . Chronic back pain   . GERD (gastroesophageal reflux disease)   . Hypertension   . Orthostatic hypotension   . PONV (postoperative nausea and vomiting)   . Sleep apnea    CPAP at night  . Stress fracture of foot    left  . Swelling of both lower extremities     Past Surgical History:  Procedure Laterality Date  . CHOLECYSTECTOMY  2012   [facility] by Dr. Geroge Baseman  . COLONOSCOPY N/A 08/27/2012   Procedure: COLONOSCOPY;  Surgeon: Rogene Houston,  MD;  [facility];  Service: Endoscopy;  Laterality: N/A;  325  . COLONOSCOPY N/A 01/12/2018   Procedure: COLONOSCOPY;  Surgeon: Rogene Houston, MD;  [facility];  Service: Endoscopy;  Laterality: N/A;  Has staff Christmas party at his house at lunch today per office  . COLONOSCOPY N/A 07/21/2019   Procedure: COLONOSCOPY;  Surgeon: Rogene Houston, MD;  [facility];  Service: Endoscopy;  Laterality: N/A;  . HERNIA REPAIR Bilateral    Inguinal and umbilical  . INCISIONAL HERNIA REPAIR N/A 01/31/2014   Procedure: Fatima Blank HERNIORRHAPHY WITH MESH;  Surgeon: Jamesetta So, MD;  Location: [facility] ORS;  Service: General;  Laterality: N/A;  . INSERTION OF MESH N/A 01/31/2014   Procedure: INSERTION OF MESH;  Surgeon: Jamesetta So, MD;  Location: [facility] OR[facility];  Service: General;  Laterality: N/A;  . PACEMAKER IMPLANT N/A 12/02/2017   St Jude Medical Assurity MRI conditional  dual-chamber pacemaker by Dr Rayann Heman for symptomatic sinus bradycardia  . POLYPECTOMY  01/12/2018   Procedure: POLYPECTOMY;  Surgeon: Rogene Houston, MD;  [facility];  Service: Endoscopy;;  transverse colon polyp, ascending colon polyps x2, hepatic flexure polyps x2   . POLYPECTOMY  07/21/2019   Procedure: POLYPECTOMY;  Surgeon: Rogene Houston, MD;  [facility];  Service:  Endoscopy;;  . SHOULDER SURGERY Right 2005?   for open rotator cuff repair. [place]  . WOUND DEBRIDEMENT Right 01/31/2014   Procedure: DEBRIDEMENT WOUND RIGHT LEG;  Surgeon: Jamesetta So, MD;  Location: [facility] OR[facility];  Service: General;  Laterality: Right;  . WRIST FRACTURE SURGERY Right      Current Outpatient Medications  Medication Sig Dispense Refill  . alfuzosin (UROXATRAL) 10 MG 24 hr tablet Take 10 mg by mouth daily with breakfast.    . CALCIUM-MAGNESIUM-ZINC PO Take 1 tablet by mouth daily.    . cholecalciferol (VITAMIN D3) 25 MCG (1000 UT) tablet Take 1,000 Units by mouth daily.    .  dorzolamide (TRUSOPT) 2 % ophthalmic solution Place 1 drop into the right eye 2 (two) times daily.     Marland Kitchen esomeprazole (NEXIUM) 20 MG capsule Take 20 mg by mouth daily.    . finasteride (PROSCAR) 5 MG tablet Take 5 mg by mouth daily.      . furosemide (LASIX) 40 MG tablet TAKE 1 TABLET BY MOUTH EVERY DAY 90 tablet 1  . latanoprost (XALATAN) 0.005 % ophthalmic solution Place 1 drop into both eyes at bedtime.     . metoprolol tartrate (LOPRESSOR) 25 MG tablet TAKE 1/2 TABLET BY MOUTH TWICE DAILY 90 tablet 1  . Omega-3 Fatty Acids (OMEGA-3 2100 PO) Take 2,100 mg by mouth daily.    . potassium chloride SA (KLOR-CON) 20 MEQ tablet TAKE 1 TABLET BY MOUTH EVERY DAY 90 tablet 2  . rivaroxaban (XARELTO) 20 MG TABS tablet TAKE 1 TABLET BY MOUTH DAILY WITH SUPPER, RESTART ON 6/26 30 tablet 6  . UNABLE TO FIND at bedtime. On CPAP     Current Facility-Administered Medications  Medication Dose Route Frequency Provider Last Rate Last Admin  . sodium chloride flush (NS) 0.9 % injection 3 mL  3 mL Intravenous Q12H Herminio Commons, MD        Allergies:   Flexeril [cyclobenzaprine hcl], Percocet [oxycodone-acetaminophen], Vicodin [hydrocodone-acetaminophen], and Sulfonamide derivatives    ROS:  Please see the history of present illness.   Otherwise, review of systems are positive for none.   All other systems are reviewed and negative.    PHYSICAL EXAM: VS:  BP 98/60 (BP Location: Right Arm, Cuff Size: Normal)   Pulse 60   Ht 5\' 8"  (1.727 m)   Wt 188 lb 3.2 oz (85.4 kg)   SpO2 97%   BMI 28.62 kg/m  , BMI Body mass index is 28.62 kg/m. GENERAL:  Well appearing NECK:  No jugular venous distention, waveform within normal limits, carotid upstroke brisk and symmetric, no bruits, no thyromegaly LUNGS:  Clear to auscultation bilaterally CHEST:  Well healed pacemaker pocket.   HEART:  PMI not displaced or sustained,S1 and S2 within normal limits, no S3, no S4, no clicks, no rubs, no murmurs ABD:  Flat,  positive bowel sounds normal in frequency in pitch, no bruits, no rebound, no guarding, no midline pulsatile mass, no hepatomegaly, no splenomegaly EXT:  2 plus pulses throughout, mild bilateral ankle edema, no cyanosis no clubbing   EKG:  EKG is not ordered today.    Recent Labs: 07/21/2019: ALT 10; BUN 10; Creatinine, Ser 0.94; Potassium 3.6; Sodium 139 07/22/2019: Platelets 156 09/17/2019: Hemoglobin 10.0    Lipid Panel No results found for: CHOL, TRIG, HDL, CHOLHDL, VLDL, LDLCALC, LDLDIRECT    Wt Readings from Last 3 Encounters:  09/24/19 188 lb 3.2 oz (85.4 kg)  07/21/19 182 lb (  82.6 kg)  03/30/19 187 lb 3.2 oz (84.9 kg)      Other studies Reviewed: Additional studies/ records that were reviewed today include: None. Review of the above records demonstrates:  Please see elsewhere in the note.     ASSESSMENT AND PLAN:  STATUS POST PACEMAKER PLACEMENT:   I did review the most recent interrogation I could find from June.  Has normal device function.  PAF: He does not have any symptomatic paroxysms and he tolerates anticoagulation.  There were no sustained high rates recorded on his device.  FATIGUE: His fatigue could be some interrupted sleep from frequent urination and just trouble falling asleep.  He does have a mild anemia he tells me and he just had his hemoglobin checked and it was 10.  He is having this followed.  I will defer to Burdine, Virgina Evener, MD to see if he has had thyroid.  He does have a low blood pressure at baseline around 100 and even lower today.  I am going to discontinue his Norvasc.  HTN: As above.  I think his blood pressures drifted down over time and I do not think he needs the Norvasc.  We will see if this helps with his fatigue.  He will keep an eye on his blood pressure.  COVID EDUCATION: He has been vaccinated.  Current medicines are reviewed at length with the patient today.  The patient does not have concerns regarding medicines.  The following  changes have been made:  no change  Labs/ tests ordered today include:  No orders of the defined types were placed in this encounter.    Disposition:   FU with me in 12 months.     Signed, Minus Breeding, MD  09/24/2019 3:37 PM    [facility]

## 2019-09-24 ENCOUNTER — Encounter: Payer: Self-pay | Admitting: Cardiology

## 2019-09-24 ENCOUNTER — Other Ambulatory Visit: Payer: Self-pay

## 2019-09-24 ENCOUNTER — Ambulatory Visit (INDEPENDENT_AMBULATORY_CARE_PROVIDER_SITE_OTHER): Payer: Medicare Other | Admitting: Cardiology

## 2019-09-24 VITALS — BP 98/60 | HR 60 | Ht 68.0 in | Wt 188.2 lb

## 2019-09-24 DIAGNOSIS — I495 Sick sinus syndrome: Secondary | ICD-10-CM

## 2019-09-24 DIAGNOSIS — Z7189 Other specified counseling: Secondary | ICD-10-CM | POA: Diagnosis not present

## 2019-09-24 DIAGNOSIS — I10 Essential (primary) hypertension: Secondary | ICD-10-CM | POA: Diagnosis not present

## 2019-09-24 DIAGNOSIS — I480 Paroxysmal atrial fibrillation: Secondary | ICD-10-CM

## 2019-09-24 NOTE — Patient Instructions (Signed)
Your physician wants you to follow-up in: [place] will receive a reminder letter in the mail two months in advance. If you don't receive a letter, please call our office to schedule the follow-up appointment.  Your physician has recommended you make the following change in your medication:   STOP AMLODIPINE   Thank you for choosing [facility]!!

## 2019-09-29 ENCOUNTER — Ambulatory Visit (INDEPENDENT_AMBULATORY_CARE_PROVIDER_SITE_OTHER): Payer: Medicare Other | Admitting: *Deleted

## 2019-09-29 DIAGNOSIS — I495 Sick sinus syndrome: Secondary | ICD-10-CM

## 2019-09-29 LAB — CUP PACEART REMOTE DEVICE CHECK
Battery Remaining Longevity: 110 mo
Battery Remaining Percentage: 95.5 %
Battery Voltage: 3.02 V
Brady Statistic AP VP Percent: 1 %
Brady Statistic AP VS Percent: 96 %
Brady Statistic AS VP Percent: 1 %
Brady Statistic AS VS Percent: 3.7 %
Brady Statistic RA Percent Paced: 96 %
Brady Statistic RV Percent Paced: 1 %
Date Time Interrogation Session: 20210901020013
Implantable Lead Implant Date: 20191105
Implantable Lead Implant Date: 20191105
Implantable Lead Location: 753859
Implantable Lead Location: 753860
Implantable Pulse Generator Implant Date: 20191105
Lead Channel Impedance Value: 400 Ohm
Lead Channel Impedance Value: 400 Ohm
Lead Channel Pacing Threshold Amplitude: 0.5 V
Lead Channel Pacing Threshold Amplitude: 0.5 V
Lead Channel Pacing Threshold Pulse Width: 0.5 ms
Lead Channel Pacing Threshold Pulse Width: 0.5 ms
Lead Channel Sensing Intrinsic Amplitude: 2.6 mV
Lead Channel Sensing Intrinsic Amplitude: 7.5 mV
Lead Channel Setting Pacing Amplitude: 2 V
Lead Channel Setting Pacing Amplitude: 2.5 V
Lead Channel Setting Pacing Pulse Width: 0.5 ms
Lead Channel Setting Sensing Sensitivity: 2 mV
Pulse Gen Model: 2272
Pulse Gen Serial Number: 9079121

## 2019-09-30 NOTE — Progress Notes (Signed)
Remote pacemaker transmission.   

## 2019-10-07 ENCOUNTER — Ambulatory Visit: Payer: Medicare Other | Admitting: Cardiovascular Disease

## 2019-10-08 ENCOUNTER — Telehealth: Payer: Self-pay | Admitting: Cardiology

## 2019-10-08 DIAGNOSIS — I5032 Chronic diastolic (congestive) heart failure: Secondary | ICD-10-CM | POA: Diagnosis not present

## 2019-10-08 DIAGNOSIS — R5383 Other fatigue: Secondary | ICD-10-CM | POA: Diagnosis not present

## 2019-10-08 DIAGNOSIS — Z6828 Body mass index (BMI) 28.0-28.9, adult: Secondary | ICD-10-CM | POA: Diagnosis not present

## 2019-10-08 DIAGNOSIS — Z950 Presence of cardiac pacemaker: Secondary | ICD-10-CM | POA: Diagnosis not present

## 2019-10-08 DIAGNOSIS — I10 Essential (primary) hypertension: Secondary | ICD-10-CM | POA: Diagnosis not present

## 2019-10-08 DIAGNOSIS — I495 Sick sinus syndrome: Secondary | ICD-10-CM | POA: Diagnosis not present

## 2019-10-08 DIAGNOSIS — I480 Paroxysmal atrial fibrillation: Secondary | ICD-10-CM | POA: Diagnosis not present

## 2019-10-08 NOTE — Telephone Encounter (Signed)
OK 

## 2019-10-08 NOTE — Telephone Encounter (Signed)
I spoke with Angela Nevin at [facility]. She is calling to let Dr Percival Spanish know Dr Pleas Koch decreased patient's metoprolol to once daily. Med list updated.

## 2019-10-08 NOTE — Telephone Encounter (Signed)
Carla with Dr. Lizbeth Bark office ([place]) is calling to notify Dr. Percival Spanish of a medication change. She states today during appointment with Dr. Pleas Koch, patient reported he has been experiencing hypotension and fatigue. Dr. Pleas Koch advised that patient decrease Metoprolol to half a tablet once daily. A call may be returned to [place] to discuss at [phone].

## 2019-10-20 DIAGNOSIS — D649 Anemia, unspecified: Secondary | ICD-10-CM | POA: Diagnosis not present

## 2019-10-28 DIAGNOSIS — I130 Hypertensive heart and chronic kidney disease with heart failure and stage 1 through stage 4 chronic kidney disease, or unspecified chronic kidney disease: Secondary | ICD-10-CM | POA: Diagnosis not present

## 2019-10-28 DIAGNOSIS — I5032 Chronic diastolic (congestive) heart failure: Secondary | ICD-10-CM | POA: Diagnosis not present

## 2019-10-28 DIAGNOSIS — E7849 Other hyperlipidemia: Secondary | ICD-10-CM | POA: Diagnosis not present

## 2019-10-28 DIAGNOSIS — M19011 Primary osteoarthritis, right shoulder: Secondary | ICD-10-CM | POA: Diagnosis not present

## 2019-11-01 ENCOUNTER — Other Ambulatory Visit: Payer: Self-pay | Admitting: Cardiology

## 2019-11-05 DIAGNOSIS — K219 Gastro-esophageal reflux disease without esophagitis: Secondary | ICD-10-CM | POA: Diagnosis not present

## 2019-11-05 DIAGNOSIS — R5383 Other fatigue: Secondary | ICD-10-CM | POA: Diagnosis not present

## 2019-11-05 DIAGNOSIS — I10 Essential (primary) hypertension: Secondary | ICD-10-CM | POA: Diagnosis not present

## 2019-11-05 DIAGNOSIS — R944 Abnormal results of kidney function studies: Secondary | ICD-10-CM | POA: Diagnosis not present

## 2019-11-05 DIAGNOSIS — E871 Hypo-osmolality and hyponatremia: Secondary | ICD-10-CM | POA: Diagnosis not present

## 2019-11-05 DIAGNOSIS — E782 Mixed hyperlipidemia: Secondary | ICD-10-CM | POA: Diagnosis not present

## 2019-11-05 DIAGNOSIS — R5382 Chronic fatigue, unspecified: Secondary | ICD-10-CM | POA: Diagnosis not present

## 2019-11-09 DIAGNOSIS — I10 Essential (primary) hypertension: Secondary | ICD-10-CM | POA: Diagnosis not present

## 2019-11-09 DIAGNOSIS — Z0001 Encounter for general adult medical examination with abnormal findings: Secondary | ICD-10-CM | POA: Diagnosis not present

## 2019-11-09 DIAGNOSIS — I480 Paroxysmal atrial fibrillation: Secondary | ICD-10-CM | POA: Diagnosis not present

## 2019-11-09 DIAGNOSIS — R944 Abnormal results of kidney function studies: Secondary | ICD-10-CM | POA: Diagnosis not present

## 2019-11-09 DIAGNOSIS — E782 Mixed hyperlipidemia: Secondary | ICD-10-CM | POA: Diagnosis not present

## 2019-11-09 DIAGNOSIS — R5383 Other fatigue: Secondary | ICD-10-CM | POA: Diagnosis not present

## 2019-11-09 DIAGNOSIS — Z23 Encounter for immunization: Secondary | ICD-10-CM | POA: Diagnosis not present

## 2019-11-09 DIAGNOSIS — I5032 Chronic diastolic (congestive) heart failure: Secondary | ICD-10-CM | POA: Diagnosis not present

## 2019-11-10 ENCOUNTER — Telehealth: Payer: Self-pay | Admitting: Cardiology

## 2019-11-10 NOTE — Telephone Encounter (Signed)
Patient called stating that he needs to speak to someone on his medication management of  Metoprolol tartrate.Dr. Pleas Koch took patient off of this medication. Patient needs to know if he is to continue taking or not.   [phone]

## 2019-11-10 NOTE — Telephone Encounter (Signed)
Patient saw PCP, Dr.Burdine,  because he had extreme fatigue, low blood pressure (100/60) and HR 60's. He was taken off Lopressor today. His question to you is what would you recommend next ?

## 2019-11-11 NOTE — Telephone Encounter (Signed)
I relayed Dr.Hochreins message to patient

## 2019-11-11 NOTE — Telephone Encounter (Signed)
I don't think that he needs anything in place of the Lopressor.

## 2019-11-15 ENCOUNTER — Other Ambulatory Visit: Payer: Self-pay | Admitting: Family Medicine

## 2019-11-15 DIAGNOSIS — H43813 Vitreous degeneration, bilateral: Secondary | ICD-10-CM | POA: Diagnosis not present

## 2019-11-15 DIAGNOSIS — H401113 Primary open-angle glaucoma, right eye, severe stage: Secondary | ICD-10-CM | POA: Diagnosis not present

## 2019-11-15 DIAGNOSIS — H401122 Primary open-angle glaucoma, left eye, moderate stage: Secondary | ICD-10-CM | POA: Diagnosis not present

## 2019-11-15 DIAGNOSIS — H524 Presbyopia: Secondary | ICD-10-CM | POA: Diagnosis not present

## 2019-11-24 DIAGNOSIS — M79671 Pain in right foot: Secondary | ICD-10-CM | POA: Diagnosis not present

## 2019-11-24 DIAGNOSIS — L110 Acquired keratosis follicularis: Secondary | ICD-10-CM | POA: Diagnosis not present

## 2019-11-24 DIAGNOSIS — M79672 Pain in left foot: Secondary | ICD-10-CM | POA: Diagnosis not present

## 2019-11-24 DIAGNOSIS — I739 Peripheral vascular disease, unspecified: Secondary | ICD-10-CM | POA: Diagnosis not present

## 2019-11-27 DIAGNOSIS — I5032 Chronic diastolic (congestive) heart failure: Secondary | ICD-10-CM | POA: Diagnosis not present

## 2019-11-27 DIAGNOSIS — I130 Hypertensive heart and chronic kidney disease with heart failure and stage 1 through stage 4 chronic kidney disease, or unspecified chronic kidney disease: Secondary | ICD-10-CM | POA: Diagnosis not present

## 2019-11-27 DIAGNOSIS — E7849 Other hyperlipidemia: Secondary | ICD-10-CM | POA: Diagnosis not present

## 2019-11-30 ENCOUNTER — Other Ambulatory Visit: Payer: Self-pay

## 2019-11-30 DIAGNOSIS — I714 Abdominal aortic aneurysm, without rupture, unspecified: Secondary | ICD-10-CM

## 2019-12-03 ENCOUNTER — Encounter: Payer: Self-pay | Admitting: Internal Medicine

## 2019-12-03 ENCOUNTER — Ambulatory Visit (INDEPENDENT_AMBULATORY_CARE_PROVIDER_SITE_OTHER): Payer: Medicare Other | Admitting: Internal Medicine

## 2019-12-03 VITALS — BP 110/70 | HR 68 | Ht 68.0 in | Wt 190.8 lb

## 2019-12-03 DIAGNOSIS — I495 Sick sinus syndrome: Secondary | ICD-10-CM | POA: Diagnosis not present

## 2019-12-03 DIAGNOSIS — I480 Paroxysmal atrial fibrillation: Secondary | ICD-10-CM

## 2019-12-03 DIAGNOSIS — I10 Essential (primary) hypertension: Secondary | ICD-10-CM | POA: Diagnosis not present

## 2019-12-03 LAB — CUP PACEART INCLINIC DEVICE CHECK
Date Time Interrogation Session: 20211105100334
Implantable Lead Implant Date: 20191105
Implantable Lead Implant Date: 20191105
Implantable Lead Location: 753859
Implantable Lead Location: 753860
Implantable Pulse Generator Implant Date: 20191105
Pulse Gen Model: 2272
Pulse Gen Serial Number: 9079121

## 2019-12-03 NOTE — Patient Instructions (Addendum)
Medication Instructions:   Your physician recommends that you continue on your current medications as directed. Please refer to the Current Medication list given to you today.  Labwork:  None  Testing/Procedures:  None  Follow-Up:  Your physician recommends that you schedule a follow-up appointment in: 1 year.  Any Other Special Instructions Will Be Listed Below (If Applicable).  If you need a refill on your cardiac medications before your next appointment, please call your pharmacy. 

## 2019-12-03 NOTE — Progress Notes (Signed)
PCP: Curlene Labrum, MD   Primary EP:  Dr Rayann Heman  [name] is a 86 y.o. male who presents today for routine electrophysiology followup.  Since last being seen in our clinic, the patient reports doing very well.  Today, he denies symptoms of palpitations, chest pain,   dizziness, presyncope, or syncope.  SOB is stable.  Edema is stable.  The patient is otherwise without complaint today.   Past Medical History:  Diagnosis Date  . AAA (abdominal aortic aneurysm) ([place])   . Atrial fibrillation (HCC)    bicarbonate perfusion study 10/11 EF 69%. small defects no ischemia. 7 metastases acheived. ER visit 11/10/09 ruled out MI normal sinus rhythm orthostasis after sublingual nitroglycerin   . BPH (benign prostatic hyperplasia)   . Chronic back pain   . GERD (gastroesophageal reflux disease)   . Hypertension   . Orthostatic hypotension   . PONV (postoperative nausea and vomiting)   . Sleep apnea    CPAP at night  . Stress fracture of foot    left  . Swelling of both lower extremities    Past Surgical History:  Procedure Laterality Date  . CHOLECYSTECTOMY  2012   [facility] by Dr. Geroge Baseman  . COLONOSCOPY N/A 08/27/2012   Procedure: COLONOSCOPY;  Surgeon: Rogene Houston, MD;  [facility];  Service: Endoscopy;  Laterality: N/A;  325  . COLONOSCOPY N/A 01/12/2018   Procedure: COLONOSCOPY;  Surgeon: Rogene Houston, MD;  [facility];  Service: Endoscopy;  Laterality: N/A;  Has staff Christmas party at his house at lunch today per office  . COLONOSCOPY N/A 07/21/2019   Procedure: COLONOSCOPY;  Surgeon: Rogene Houston, MD;  [facility];  Service: Endoscopy;  Laterality: N/A;  . HERNIA REPAIR Bilateral    Inguinal and umbilical  . INCISIONAL HERNIA REPAIR N/A 01/31/2014   Procedure: Fatima Blank HERNIORRHAPHY WITH MESH;  Surgeon: Jamesetta So, MD;  Location: [facility];  Service: General;  Laterality: N/A;  . INSERTION OF MESH N/A 01/31/2014    Procedure: INSERTION OF MESH;  Surgeon: Jamesetta So, MD;  Location: [facility];  Service: General;  Laterality: N/A;  . PACEMAKER IMPLANT N/A 12/02/2017   St Jude Medical Assurity MRI conditional  dual-chamber pacemaker by Dr Rayann Heman for symptomatic sinus bradycardia  . POLYPECTOMY  01/12/2018   Procedure: POLYPECTOMY;  Surgeon: Rogene Houston, MD;  [facility];  Service: Endoscopy;;  transverse colon polyp, ascending colon polyps x2, hepatic flexure polyps x2   . POLYPECTOMY  07/21/2019   Procedure: POLYPECTOMY;  Surgeon: Rogene Houston, MD;  [facility];  Service: Endoscopy;;  . SHOULDER SURGERY Right 2005?   for open rotator cuff repair. [place]  . WOUND DEBRIDEMENT Right 01/31/2014   Procedure: DEBRIDEMENT WOUND RIGHT LEG;  Surgeon: Jamesetta So, MD;  Location: [facility];  Service: General;  Laterality: Right;  . WRIST FRACTURE SURGERY Right     ROS- all systems are reviewed and negative except as per HPI above  Current Outpatient Medications  Medication Sig Dispense Refill  . alfuzosin (UROXATRAL) 10 MG 24 hr tablet Take 10 mg by mouth daily with breakfast.    . CALCIUM-MAGNESIUM-ZINC PO Take 1 tablet by mouth daily.    . cholecalciferol (VITAMIN D3) 25 MCG (1000 UT) tablet Take 1,000 Units by mouth daily.    . dorzolamide (TRUSOPT) 2 % ophthalmic solution Place 1 drop into the right  eye 2 (two) times daily.     Marland Kitchen esomeprazole (NEXIUM) 20 MG capsule Take 20 mg by mouth daily.    . finasteride (PROSCAR) 5 MG tablet Take 5 mg by mouth daily.      . furosemide (LASIX) 40 MG tablet TAKE 1 TABLET BY MOUTH EVERY DAY 90 tablet 1  . latanoprost (XALATAN) 0.005 % ophthalmic solution Place 1 drop into both eyes at bedtime.     . metoprolol tartrate (LOPRESSOR) 25 MG tablet TAKE 1/2 TABLET BY MOUTH TWICE DAILY (Patient taking differently: Take 12.5 mg by mouth daily. ) 90 tablet 1  . Omega-3 Fatty Acids (OMEGA-3 2100 PO) Take 2,100 mg by mouth daily.    .  potassium chloride SA (KLOR-CON) 20 MEQ tablet TAKE 1 TABLET BY MOUTH EVERY DAY 90 tablet 2  . rivaroxaban (XARELTO) 20 MG TABS tablet TAKE 1 TABLET BY MOUTH DAILY WITH SUPPER, RESTART ON 6/26 30 tablet 6  . UNABLE TO FIND at bedtime. On CPAP     Current Facility-Administered Medications  Medication Dose Route Frequency Provider Last Rate Last Admin  . sodium chloride flush (NS) 0.9 % injection 3 mL  3 mL Intravenous Q12H Herminio Commons, MD        Physical Exam: Vitals:   12/03/19 1013  BP: 110/70  Pulse: 68  SpO2: 96%  Weight: 190 lb 12.8 oz (86.5 kg)  Height: 5\' 8"  (1.727 m)    GEN- The patient is elderly appearing, alert and oriented x 3 today.   Head- normocephalic, atraumatic Eyes-  Sclera clear, conjunctiva pink Ears- hearing intact Oropharynx- clear Lungs-   normal work of breathing Chest- pacemaker pocket is well healed Heart- Regular rate and rhythm  GI- soft  Extremities- no clubbing, cyanosis, +2 edema  Echo 10/01/18 again reviewed today  Pacemaker interrogation- reviewed in detail today,  See PACEART report   Assessment and Plan:  1. Symptomatic sinus bradycardia  Normal pacemaker function See Pace Art report No changes today he is not device dependant today  2. Paroxysmal atrial fibrilation Burden is <1% chads2vasc score is at least 3.  He is on xarelto  3. HTN Stable No change required today   Risks, benefits and potential toxicities for medications prescribed and/or refilled reviewed with patient today.   Return in a year  [name], [facility] 12/03/2019 10:31 AM

## 2019-12-17 ENCOUNTER — Ambulatory Visit
Admission: RE | Admit: 2019-12-17 | Discharge: 2019-12-17 | Disposition: A | Discharge status: Home / Self Care | Payer: Medicare Other | Source: Ambulatory Visit | Attending: Vascular Surgery | Admitting: Vascular Surgery

## 2019-12-17 ENCOUNTER — Other Ambulatory Visit: Payer: Self-pay

## 2019-12-17 DIAGNOSIS — I714 Abdominal aortic aneurysm, without rupture, unspecified: Secondary | ICD-10-CM

## 2019-12-17 DIAGNOSIS — K449 Diaphragmatic hernia without obstruction or gangrene: Secondary | ICD-10-CM | POA: Diagnosis not present

## 2019-12-17 DIAGNOSIS — I712 Thoracic aortic aneurysm, without rupture: Secondary | ICD-10-CM | POA: Diagnosis not present

## 2019-12-17 DIAGNOSIS — K5730 Diverticulosis of large intestine without perforation or abscess without bleeding: Secondary | ICD-10-CM | POA: Diagnosis not present

## 2019-12-17 MED ORDER — IOPAMIDOL (ISOVUE-370) INJECTION 76%
75.0000 mL | Freq: Once | INTRAVENOUS | Status: AC | PRN
  Administered 2019-12-17: 75 mL via INTRAVENOUS

## 2019-12-22 ENCOUNTER — Ambulatory Visit (INDEPENDENT_AMBULATORY_CARE_PROVIDER_SITE_OTHER): Payer: Medicare Other | Admitting: Vascular Surgery

## 2019-12-22 ENCOUNTER — Other Ambulatory Visit: Payer: Self-pay

## 2019-12-22 ENCOUNTER — Encounter: Payer: Self-pay | Admitting: Vascular Surgery

## 2019-12-22 VITALS — BP 132/82 | HR 62 | Temp 97.6°F | Resp 18 | Ht 68.0 in | Wt 185.0 lb

## 2019-12-22 DIAGNOSIS — I714 Abdominal aortic aneurysm, without rupture, unspecified: Secondary | ICD-10-CM

## 2019-12-22 NOTE — Progress Notes (Signed)
REASON FOR VISIT:   Follow-up of abdominal aortic aneurysm.  MEDICAL ISSUES:   ABDOMINAL AORTIC ANEURYSM: This patient has a 6 cm infrarenal abdominal aortic aneurysm. I think the risk for rupture of an aneurysm of this size is 10 to 15 %/year and I explained this to the patient and his son. Based on his CT scan he would be a candidate for endovascular repair and for this reason I think it would be reasonable to consider elective repair of his aneurysm. In the past he has felt quite strongly about not wanting any surgery and this is certainly understandable given his age. We have discussed the procedure itself and the potential complications and again I think he would be a candidate for endovascular repair which could likely be done percutaneously. He would obviously need preoperative cardiac clearance. He seems very reluctant to consider any surgery and therefore wants to think about this which is perfectly understandable. I have ordered a follow-up ultrasound in 6 months and I will see him back at that time and this we hear from him sooner. He is not a smoker and his blood pressures under good control. He knows to get to the emergency department if he develops sudden abdominal pain or back pain given that it could be from his aneurysm.   HPI:   [name] is a pleasant 86 y.o. male who I last saw in September of last year for follow-up of his abdominal aortic aneurysm.  That time the maximum diameter was 5.47 cm.  I explained that in a normal risk patient would be consider elective repair at 5.5 cm.  However given the was 90 he would be at increased risk for intervention.  He is opposed to an aggressive approach of his aneurysm given his age.  However I recommended a follow-up CT scan so that we could determine if he would be a candidate for endovascular approach if it did continue to enlarge.  He comes in to discuss these results.  A CT scan was done on 12/17/2019.  Since I saw him last,  he denies any sudden onset of abdominal pain or back pain. He has some chronic vague abdominal pain and back pain.  He is 86 years old and is independent lives with his wife. His activity is very limited. I do not get any history of claudication or rest pain.  Past Medical History:  Diagnosis Date  . AAA (abdominal aortic aneurysm) ([place])   . Atrial fibrillation (HCC)    bicarbonate perfusion study 10/11 EF 69%. small defects no ischemia. 7 metastases acheived. ER visit 11/10/09 ruled out MI normal sinus rhythm orthostasis after sublingual nitroglycerin   . BPH (benign prostatic hyperplasia)   . Chronic back pain   . GERD (gastroesophageal reflux disease)   . Hypertension   . Orthostatic hypotension   . PONV (postoperative nausea and vomiting)   . Sleep apnea    CPAP at night  . Stress fracture of foot    left  . Swelling of both lower extremities     Family History  Problem Relation Age of Onset  . Heart failure Mother   . Heart failure Father   . Cancer Sister     SOCIAL HISTORY: Social History   Tobacco Use  . Smoking status: Former Smoker    Packs/day: 1.00    Years: 10.00    Pack years: 10.00    Types: Cigarettes    Start date: 10/15/1943    Quit  date: 01/28/1954    Years since quitting: 65.9  . Smokeless tobacco: Never Used  . Tobacco comment: tobacco use - no  Substance Use Topics  . Alcohol use: No    Alcohol/week: 0.0 standard drinks    Allergies  Allergen Reactions  . Flexeril [Cyclobenzaprine Hcl] Nausea And Vomiting  . Percocet [Oxycodone-Acetaminophen] Nausea And Vomiting  . Vicodin [Hydrocodone-Acetaminophen] Nausea And Vomiting  . Sulfonamide Derivatives Rash    Current Outpatient Medications  Medication Sig Dispense Refill  . alfuzosin (UROXATRAL) 10 MG 24 hr tablet Take 10 mg by mouth daily with breakfast.    . CALCIUM-MAGNESIUM-ZINC PO Take 1 tablet by mouth daily.    . cholecalciferol (VITAMIN D3) 25 MCG (1000 UT) tablet Take 1,000 Units by  mouth daily.    . dorzolamide (TRUSOPT) 2 % ophthalmic solution Place 1 drop into the right eye 2 (two) times daily.     Marland Kitchen esomeprazole (NEXIUM) 20 MG capsule Take 20 mg by mouth daily.    . finasteride (PROSCAR) 5 MG tablet Take 5 mg by mouth daily.      . furosemide (LASIX) 40 MG tablet TAKE 1 TABLET BY MOUTH EVERY DAY 90 tablet 1  . latanoprost (XALATAN) 0.005 % ophthalmic solution Place 1 drop into both eyes at bedtime.     . metoprolol tartrate (LOPRESSOR) 25 MG tablet TAKE 1/2 TABLET BY MOUTH TWICE DAILY (Patient taking differently: Take 12.5 mg by mouth daily. ) 90 tablet 1  . Omega-3 Fatty Acids (OMEGA-3 2100 PO) Take 2,100 mg by mouth daily.    . potassium chloride SA (KLOR-CON) 20 MEQ tablet TAKE 1 TABLET BY MOUTH EVERY DAY 90 tablet 2  . rivaroxaban (XARELTO) 20 MG TABS tablet TAKE 1 TABLET BY MOUTH DAILY WITH SUPPER, RESTART ON 6/26 30 tablet 6  . UNABLE TO FIND at bedtime. On CPAP     Current Facility-Administered Medications  Medication Dose Route Frequency Provider Last Rate Last Admin  . sodium chloride flush (NS) 0.9 % injection 3 mL  3 mL Intravenous Q12H Herminio Commons, MD        REVIEW OF SYSTEMS:  [X]  denotes positive finding, [ ]  denotes negative finding Cardiac  Comments:  Chest pain or chest pressure:    Shortness of breath upon exertion:    Short of breath when lying flat:    Irregular heart rhythm:        Vascular    Pain in calf, thigh, or hip brought on by ambulation:    Pain in feet at night that wakes you up from your sleep:     Blood clot in your veins:    Leg swelling:         Pulmonary    Oxygen at home:    Productive cough:     Wheezing:         Neurologic    Sudden weakness in arms or legs:     Sudden numbness in arms or legs:     Sudden onset of difficulty speaking or slurred speech:    Temporary loss of vision in one eye:     Problems with dizziness:         Gastrointestinal    Blood in stool:     Vomited blood:           Genitourinary    Burning when urinating:     Blood in urine:        Psychiatric    Major depression:  Hematologic    Bleeding problems:    Problems with blood clotting too easily:        Skin    Rashes or ulcers:        Constitutional    Fever or chills:     PHYSICAL EXAM:   Vitals:   12/22/19 1129  BP: 132/82  Pulse: 62  Resp: 18  Temp: 97.6 F (36.4 C)  TempSrc: Temporal  SpO2: 97%  Weight: 185 lb (83.9 kg)  Height: 5\' 8"  (1.727 m)   Body mass index is 28.13 kg/m.  GENERAL: The patient is a well-nourished male, in no acute distress. The vital signs are documented above. CARDIAC: There is a regular rate and rhythm.  VASCULAR: I do not detect carotid bruits. He has palpable femoral pulses and a palpable left dorsalis pedis pulse. I otherwise cannot palpate pedal pulses however he has reasonable Doppler signals in the dorsalis pedis and posterior tibial positions bilaterally. He has bilateral lower extremity swelling. PULMONARY: There is good air exchange bilaterally without wheezing or rales. ABDOMEN: Soft and non-tender with normal pitched bowel sounds. I cannot palpate his aneurysm. MUSCULOSKELETAL: There are no major deformities or cyanosis. NEUROLOGIC: No focal weakness or paresthesias are detected. SKIN: There are no ulcers or rashes noted. PSYCHIATRIC: The patient has a normal affect.  DATA:    CT ANGIO ABDOMEN PELVIS: I reviewed the CT angiogram of the abdomen and pelvis that was done on 12/17/2019.  The largest measurement I get is about 5.9 cm which has increased some compared to 5.47 cm by duplex back in September.  He has no significant iliac occlusive disease and has a reasonable neck thus that I think he would be a candidate for endovascular repair based on these findings.  Deitra Mayo [facility] [phone]

## 2019-12-29 ENCOUNTER — Ambulatory Visit (INDEPENDENT_AMBULATORY_CARE_PROVIDER_SITE_OTHER): Payer: Medicare Other

## 2019-12-29 DIAGNOSIS — I495 Sick sinus syndrome: Secondary | ICD-10-CM | POA: Diagnosis not present

## 2019-12-29 LAB — CUP PACEART REMOTE DEVICE CHECK
Battery Remaining Longevity: 128 mo
Battery Remaining Percentage: 95.5 %
Battery Voltage: 3.02 V
Brady Statistic AP VP Percent: 1 %
Brady Statistic AP VS Percent: 91 %
Brady Statistic AS VP Percent: 1 %
Brady Statistic AS VS Percent: 8.3 %
Brady Statistic RA Percent Paced: 90 %
Brady Statistic RV Percent Paced: 1 %
Date Time Interrogation Session: 20211201020013
Implantable Lead Implant Date: 20191105
Implantable Lead Implant Date: 20191105
Implantable Lead Location: 753859
Implantable Lead Location: 753860
Implantable Pulse Generator Implant Date: 20191105
Lead Channel Impedance Value: 410 Ohm
Lead Channel Impedance Value: 410 Ohm
Lead Channel Pacing Threshold Amplitude: 0.25 V
Lead Channel Pacing Threshold Amplitude: 0.75 V
Lead Channel Pacing Threshold Pulse Width: 0.5 ms
Lead Channel Pacing Threshold Pulse Width: 0.5 ms
Lead Channel Sensing Intrinsic Amplitude: 2.5 mV
Lead Channel Sensing Intrinsic Amplitude: 7.6 mV
Lead Channel Setting Pacing Amplitude: 1 V
Lead Channel Setting Pacing Amplitude: 1.25 V
Lead Channel Setting Pacing Pulse Width: 0.5 ms
Lead Channel Setting Sensing Sensitivity: 2 mV
Pulse Gen Model: 2272
Pulse Gen Serial Number: 9079121

## 2019-12-30 ENCOUNTER — Other Ambulatory Visit: Payer: Self-pay | Admitting: Internal Medicine

## 2020-01-04 NOTE — Progress Notes (Signed)
Remote pacemaker transmission.   

## 2020-01-12 DIAGNOSIS — Z23 Encounter for immunization: Secondary | ICD-10-CM | POA: Diagnosis not present

## 2020-01-31 ENCOUNTER — Other Ambulatory Visit: Payer: Self-pay | Admitting: Family Medicine

## 2020-02-08 DIAGNOSIS — J209 Acute bronchitis, unspecified: Secondary | ICD-10-CM | POA: Diagnosis not present

## 2020-02-08 DIAGNOSIS — I480 Paroxysmal atrial fibrillation: Secondary | ICD-10-CM | POA: Diagnosis not present

## 2020-02-08 DIAGNOSIS — I5032 Chronic diastolic (congestive) heart failure: Secondary | ICD-10-CM | POA: Diagnosis not present

## 2020-02-08 DIAGNOSIS — Z950 Presence of cardiac pacemaker: Secondary | ICD-10-CM | POA: Diagnosis not present

## 2020-02-09 DIAGNOSIS — M79672 Pain in left foot: Secondary | ICD-10-CM | POA: Diagnosis not present

## 2020-02-09 DIAGNOSIS — L110 Acquired keratosis follicularis: Secondary | ICD-10-CM | POA: Diagnosis not present

## 2020-02-09 DIAGNOSIS — I739 Peripheral vascular disease, unspecified: Secondary | ICD-10-CM | POA: Diagnosis not present

## 2020-02-09 DIAGNOSIS — M79671 Pain in right foot: Secondary | ICD-10-CM | POA: Diagnosis not present

## 2020-02-29 ENCOUNTER — Other Ambulatory Visit: Payer: Self-pay | Admitting: Student

## 2020-03-10 ENCOUNTER — Telehealth: Payer: Self-pay | Admitting: Family Medicine

## 2020-03-10 NOTE — Telephone Encounter (Signed)
Spoke to pharmacy confirmed patient is taking metoprolol tartrate 12.5 mg once daily after discussing with Barnetta Chapel, Therapist, sports

## 2020-03-10 NOTE — Telephone Encounter (Signed)
[facility] called requesting clarification of metoprolol.

## 2020-03-27 DIAGNOSIS — M62838 Other muscle spasm: Secondary | ICD-10-CM | POA: Diagnosis not present

## 2020-03-27 DIAGNOSIS — Z6828 Body mass index (BMI) 28.0-28.9, adult: Secondary | ICD-10-CM | POA: Diagnosis not present

## 2020-03-29 ENCOUNTER — Ambulatory Visit (INDEPENDENT_AMBULATORY_CARE_PROVIDER_SITE_OTHER): Payer: Medicare Other

## 2020-03-29 DIAGNOSIS — I495 Sick sinus syndrome: Secondary | ICD-10-CM

## 2020-03-30 LAB — CUP PACEART REMOTE DEVICE CHECK
Battery Remaining Longevity: 129 mo
Battery Remaining Percentage: 95.5 %
Battery Voltage: 3.02 V
Brady Statistic AP VP Percent: 1 %
Brady Statistic AP VS Percent: 89 %
Brady Statistic AS VP Percent: 1 %
Brady Statistic AS VS Percent: 10 %
Brady Statistic RA Percent Paced: 88 %
Brady Statistic RV Percent Paced: 1 %
Date Time Interrogation Session: 20220302020014
Implantable Lead Implant Date: 20191105
Implantable Lead Implant Date: 20191105
Implantable Lead Location: 753859
Implantable Lead Location: 753860
Implantable Pulse Generator Implant Date: 20191105
Lead Channel Impedance Value: 430 Ohm
Lead Channel Impedance Value: 430 Ohm
Lead Channel Pacing Threshold Amplitude: 0.25 V
Lead Channel Pacing Threshold Amplitude: 0.75 V
Lead Channel Pacing Threshold Pulse Width: 0.5 ms
Lead Channel Pacing Threshold Pulse Width: 0.5 ms
Lead Channel Sensing Intrinsic Amplitude: 3.3 mV
Lead Channel Sensing Intrinsic Amplitude: 8.1 mV
Lead Channel Setting Pacing Amplitude: 1 V
Lead Channel Setting Pacing Amplitude: 1.25 V
Lead Channel Setting Pacing Pulse Width: 0.5 ms
Lead Channel Setting Sensing Sensitivity: 2 mV
Pulse Gen Model: 2272
Pulse Gen Serial Number: 9079121

## 2020-04-06 NOTE — Progress Notes (Signed)
Remote pacemaker transmission.   

## 2020-04-26 DIAGNOSIS — L110 Acquired keratosis follicularis: Secondary | ICD-10-CM | POA: Diagnosis not present

## 2020-04-26 DIAGNOSIS — M19011 Primary osteoarthritis, right shoulder: Secondary | ICD-10-CM | POA: Diagnosis not present

## 2020-04-26 DIAGNOSIS — I739 Peripheral vascular disease, unspecified: Secondary | ICD-10-CM | POA: Diagnosis not present

## 2020-04-26 DIAGNOSIS — M79672 Pain in left foot: Secondary | ICD-10-CM | POA: Diagnosis not present

## 2020-04-26 DIAGNOSIS — I130 Hypertensive heart and chronic kidney disease with heart failure and stage 1 through stage 4 chronic kidney disease, or unspecified chronic kidney disease: Secondary | ICD-10-CM | POA: Diagnosis not present

## 2020-04-26 DIAGNOSIS — E7849 Other hyperlipidemia: Secondary | ICD-10-CM | POA: Diagnosis not present

## 2020-04-26 DIAGNOSIS — M79671 Pain in right foot: Secondary | ICD-10-CM | POA: Diagnosis not present

## 2020-04-26 DIAGNOSIS — I5032 Chronic diastolic (congestive) heart failure: Secondary | ICD-10-CM | POA: Diagnosis not present

## 2020-04-27 ENCOUNTER — Other Ambulatory Visit: Payer: Self-pay | Admitting: Internal Medicine

## 2020-04-27 NOTE — Telephone Encounter (Signed)
This is a [facility] pt °

## 2020-05-09 ENCOUNTER — Other Ambulatory Visit: Payer: Self-pay

## 2020-05-09 DIAGNOSIS — N401 Enlarged prostate with lower urinary tract symptoms: Secondary | ICD-10-CM

## 2020-05-09 MED ORDER — FINASTERIDE 5 MG PO TABS: 5.0000 mg | ORAL_TABLET | Freq: Every day | ORAL | 1 refills | Status: DC

## 2020-05-09 NOTE — Progress Notes (Signed)
Patient med refill until June appointment.

## 2020-05-29 DIAGNOSIS — H401113 Primary open-angle glaucoma, right eye, severe stage: Secondary | ICD-10-CM | POA: Diagnosis not present

## 2020-06-07 ENCOUNTER — Encounter: Payer: Self-pay | Admitting: Vascular Surgery

## 2020-06-07 ENCOUNTER — Ambulatory Visit (INDEPENDENT_AMBULATORY_CARE_PROVIDER_SITE_OTHER): Payer: Medicare Other | Admitting: Vascular Surgery

## 2020-06-07 ENCOUNTER — Other Ambulatory Visit: Payer: Self-pay

## 2020-06-07 ENCOUNTER — Ambulatory Visit (HOSPITAL_COMMUNITY)
Admission: RE | Admit: 2020-06-07 | Discharge: 2020-06-07 | Disposition: A | Discharge status: Home / Self Care | Payer: Medicare Other | Source: Ambulatory Visit | Attending: Family Medicine | Admitting: Family Medicine

## 2020-06-07 ENCOUNTER — Other Ambulatory Visit (HOSPITAL_COMMUNITY): Payer: Self-pay | Admitting: Vascular Surgery

## 2020-06-07 VITALS — BP 134/87 | HR 61 | Temp 98.1°F | Resp 20 | Ht 68.0 in | Wt 183.0 lb

## 2020-06-07 DIAGNOSIS — I714 Abdominal aortic aneurysm, without rupture, unspecified: Secondary | ICD-10-CM

## 2020-06-07 DIAGNOSIS — Z0000 Encounter for general adult medical examination without abnormal findings: Secondary | ICD-10-CM

## 2020-06-07 NOTE — Progress Notes (Signed)
REASON FOR VISIT:   Follow-up of abdominal aortic aneurysm  MEDICAL ISSUES:    ABDOMINAL AORTIC ANEURYSM: His aneurysm is stable at 5.9 cm.  We again had a long discussion about considering elective repair given that he is a candidate for endovascular repair.  However he feels strongly about not having surgery given his age I think this is perfectly reasonable.  Given that he really does not want to have surgery I have offered the option of not repeating his duplex scan however he would like to have this checked again in a year and again I think that is reasonable.  I also encouraged him to think about what he would want to do if the aneurysm ruptured.  At this point I think he would not be interested in having attempted emergent repair.  He has given this a lot of thought and seems to be very comfortable with this decision.    HPI:   [name] is a pleasant 87 y.o. male who I last saw on 12/22/2019.  At that time the patient had a 5.9 cm infrarenal abdominal aortic aneurysm by CT scan.  This had increased somewhat compared to 5.5 cm by duplex back in September.  Based on my review of the CT scan I felt that he might be a candidate for endovascular repair given that he had no significant iliac occlusive disease and a reasonable neck.  I explained to him that the risk of rupture for an aneurysm of this size was 10 to 15 %/year.  In the past it felt quite strongly about not wanting to undergo surgery and given his age I thought this was perfectly reasonable.  I explained that given that we could do an endovascular approach I thought that we could get him through the operation safely although certainly to be slightly increased risk because of his age.  However again he felt strongly about not having surgery.  This reason I ordered a follow-up ultrasound in 6 months.  He does have chronic back pain but no new onset abdominal pain or back pain.  I do not get any history of claudication  although I think his activity is fairly limited.  Past Medical History:  Diagnosis Date  . AAA (abdominal aortic aneurysm) ([place])   . Atrial fibrillation (HCC)    bicarbonate perfusion study 10/11 EF 69%. small defects no ischemia. 7 metastases acheived. ER visit 11/10/09 ruled out MI normal sinus rhythm orthostasis after sublingual nitroglycerin   . BPH (benign prostatic hyperplasia)   . Chronic back pain   . GERD (gastroesophageal reflux disease)   . Hypertension   . Orthostatic hypotension   . PONV (postoperative nausea and vomiting)   . Sleep apnea    CPAP at night  . Stress fracture of foot    left  . Swelling of both lower extremities     Family History  Problem Relation Age of Onset  . Heart failure Mother   . Heart failure Father   . Cancer Sister     SOCIAL HISTORY: Social History   Tobacco Use  . Smoking status: Former Smoker    Packs/day: 1.00    Years: 10.00    Pack years: 10.00    Types: Cigarettes    Start date: 10/15/1943    Quit date: 01/28/1954    Years since quitting: 66.4  . Smokeless tobacco: Never Used  . Tobacco comment: tobacco use - no  Substance Use Topics  . Alcohol use:  No    Alcohol/week: 0.0 standard drinks    Allergies  Allergen Reactions  . Flexeril [Cyclobenzaprine Hcl] Nausea And Vomiting  . Percocet [Oxycodone-Acetaminophen] Nausea And Vomiting  . Vicodin [Hydrocodone-Acetaminophen] Nausea And Vomiting  . Sulfonamide Derivatives Rash    Current Outpatient Medications  Medication Sig Dispense Refill  . alfuzosin (UROXATRAL) 10 MG 24 hr tablet Take 10 mg by mouth daily with breakfast.    . CALCIUM-MAGNESIUM-ZINC PO Take 1 tablet by mouth daily.    . cholecalciferol (VITAMIN D3) 25 MCG (1000 UT) tablet Take 1,000 Units by mouth daily.    . dorzolamide (TRUSOPT) 2 % ophthalmic solution Place 1 drop into the right eye 2 (two) times daily.    Marland Kitchen esomeprazole (NEXIUM) 20 MG capsule Take 20 mg by mouth daily.    . finasteride (PROSCAR)  5 MG tablet Take 1 tablet (5 mg total) by mouth daily. 30 tablet 1  . furosemide (LASIX) 40 MG tablet TAKE 1 TABLET BY MOUTH EVERY DAY 90 tablet 1  . latanoprost (XALATAN) 0.005 % ophthalmic solution Place 1 drop into both eyes at bedtime.    . metoprolol tartrate (LOPRESSOR) 25 MG tablet Take 0.5 tablets (12.5 mg total) by mouth daily. 30 tablet 6  . Omega-3 Fatty Acids (OMEGA-3 2100 PO) Take 2,100 mg by mouth daily.    . potassium chloride SA (KLOR-CON) 20 MEQ tablet TAKE 1 TABLET BY MOUTH EVERY DAY 90 tablet 2  . rivaroxaban (XARELTO) 20 MG TABS tablet TAKE 1 TABLET BY MOUTH DAILY WITH SUPPER, RESTART ON 6/26 30 tablet 6  . UNABLE TO FIND at bedtime. On CPAP     Current Facility-Administered Medications  Medication Dose Route Frequency Provider Last Rate Last Admin  . sodium chloride flush (NS) 0.9 % injection 3 mL  3 mL Intravenous Q12H Herminio Commons, MD        REVIEW OF SYSTEMS:  [X]  denotes positive finding, [ ]  denotes negative finding Cardiac  Comments:  Chest pain or chest pressure:    Shortness of breath upon exertion: x   Short of breath when lying flat:    Irregular heart rhythm:        Vascular    Pain in calf, thigh, or hip brought on by ambulation:    Pain in feet at night that wakes you up from your sleep:     Blood clot in your veins:    Leg swelling:         Pulmonary    Oxygen at home:    Productive cough:     Wheezing:         Neurologic    Sudden weakness in arms or legs:     Sudden numbness in arms or legs:     Sudden onset of difficulty speaking or slurred speech:    Temporary loss of vision in one eye:     Problems with dizziness:         Gastrointestinal    Blood in stool:     Vomited blood:         Genitourinary    Burning when urinating:     Blood in urine:        Psychiatric    Major depression:         Hematologic    Bleeding problems:    Problems with blood clotting too easily:        Skin    Rashes or ulcers:  Constitutional    Fever or chills:     PHYSICAL EXAM:   Vitals:   06/07/20 1137  BP: 134/87  Pulse: 61  Resp: 20  Temp: 98.1 F (36.7 C)  SpO2: 95%  Weight: 183 lb (83 kg)  Height: 5\' 8"  (1.727 m)   GENERAL: The patient is a well-nourished male, in no acute distress. The vital signs are documented above. CARDIAC: There is a regular rate and rhythm.  VASCULAR: I do not detect carotid bruits. He has palpable femoral pulses.  I cannot palpate pedal pulses but his feet are warm and well-perfused. PULMONARY: There is good air exchange bilaterally without wheezing or rales. ABDOMEN: Soft and non-tender with normal pitched bowel sounds.  His aneurysm is nontender. MUSCULOSKELETAL: There are no major deformities or cyanosis. NEUROLOGIC: No focal weakness or paresthesias are detected. SKIN: There are no ulcers or rashes noted. PSYCHIATRIC: The patient has a normal affect.  DATA:    DUPLEX ABDOMINAL AORTA: I have independently interpreted his duplex of the abdominal aorta.  The maximum diameter of his aneurysm is 5.9 cm which is not changed compared to the CT scan 6 months ago.  The right common iliac artery measures 1.4 cm in maximum diameter.  The left common iliac artery measures 1.2 cm in maximum diameter.  Deitra Mayo [facility] [phone]

## 2020-06-28 ENCOUNTER — Ambulatory Visit (INDEPENDENT_AMBULATORY_CARE_PROVIDER_SITE_OTHER): Payer: Medicare Other

## 2020-06-28 ENCOUNTER — Other Ambulatory Visit: Payer: Self-pay | Admitting: Urology

## 2020-06-28 DIAGNOSIS — N401 Enlarged prostate with lower urinary tract symptoms: Secondary | ICD-10-CM

## 2020-06-28 DIAGNOSIS — I495 Sick sinus syndrome: Secondary | ICD-10-CM | POA: Diagnosis not present

## 2020-06-29 ENCOUNTER — Ambulatory Visit (INDEPENDENT_AMBULATORY_CARE_PROVIDER_SITE_OTHER): Payer: Medicare Other | Admitting: Urology

## 2020-06-29 ENCOUNTER — Other Ambulatory Visit: Payer: Self-pay

## 2020-06-29 ENCOUNTER — Encounter: Payer: Self-pay | Admitting: Urology

## 2020-06-29 VITALS — BP 126/63 | HR 63 | Ht 68.0 in | Wt 190.0 lb

## 2020-06-29 DIAGNOSIS — N401 Enlarged prostate with lower urinary tract symptoms: Secondary | ICD-10-CM

## 2020-06-29 DIAGNOSIS — R351 Nocturia: Secondary | ICD-10-CM | POA: Diagnosis not present

## 2020-06-29 LAB — CUP PACEART REMOTE DEVICE CHECK
Battery Remaining Longevity: 128 mo
Battery Remaining Percentage: 95.5 %
Battery Voltage: 3.01 V
Brady Statistic AP VP Percent: 1 %
Brady Statistic AP VS Percent: 92 %
Brady Statistic AS VP Percent: 1 %
Brady Statistic AS VS Percent: 7.7 %
Brady Statistic RA Percent Paced: 91 %
Brady Statistic RV Percent Paced: 1 %
Date Time Interrogation Session: 20220601020012
Implantable Lead Implant Date: 20191105
Implantable Lead Implant Date: 20191105
Implantable Lead Location: 753859
Implantable Lead Location: 753860
Implantable Pulse Generator Implant Date: 20191105
Lead Channel Impedance Value: 430 Ohm
Lead Channel Impedance Value: 460 Ohm
Lead Channel Pacing Threshold Amplitude: 0.25 V
Lead Channel Pacing Threshold Amplitude: 0.75 V
Lead Channel Pacing Threshold Pulse Width: 0.5 ms
Lead Channel Pacing Threshold Pulse Width: 0.5 ms
Lead Channel Sensing Intrinsic Amplitude: 2.7 mV
Lead Channel Sensing Intrinsic Amplitude: 9.9 mV
Lead Channel Setting Pacing Amplitude: 1 V
Lead Channel Setting Pacing Amplitude: 1.25 V
Lead Channel Setting Pacing Pulse Width: 0.5 ms
Lead Channel Setting Sensing Sensitivity: 2 mV
Pulse Gen Model: 2272
Pulse Gen Serial Number: 9079121

## 2020-06-29 LAB — URINALYSIS, ROUTINE W REFLEX MICROSCOPIC
Bilirubin, UA: NEGATIVE
Glucose, UA: NEGATIVE
Ketones, UA: NEGATIVE
Leukocytes,UA: NEGATIVE
Nitrite, UA: NEGATIVE
Protein,UA: NEGATIVE
Specific Gravity, UA: 1.015 (ref 1.005–1.030)
Urobilinogen, Ur: 0.2 mg/dL (ref 0.2–1.0)
pH, UA: 6 (ref 5.0–7.5)

## 2020-06-29 LAB — MICROSCOPIC EXAMINATION
Bacteria, UA: NONE SEEN
Epithelial Cells (non renal): NONE SEEN /hpf (ref 0–10)
RBC, Urine: NONE SEEN /hpf (ref 0–2)
Renal Epithel, UA: NONE SEEN /hpf
WBC, UA: NONE SEEN /hpf (ref 0–5)

## 2020-06-29 MED ORDER — FINASTERIDE 5 MG PO TABS: 5.0000 mg | ORAL_TABLET | Freq: Every day | ORAL | 3 refills | Status: DC

## 2020-06-29 MED ORDER — ALFUZOSIN HCL ER 10 MG PO TB24: 10.0000 mg | ORAL_TABLET | Freq: Every day | ORAL | 3 refills | Status: DC

## 2020-06-29 NOTE — Progress Notes (Signed)
Subjective:  1. Benign localized prostatic hyperplasia with lower urinary tract symptoms (LUTS)   2. Nocturia     [name] returns today in f/u for his history of BPH with BOO.  He is on finasteride and alfuzosin. His UA is clear and his IPSS is 4 with nocturia x 1.  He has had no associated signs or syptoms.   He has some chronic right low back pain.    IPSS    Row Name 06/29/20 1500         International Prostate Symptom Score   How often have you had the sensation of not emptying your bladder? Not at All     How often have you had to urinate less than every two hours? Less than 1 in 5 times     How often have you found you stopped and started again several times when you urinated? Less than 1 in 5 times     How often have you found it difficult to postpone urination? Less than 1 in 5 times     How often have you had a weak urinary stream? Not at All     How often have you had to strain to start urination? Not at All     How many times did you typically get up at night to urinate? 1 Time     Total IPSS Score 4           Quality of Life due to urinary symptoms   If you were to spend the rest of your life with your urinary condition just the way it is now how would you feel about that? Mostly Satisfied             ROS:  ROS:  A complete review of systems was performed.  All systems are negative except for pertinent findings as noted.   ROS  Allergies  Allergen Reactions  . Flexeril [Cyclobenzaprine Hcl] Nausea And Vomiting  . Percocet [Oxycodone-Acetaminophen] Nausea And Vomiting  . Vicodin [Hydrocodone-Acetaminophen] Nausea And Vomiting  . Sulfonamide Derivatives Rash    Outpatient Encounter Medications as of 06/29/2020  Medication Sig  . CALCIUM-MAGNESIUM-ZINC PO Take 1 tablet by mouth daily.  . cholecalciferol (VITAMIN D3) 25 MCG (1000 UT) tablet Take 1,000 Units by mouth daily.  . dorzolamide (TRUSOPT) 2 % ophthalmic solution Place 1 drop into the right eye 2  (two) times daily.  Marland Kitchen esomeprazole (NEXIUM) 20 MG capsule Take 20 mg by mouth daily.  . furosemide (LASIX) 40 MG tablet TAKE 1 TABLET BY MOUTH EVERY DAY  . latanoprost (XALATAN) 0.005 % ophthalmic solution Place 1 drop into both eyes at bedtime.  . metoprolol tartrate (LOPRESSOR) 25 MG tablet Take 0.5 tablets (12.5 mg total) by mouth daily.  . Omega-3 Fatty Acids (OMEGA-3 2100 PO) Take 2,100 mg by mouth daily.  . potassium chloride SA (KLOR-CON) 20 MEQ tablet TAKE 1 TABLET BY MOUTH EVERY DAY  . rivaroxaban (XARELTO) 20 MG TABS tablet TAKE 1 TABLET BY MOUTH DAILY WITH SUPPER, RESTART ON 6/26  . UNABLE TO FIND at bedtime. On CPAP  . [DISCONTINUED] alfuzosin (UROXATRAL) 10 MG 24 hr tablet Take 10 mg by mouth daily with breakfast.  . [DISCONTINUED] finasteride (PROSCAR) 5 MG tablet Take 1 tablet (5 mg total) by mouth daily.  Marland Kitchen alfuzosin (UROXATRAL) 10 MG 24 hr tablet Take 1 tablet (10 mg total) by mouth daily with breakfast.  . finasteride (PROSCAR) 5 MG tablet Take 1 tablet (5 mg total) by  mouth daily.   Facility-Administered Encounter Medications as of 06/29/2020  Medication  . sodium chloride flush (NS) 0.9 % injection 3 mL    Past Medical History:  Diagnosis Date  . AAA (abdominal aortic aneurysm) ([place])   . Atrial fibrillation (HCC)    bicarbonate perfusion study 10/11 EF 69%. small defects no ischemia. 7 metastases acheived. ER visit 11/10/09 ruled out MI normal sinus rhythm orthostasis after sublingual nitroglycerin   . BPH (benign prostatic hyperplasia)   . Chronic back pain   . GERD (gastroesophageal reflux disease)   . Hypertension   . Orthostatic hypotension   . PONV (postoperative nausea and vomiting)   . Sleep apnea    CPAP at night  . Stress fracture of foot    left  . Swelling of both lower extremities     Past Surgical History:  Procedure Laterality Date  . CHOLECYSTECTOMY  2012   [facility] by Dr. Geroge Baseman  . COLONOSCOPY N/A 08/27/2012   Procedure: COLONOSCOPY;   Surgeon: Rogene Houston, MD;  [facility];  Service: Endoscopy;  Laterality: N/A;  325  . COLONOSCOPY N/A 01/12/2018   Procedure: COLONOSCOPY;  Surgeon: Rogene Houston, MD;  [facility];  Service: Endoscopy;  Laterality: N/A;  Has staff Christmas party at his house at lunch today per office  . COLONOSCOPY N/A 07/21/2019   Procedure: COLONOSCOPY;  Surgeon: Rogene Houston, MD;  [facility];  Service: Endoscopy;  Laterality: N/A;  . HERNIA REPAIR Bilateral    Inguinal and umbilical  . INCISIONAL HERNIA REPAIR N/A 01/31/2014   Procedure: Fatima Blank HERNIORRHAPHY WITH MESH;  Surgeon: Jamesetta So, MD;  Location: [facility];  Service: General;  Laterality: N/A;  . INSERTION OF MESH N/A 01/31/2014   Procedure: INSERTION OF MESH;  Surgeon: Jamesetta So, MD;  Location: [facility];  Service: General;  Laterality: N/A;  . PACEMAKER IMPLANT N/A 12/02/2017   St Jude Medical Assurity MRI conditional  dual-chamber pacemaker by Dr Rayann Heman for symptomatic sinus bradycardia  . POLYPECTOMY  01/12/2018   Procedure: POLYPECTOMY;  Surgeon: Rogene Houston, MD;  [facility];  Service: Endoscopy;;  transverse colon polyp, ascending colon polyps x2, hepatic flexure polyps x2   . POLYPECTOMY  07/21/2019   Procedure: POLYPECTOMY;  Surgeon: Rogene Houston, MD;  [facility];  Service: Endoscopy;;  . SHOULDER SURGERY Right 2005?   for open rotator cuff repair. [place]  . WOUND DEBRIDEMENT Right 01/31/2014   Procedure: DEBRIDEMENT WOUND RIGHT LEG;  Surgeon: Jamesetta So, MD;  Location: [facility];  Service: General;  Laterality: Right;  . WRIST FRACTURE SURGERY Right     Social History   Socioeconomic History  . Marital status: Divorced    Spouse name: Not on file  . Number of children: Not on file  . Years of education: Not on file  . Highest education level: Not on file  Occupational History  . Not on file  Tobacco Use  . Smoking status:  Former Smoker    Packs/day: 1.00    Years: 10.00    Pack years: 10.00    Types: Cigarettes    Start date: 10/15/1943    Quit date: 01/28/1954    Years since quitting: 66.4  . Smokeless tobacco: Never Used  . Tobacco comment: tobacco use - no  Vaping Use  . Vaping Use: Never used  Substance and Sexual Activity  . Alcohol use: No  Alcohol/week: 0.0 standard drinks  . Drug use: No  . Sexual activity: Not on file  Other Topics Concern  . Not on file  Social History Narrative  . Not on file   Social Determinants of Health   Financial Resource Strain: Not on file  Food Insecurity: Not on file  Transportation Needs: Not on file  Physical Activity: Not on file  Stress: Not on file  Social Connections: Not on file  Intimate Partner Violence: Not on file    Family History  Problem Relation Age of Onset  . Heart failure Mother   . Heart failure Father   . Cancer Sister        Objective: Vitals:   06/29/20 1443  BP: 126/63  Pulse: 63     Physical Exam  Lab Results:  PSA No results found for: PSA No results found for: TESTOSTERONE  UA is clear.   Studies/Results: CUP PACEART REMOTE DEVICE CHECK  Result Date: 06/29/2020 Scheduled remote reviewed. Normal device function.  1 AHR event w/ EGM suggesting A Fib w/ controlled V rates. History of AF and prescribed rivaroxaban. Next remote 91 days. HB  Results for orders placed during the hospital encounter of 12/20/11  DG Abd 1 View  Narrative *RADIOLOGY REPORT*  Clinical Data: Kidney stone  ABDOMEN - 1 VIEW  Comparison: 11/15/2010  Findings: Moderate stool overlies both kidneys which could impair visualization of  renal calculi.  No renal calculi are identified. Vascular calcifications in the pelvis are unchanged.  Nonobstructive bowel gas pattern.  No acute bony abnormality.  IMPRESSION: Constipation.  No renal calculi are identified but could be obscured by stool.   Original Report Authenticated  By: Carl Best, M.D.  No results found for this or any previous visit.  No results found for this or any previous visit.  No results found for this or any previous visit.  Results for orders placed during the hospital encounter of 08/10/15  US Renal  Narrative CLINICAL DATA:  Follow-up kidney stone. History of cholecystectomy. History of renal cysts, BPH, and hypertension.  EXAM: RENAL / URINARY TRACT ULTRASOUND COMPLETE  COMPARISON:  CT head 2016  FINDINGS: Right Kidney:  Length: 11.6 cm. Normal echogenicity. No hydronephrosis. Multiple cysts are present, largest measuring 4.5 x 2.6 x 3.7 cm .  Left Kidney:  Length: 12.5 cm. Normal renal echogenicity. No hydronephrosis. Numerous cysts are identified. Largest is 2.1 x 1.7 x 1.9 cm.  Bladder:  Appears normal for degree of bladder distention. Prominent impression of the prostate gland noted. The prostate gland appears heterogeneous.  IMPRESSION: 1. Multiple bilateral renal cysts.  No hydronephrosis. 2. No sonographically detected intrarenal calculi. 3. Enlarged, heterogeneous appearance of the prostate gland.   Electronically Signed By: Nolon Nations M.D. On: 08/10/2015 11:35  No results found for this or any previous visit.  No results found for this or any previous visit.  No results found for this or any previous visit.    Assessment & Plan: BPH with BOO.  He is doing well on current therapy.    Meds ordered this encounter  Medications  . alfuzosin (UROXATRAL) 10 MG 24 hr tablet    Sig: Take 1 tablet (10 mg total) by mouth daily with breakfast.    Dispense:  90 tablet    Refill:  3  . finasteride (PROSCAR) 5 MG tablet    Sig: Take 1 tablet (5 mg total) by mouth daily.    Dispense:  90 tablet    Refill:  3     Orders Placed This Encounter  Procedures  . Urinalysis, Routine w reflex microscopic      Return in about 1 year (around 06/29/2021).   CC: Burdine, Virgina Evener, MD      Irine Seal 06/29/2020 Patient ID: Lelan Pons, male   DOB: [birth year], 87 y.o.   MRN: [id number]

## 2020-06-29 NOTE — Progress Notes (Signed)
Urological Symptom Review        Patient is experiencing the following symptoms: Get up at night to urinate   Review of Systems  Gastrointestinal (upper)  : Negative for upper GI symptoms  Gastrointestinal (lower) : Negative for lower GI symptoms  Constitutional : Negative for symptoms  Skin: Negative for skin symptoms  Eyes: Negative for eye symptoms  Ear/Nose/Throat : Negative for Ear/Nose/Throat symptoms  Hematologic/Lymphatic: Easy bruising  Cardiovascular : Leg swelling  Respiratory : Negative for respiratory symptoms  Endocrine: Negative for endocrine symptoms  Musculoskeletal: Back pain  Neurological: Negative for neurological symptoms  Psychologic: Negative for psychiatric symptoms

## 2020-07-05 ENCOUNTER — Other Ambulatory Visit: Payer: Self-pay | Admitting: Family Medicine

## 2020-07-05 NOTE — Telephone Encounter (Signed)
Prescription refill request for Xarelto received.  Indication: Atrial fib Last office visit: 12/29/19 / [facility] Weight: 86.5kg Age: 87 Scr: 1.09 on 11/05/19 CrCl: 54.01  Based on above findings Xarelto 20mg  daily is the appropriate dose.  Refill approved.

## 2020-07-11 DIAGNOSIS — Z23 Encounter for immunization: Secondary | ICD-10-CM | POA: Diagnosis not present

## 2020-07-11 DIAGNOSIS — Z6828 Body mass index (BMI) 28.0-28.9, adult: Secondary | ICD-10-CM | POA: Diagnosis not present

## 2020-07-11 DIAGNOSIS — S61419A Laceration without foreign body of unspecified hand, initial encounter: Secondary | ICD-10-CM | POA: Diagnosis not present

## 2020-07-19 DIAGNOSIS — L0390 Cellulitis, unspecified: Secondary | ICD-10-CM | POA: Diagnosis not present

## 2020-07-19 DIAGNOSIS — S61419A Laceration without foreign body of unspecified hand, initial encounter: Secondary | ICD-10-CM | POA: Diagnosis not present

## 2020-07-19 DIAGNOSIS — Z6828 Body mass index (BMI) 28.0-28.9, adult: Secondary | ICD-10-CM | POA: Diagnosis not present

## 2020-07-20 NOTE — Progress Notes (Signed)
Remote pacemaker transmission.   

## 2020-07-26 DIAGNOSIS — M79671 Pain in right foot: Secondary | ICD-10-CM | POA: Diagnosis not present

## 2020-07-26 DIAGNOSIS — I739 Peripheral vascular disease, unspecified: Secondary | ICD-10-CM | POA: Diagnosis not present

## 2020-07-26 DIAGNOSIS — M79672 Pain in left foot: Secondary | ICD-10-CM | POA: Diagnosis not present

## 2020-07-26 DIAGNOSIS — L110 Acquired keratosis follicularis: Secondary | ICD-10-CM | POA: Diagnosis not present

## 2020-07-27 ENCOUNTER — Other Ambulatory Visit: Payer: Self-pay | Admitting: Cardiology

## 2020-07-27 DIAGNOSIS — S61419A Laceration without foreign body of unspecified hand, initial encounter: Secondary | ICD-10-CM | POA: Diagnosis not present

## 2020-07-27 DIAGNOSIS — L0390 Cellulitis, unspecified: Secondary | ICD-10-CM | POA: Diagnosis not present

## 2020-07-27 NOTE — Telephone Encounter (Signed)
Rx(s) sent to pharmacy electronically.  

## 2020-08-27 DIAGNOSIS — M19011 Primary osteoarthritis, right shoulder: Secondary | ICD-10-CM | POA: Diagnosis not present

## 2020-08-27 DIAGNOSIS — I5032 Chronic diastolic (congestive) heart failure: Secondary | ICD-10-CM | POA: Diagnosis not present

## 2020-08-27 DIAGNOSIS — E7849 Other hyperlipidemia: Secondary | ICD-10-CM | POA: Diagnosis not present

## 2020-08-27 DIAGNOSIS — I130 Hypertensive heart and chronic kidney disease with heart failure and stage 1 through stage 4 chronic kidney disease, or unspecified chronic kidney disease: Secondary | ICD-10-CM | POA: Diagnosis not present

## 2020-09-12 DIAGNOSIS — H401113 Primary open-angle glaucoma, right eye, severe stage: Secondary | ICD-10-CM | POA: Diagnosis not present

## 2020-09-12 DIAGNOSIS — H401122 Primary open-angle glaucoma, left eye, moderate stage: Secondary | ICD-10-CM | POA: Diagnosis not present

## 2020-09-27 ENCOUNTER — Ambulatory Visit (INDEPENDENT_AMBULATORY_CARE_PROVIDER_SITE_OTHER): Payer: Medicare Other

## 2020-09-27 DIAGNOSIS — I495 Sick sinus syndrome: Secondary | ICD-10-CM | POA: Diagnosis not present

## 2020-09-27 LAB — CUP PACEART REMOTE DEVICE CHECK
Battery Remaining Longevity: 95 mo
Battery Remaining Percentage: 76 %
Battery Voltage: 3.02 V
Brady Statistic AP VP Percent: 1 %
Brady Statistic AP VS Percent: 92 %
Brady Statistic AS VP Percent: 1 %
Brady Statistic AS VS Percent: 7 %
Brady Statistic RA Percent Paced: 92 %
Brady Statistic RV Percent Paced: 1 %
Date Time Interrogation Session: 20220831020012
Implantable Lead Implant Date: 20191105
Implantable Lead Implant Date: 20191105
Implantable Lead Location: 753859
Implantable Lead Location: 753860
Implantable Pulse Generator Implant Date: 20191105
Lead Channel Impedance Value: 410 Ohm
Lead Channel Impedance Value: 450 Ohm
Lead Channel Pacing Threshold Amplitude: 0.25 V
Lead Channel Pacing Threshold Amplitude: 0.875 V
Lead Channel Pacing Threshold Pulse Width: 0.5 ms
Lead Channel Pacing Threshold Pulse Width: 0.5 ms
Lead Channel Sensing Intrinsic Amplitude: 2.1 mV
Lead Channel Sensing Intrinsic Amplitude: 8.8 mV
Lead Channel Setting Pacing Amplitude: 1.125
Lead Channel Setting Pacing Amplitude: 1.25 V
Lead Channel Setting Pacing Pulse Width: 0.5 ms
Lead Channel Setting Sensing Sensitivity: 2 mV
Pulse Gen Model: 2272
Pulse Gen Serial Number: 9079121

## 2020-10-10 NOTE — Progress Notes (Signed)
Remote pacemaker transmission.   

## 2020-10-26 ENCOUNTER — Other Ambulatory Visit: Payer: Self-pay | Admitting: Cardiology

## 2020-11-02 DIAGNOSIS — I739 Peripheral vascular disease, unspecified: Secondary | ICD-10-CM | POA: Diagnosis not present

## 2020-11-02 DIAGNOSIS — M79672 Pain in left foot: Secondary | ICD-10-CM | POA: Diagnosis not present

## 2020-11-02 DIAGNOSIS — M79671 Pain in right foot: Secondary | ICD-10-CM | POA: Diagnosis not present

## 2020-11-02 DIAGNOSIS — L110 Acquired keratosis follicularis: Secondary | ICD-10-CM | POA: Diagnosis not present

## 2020-11-07 DIAGNOSIS — Z20828 Contact with and (suspected) exposure to other viral communicable diseases: Secondary | ICD-10-CM | POA: Diagnosis not present

## 2020-11-13 DIAGNOSIS — Z23 Encounter for immunization: Secondary | ICD-10-CM | POA: Diagnosis not present

## 2020-12-01 ENCOUNTER — Ambulatory Visit (INDEPENDENT_AMBULATORY_CARE_PROVIDER_SITE_OTHER): Payer: Medicare Other | Admitting: Internal Medicine

## 2020-12-01 VITALS — BP 130/84 | HR 60 | Ht 68.0 in | Wt 190.0 lb

## 2020-12-01 DIAGNOSIS — D6869 Other thrombophilia: Secondary | ICD-10-CM | POA: Diagnosis not present

## 2020-12-01 DIAGNOSIS — I10 Essential (primary) hypertension: Secondary | ICD-10-CM

## 2020-12-01 DIAGNOSIS — I495 Sick sinus syndrome: Secondary | ICD-10-CM | POA: Diagnosis not present

## 2020-12-01 DIAGNOSIS — I480 Paroxysmal atrial fibrillation: Secondary | ICD-10-CM

## 2020-12-01 LAB — CUP PACEART INCLINIC DEVICE CHECK
Battery Remaining Longevity: 94 mo
Battery Voltage: 3.01 V
Brady Statistic RA Percent Paced: 91 %
Brady Statistic RV Percent Paced: 0.34 %
Date Time Interrogation Session: 20221104092016
Implantable Lead Implant Date: 20191105
Implantable Lead Implant Date: 20191105
Implantable Lead Location: 753859
Implantable Lead Location: 753860
Implantable Pulse Generator Implant Date: 20191105
Lead Channel Impedance Value: 412.5 Ohm
Lead Channel Impedance Value: 437.5 Ohm
Lead Channel Pacing Threshold Amplitude: 0.375 V
Lead Channel Pacing Threshold Amplitude: 0.875 V
Lead Channel Pacing Threshold Pulse Width: 0.5 ms
Lead Channel Pacing Threshold Pulse Width: 0.5 ms
Lead Channel Sensing Intrinsic Amplitude: 2.7 mV
Lead Channel Sensing Intrinsic Amplitude: 7.2 mV
Lead Channel Setting Pacing Amplitude: 1.125
Lead Channel Setting Pacing Amplitude: 1.375
Lead Channel Setting Pacing Pulse Width: 0.5 ms
Lead Channel Setting Sensing Sensitivity: 2 mV
Pulse Gen Model: 2272
Pulse Gen Serial Number: 9079121

## 2020-12-01 NOTE — Patient Instructions (Signed)
Medication Instructions:  Continue all current medications.  Labwork: BMET, CBC - orders given today.  Office will contact with results via phone or letter.     Testing/Procedures: none  Follow-Up: 1 year - Dr.  Rayann Heman   Any Other Special Instructions Will Be Listed Below (If Applicable).   If you need a refill on your cardiac medications before your next appointment, please call your pharmacy.

## 2020-12-01 NOTE — Progress Notes (Signed)
PCP: Curlene Labrum, MD   Primary EP:  Dr Rayann Heman  [name] is a 87 y.o. male who presents today for routine electrophysiology followup.  Since last being seen in our clinic, the patient reports doing very well.  Today, he denies symptoms of palpitations, chest pain, shortness of breath,  lower extremity edema, dizziness, presyncope, or syncope.  The patient is otherwise without complaint today.   Past Medical History:  Diagnosis Date   AAA (abdominal aortic aneurysm) (HCC)    Atrial fibrillation (HCC)    bicarbonate perfusion study 10/11 EF 69%. small defects no ischemia. 7 metastases acheived. ER visit 11/10/09 ruled out MI normal sinus rhythm orthostasis after sublingual nitroglycerin    BPH (benign prostatic hyperplasia)    Chronic back pain    GERD (gastroesophageal reflux disease)    Hypertension    Orthostatic hypotension    PONV (postoperative nausea and vomiting)    Sleep apnea    CPAP at night   Stress fracture of foot    left   Swelling of both lower extremities    Past Surgical History:  Procedure Laterality Date   CHOLECYSTECTOMY  2012   [facility] by Dr. Geroge Baseman   COLONOSCOPY N/A 08/27/2012   Procedure: COLONOSCOPY;  Surgeon: Rogene Houston, MD;  [facility];  Service: Endoscopy;  Laterality: N/A;  325   COLONOSCOPY N/A 01/12/2018   Procedure: COLONOSCOPY;  Surgeon: Rogene Houston, MD;  [facility];  Service: Endoscopy;  Laterality: N/A;  Has staff Christmas party at his house at lunch today per office   COLONOSCOPY N/A 07/21/2019   Procedure: COLONOSCOPY;  Surgeon: Rogene Houston, MD;  [facility];  Service: Endoscopy;  Laterality: N/A;   HERNIA REPAIR Bilateral    Inguinal and umbilical   INCISIONAL HERNIA REPAIR N/A 01/31/2014   Procedure: Fatima Blank HERNIORRHAPHY WITH MESH;  Surgeon: Jamesetta So, MD;  Location: [facility];  Service: General;  Laterality: N/A;   INSERTION OF MESH N/A 01/31/2014   Procedure:  INSERTION OF MESH;  Surgeon: Jamesetta So, MD;  Location: [facility];  Service: General;  Laterality: N/A;   PACEMAKER IMPLANT N/A 12/02/2017   St Jude Medical Assurity MRI conditional  dual-chamber pacemaker by Dr Rayann Heman for symptomatic sinus bradycardia   POLYPECTOMY  01/12/2018   Procedure: POLYPECTOMY;  Surgeon: Rogene Houston, MD;  [facility];  Service: Endoscopy;;  transverse colon polyp, ascending colon polyps x2, hepatic flexure polyps x2    POLYPECTOMY  07/21/2019   Procedure: POLYPECTOMY;  Surgeon: Rogene Houston, MD;  [facility];  Service: Endoscopy;;   SHOULDER SURGERY Right 2005?   for open rotator cuff repair. [place] Right 01/31/2014   Procedure: DEBRIDEMENT WOUND RIGHT LEG;  Surgeon: Jamesetta So, MD;  Location: [facility];  Service: General;  Laterality: Right;   WRIST FRACTURE SURGERY Right     ROS- all systems are reviewed and negative except as per HPI above  Current Outpatient Medications  Medication Sig Dispense Refill   alfuzosin (UROXATRAL) 10 MG 24 hr tablet Take 1 tablet (10 mg total) by mouth daily with breakfast. 90 tablet 3   CALCIUM-MAGNESIUM-ZINC PO Take 1 tablet by mouth daily.     cholecalciferol (VITAMIN D3) 25 MCG (1000 UT) tablet Take 1,000 Units by mouth daily.     dorzolamide (TRUSOPT) 2 % ophthalmic solution Place 1 drop into the  right eye 2 (two) times daily.     esomeprazole (NEXIUM) 20 MG capsule Take 20 mg by mouth daily.     finasteride (PROSCAR) 5 MG tablet Take 1 tablet (5 mg total) by mouth daily. 90 tablet 3   furosemide (LASIX) 40 MG tablet TAKE 1 TABLET BY MOUTH EVERY DAY 60 tablet 0   latanoprost (XALATAN) 0.005 % ophthalmic solution Place 1 drop into both eyes at bedtime.     metoprolol tartrate (LOPRESSOR) 25 MG tablet Take 0.5 tablets (12.5 mg total) by mouth daily. 30 tablet 6   Omega-3 Fatty Acids (OMEGA-3 2100 PO) Take 2,100 mg by mouth daily.     potassium chloride SA  (KLOR-CON) 20 MEQ tablet TAKE 1 TABLET BY MOUTH EVERY DAY 90 tablet 1   UNABLE TO FIND at bedtime. On CPAP     XARELTO 20 MG TABS tablet TAKE 1 TABLET BY MOUTH EVERY DAY WITH SUPPER. 30 tablet 6   Current Facility-Administered Medications  Medication Dose Route Frequency Provider Last Rate Last Admin   sodium chloride flush (NS) 0.9 % injection 3 mL  3 mL Intravenous Q12H Herminio Commons, MD        Physical Exam: Vitals:   12/01/20 0911  BP: 130/84  Pulse: 60  SpO2: 95%  Weight: 190 lb 0.3 oz (86.2 kg)  Height: 5\' 8"  (1.727 m)    GEN- The patient is well appearing, alert and oriented x 3 today.   Head- normocephalic, atraumatic Eyes-  Sclera clear, conjunctiva pink Ears- hearing intact Oropharynx- clear Lungs- Clear to ausculation bilaterally, normal work of breathing Chest- pacemaker pocket is well healed Heart- Regular rate and rhythm, no murmurs, rubs or gallops, PMI not laterally displaced GI- soft, NT, ND, + BS Extremities- no clubbing, cyanosis, or edema  Pacemaker interrogation- reviewed in detail today,  See PACEART report  ekg tracing ordered today is personally reviewed and shows atrial paced rhythm  Assessment and Plan:  1. Symptomatic sinus bradycardia  Normal pacemaker function See Pace Art report No changes today he is not device dependant today  2. Paroxysmal atrial fibrillation Afib burden is <1%  Chads2vasc score is is at least 3.  He is on [employer], cbc today  3. HTN Stable No change required today Obtain bmet today  Return in a year  [name], [facility] 12/01/2020 9:20 AM

## 2020-12-07 DIAGNOSIS — Z20828 Contact with and (suspected) exposure to other viral communicable diseases: Secondary | ICD-10-CM | POA: Diagnosis not present

## 2020-12-12 DIAGNOSIS — C44329 Squamous cell carcinoma of skin of other parts of face: Secondary | ICD-10-CM | POA: Diagnosis not present

## 2020-12-12 DIAGNOSIS — Z85828 Personal history of other malignant neoplasm of skin: Secondary | ICD-10-CM | POA: Diagnosis not present

## 2020-12-12 DIAGNOSIS — D485 Neoplasm of uncertain behavior of skin: Secondary | ICD-10-CM | POA: Diagnosis not present

## 2020-12-12 DIAGNOSIS — C44319 Basal cell carcinoma of skin of other parts of face: Secondary | ICD-10-CM | POA: Diagnosis not present

## 2020-12-12 DIAGNOSIS — D0439 Carcinoma in situ of skin of other parts of face: Secondary | ICD-10-CM | POA: Diagnosis not present

## 2020-12-12 DIAGNOSIS — L570 Actinic keratosis: Secondary | ICD-10-CM | POA: Diagnosis not present

## 2020-12-12 DIAGNOSIS — Z08 Encounter for follow-up examination after completed treatment for malignant neoplasm: Secondary | ICD-10-CM | POA: Diagnosis not present

## 2020-12-27 ENCOUNTER — Ambulatory Visit (INDEPENDENT_AMBULATORY_CARE_PROVIDER_SITE_OTHER): Payer: Medicare Other

## 2020-12-27 DIAGNOSIS — I495 Sick sinus syndrome: Secondary | ICD-10-CM | POA: Diagnosis not present

## 2020-12-27 LAB — CUP PACEART REMOTE DEVICE CHECK
Battery Remaining Longevity: 92 mo
Battery Remaining Percentage: 74 %
Battery Voltage: 3.01 V
Brady Statistic AP VP Percent: 1 %
Brady Statistic AP VS Percent: 94 %
Brady Statistic AS VP Percent: 1 %
Brady Statistic AS VS Percent: 6 %
Brady Statistic RA Percent Paced: 93 %
Brady Statistic RV Percent Paced: 1 %
Date Time Interrogation Session: 20221130020031
Implantable Lead Implant Date: 20191105
Implantable Lead Implant Date: 20191105
Implantable Lead Location: 753859
Implantable Lead Location: 753860
Implantable Pulse Generator Implant Date: 20191105
Lead Channel Impedance Value: 410 Ohm
Lead Channel Impedance Value: 430 Ohm
Lead Channel Pacing Threshold Amplitude: 0.25 V
Lead Channel Pacing Threshold Amplitude: 0.875 V
Lead Channel Pacing Threshold Pulse Width: 0.5 ms
Lead Channel Pacing Threshold Pulse Width: 0.5 ms
Lead Channel Sensing Intrinsic Amplitude: 2.9 mV
Lead Channel Sensing Intrinsic Amplitude: 9.3 mV
Lead Channel Setting Pacing Amplitude: 1.125
Lead Channel Setting Pacing Amplitude: 1.25 V
Lead Channel Setting Pacing Pulse Width: 0.5 ms
Lead Channel Setting Sensing Sensitivity: 2 mV
Pulse Gen Model: 2272
Pulse Gen Serial Number: 9079121

## 2021-01-02 ENCOUNTER — Other Ambulatory Visit: Payer: Self-pay | Admitting: Cardiology

## 2021-01-02 DIAGNOSIS — C44329 Squamous cell carcinoma of skin of other parts of face: Secondary | ICD-10-CM | POA: Diagnosis not present

## 2021-01-05 NOTE — Progress Notes (Signed)
Remote pacemaker transmission.   

## 2021-01-06 DIAGNOSIS — Z20828 Contact with and (suspected) exposure to other viral communicable diseases: Secondary | ICD-10-CM | POA: Diagnosis not present

## 2021-01-09 DIAGNOSIS — C44329 Squamous cell carcinoma of skin of other parts of face: Secondary | ICD-10-CM | POA: Diagnosis not present

## 2021-01-09 DIAGNOSIS — D485 Neoplasm of uncertain behavior of skin: Secondary | ICD-10-CM | POA: Diagnosis not present

## 2021-01-11 DIAGNOSIS — L110 Acquired keratosis follicularis: Secondary | ICD-10-CM | POA: Diagnosis not present

## 2021-01-11 DIAGNOSIS — M79672 Pain in left foot: Secondary | ICD-10-CM | POA: Diagnosis not present

## 2021-01-11 DIAGNOSIS — I739 Peripheral vascular disease, unspecified: Secondary | ICD-10-CM | POA: Diagnosis not present

## 2021-01-11 DIAGNOSIS — M79671 Pain in right foot: Secondary | ICD-10-CM | POA: Diagnosis not present

## 2021-01-11 IMAGING — CT CT CTA ABD/PEL W/CM AND/OR W/O CM
1 of 4 series · 12 of 32 positions shown, 17 images · IV contrast (APPLIED)
Comparison: 11/17/2014

CLINICAL DATA: Abdominal aortic aneurysm, surveillance.

EXAM:
CTA ABDOMEN AND PELVIS WITH CONTRAST
TECHNIQUE: Multidetector CT imaging of the abdomen and pelvis was performed
using the standard protocol during bolus administration of
intravenous contrast. Multiplanar reconstructed images and MIPs were
obtained and reviewed to evaluate the vascular anatomy.
CONTRAST:  75mL EIB2NM-ZHB IOPAMIDOL (EIB2NM-ZHB) INJECTION 76%

[Series 6: pre stent angio · axial · non-contrast · 0.84mm/px · z∈[-335,+41]mm · 12 of 424 slices shown, 17 images]
[im 24/424  soft-tissue]
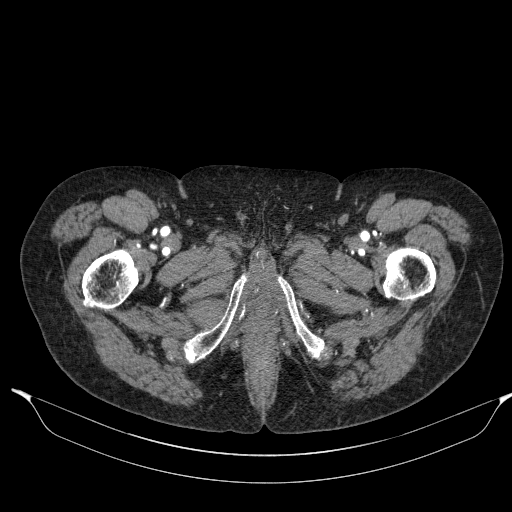
[im 24/424  bone]
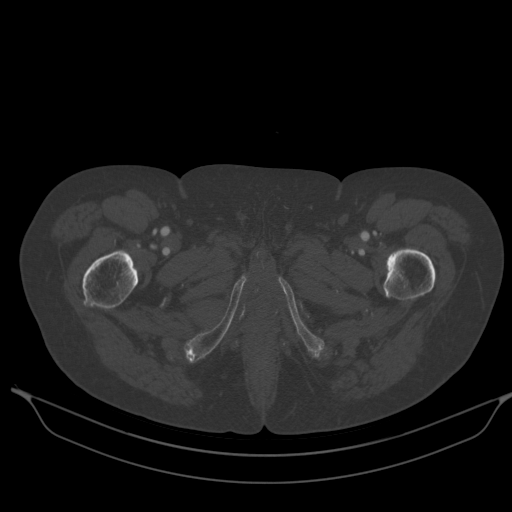
[im 71/424  soft-tissue]
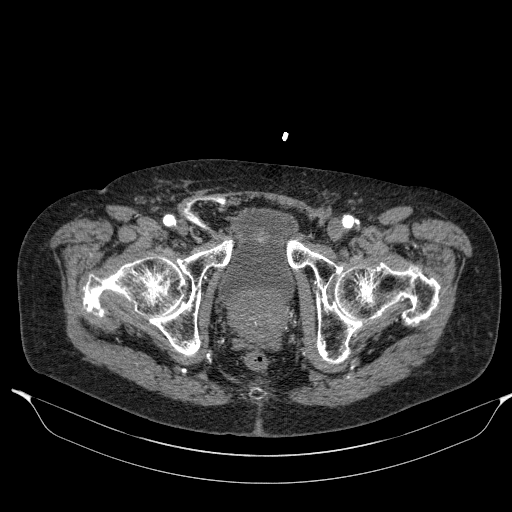
[im 95/424  soft-tissue]
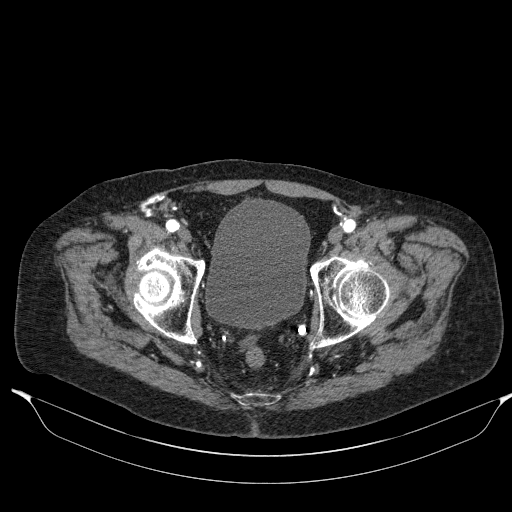
[im 142/424  soft-tissue]
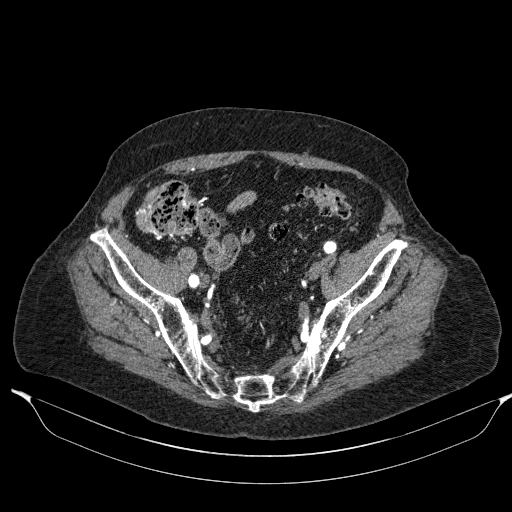
[im 165/424  soft-tissue]
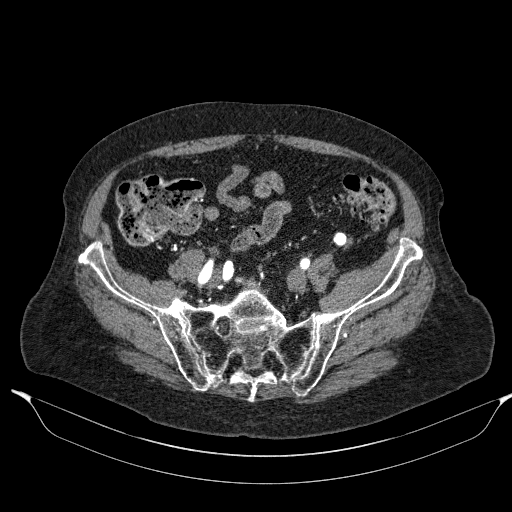
[im 212/424  soft-tissue]
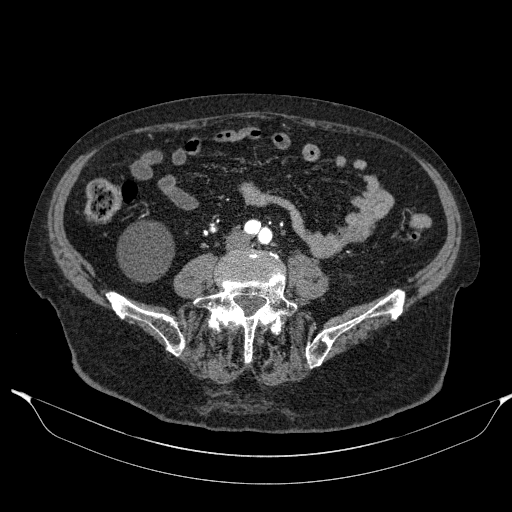
[im 259/424  soft-tissue]
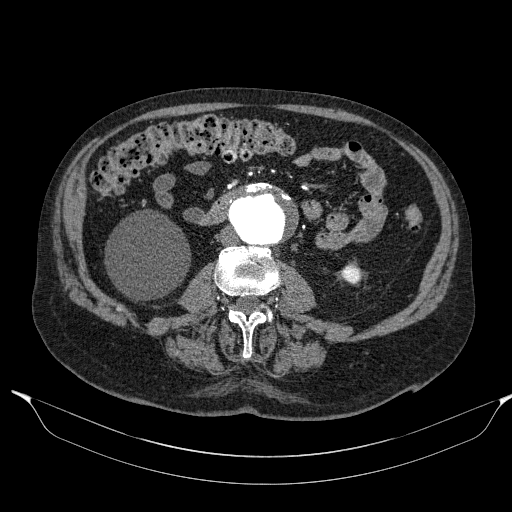
[im 283/424  soft-tissue]
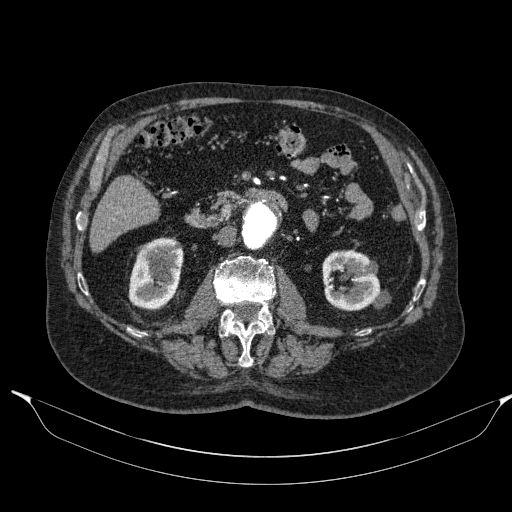
[im 330/424  soft-tissue]
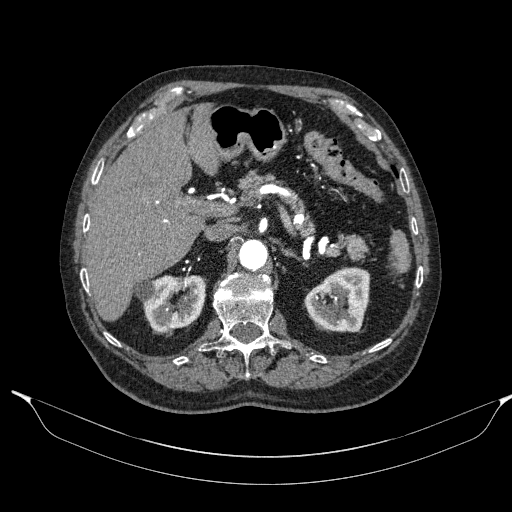
[im 330/424  lung]
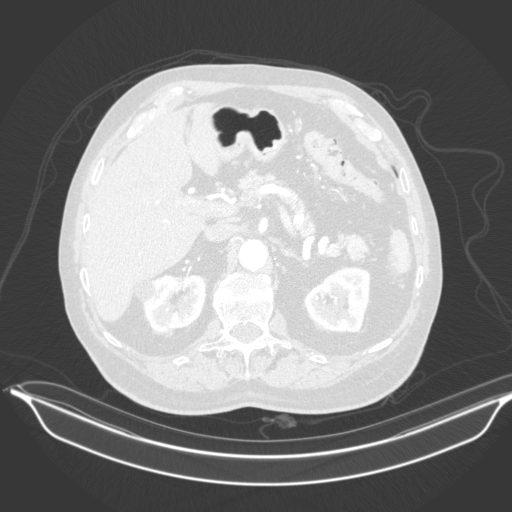
[im 330/424  bone]
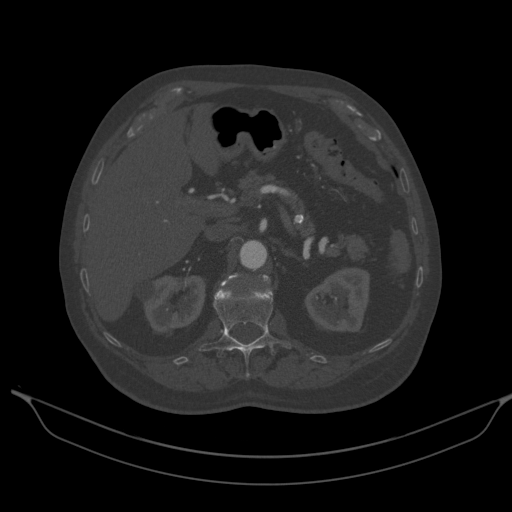
[im 353/424  soft-tissue]
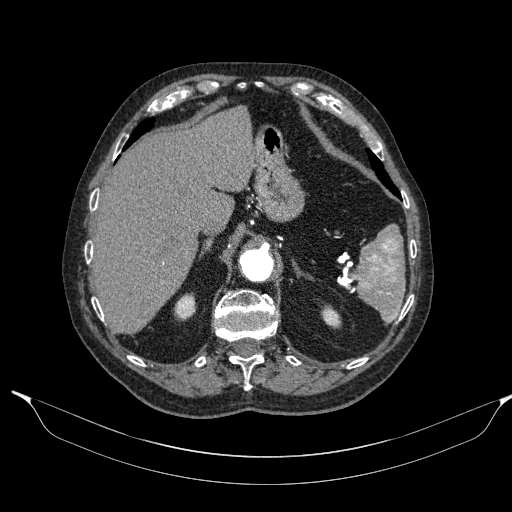
[im 353/424  lung]
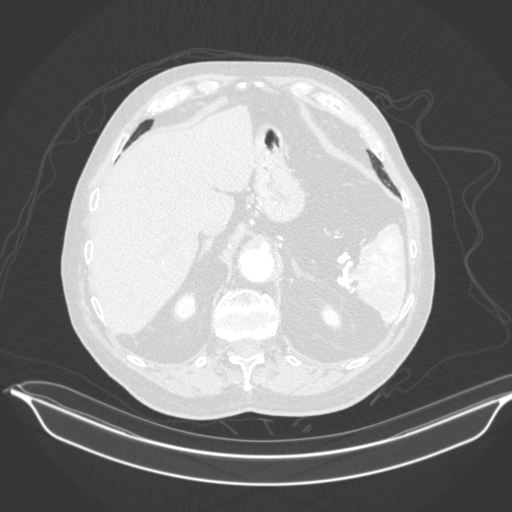
[im 377/424  lung]
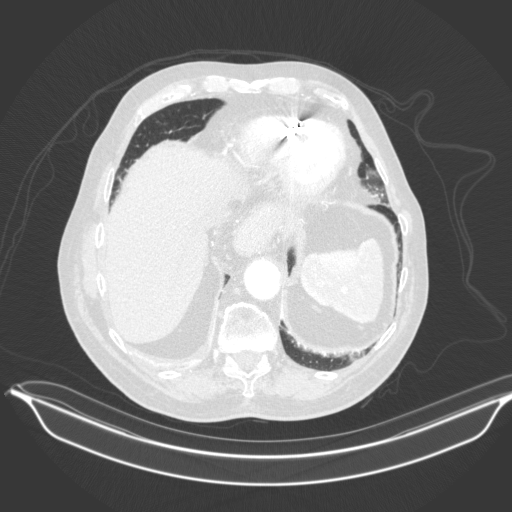
[im 400/424  soft-tissue]
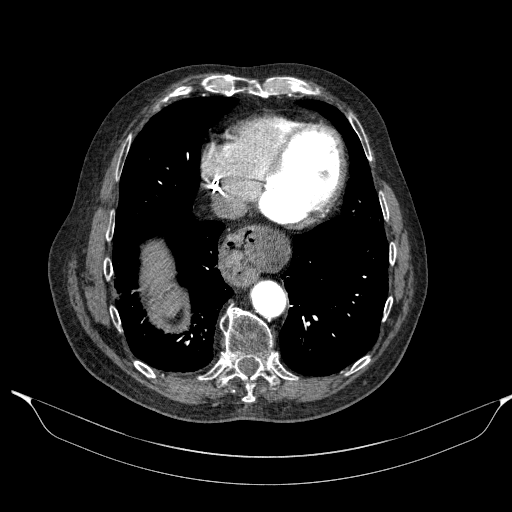
[im 400/424  lung]
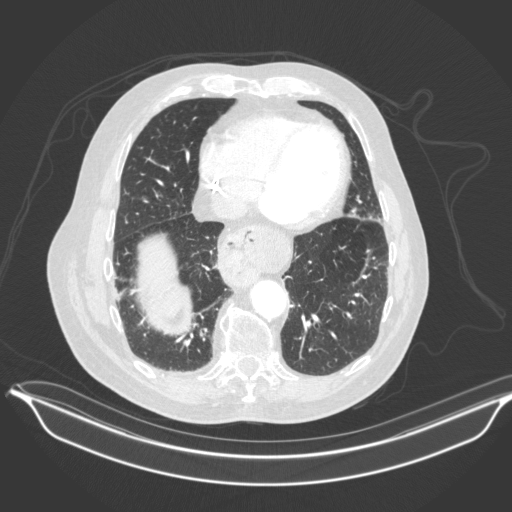

[12 of 32 positions shown; findings below may reference images not displayed]

FINDINGS: VASCULAR

Aorta: Moderate calcified atheromatous plaque. Fusiform infrarenal
aneurysm 6 x 5.3 cm maximum transverse dimensions (previously 4.8 x
4.2) tapering to 2.2 cm at the bifurcation. Eccentric nonocclusive
mural thrombus in the aneurysmal segment. No dissection or stenosis.

Celiac: Calcified ostial plaque with minimal stenosis. Mild
narrowing at the level of the median arcuate ligament of the
diaphragm. Patent distally.

SMA: Patent without evidence of aneurysm, dissection, vasculitis or
significant stenosis.

Renals: Single left with calcified ostial plaque, no stenosis.

Single right, with calcified ostial plaque, no high-grade stenosis.
Mild beading distally near the bifurcation suggesting FMD.

IMA: Short-segment proximal occlusion, reconstituted distally by
visceral collaterals.

Inflow: On the right, mild tortuosity and scattered eccentric
calcified plaque. Common iliac 1.5 cm maximum diameter. No stenosis
or dissection.

On the left, tortuous common iliac measuring up to 1.8 cm diameter.
Scattered calcified plaque .

Proximal Outflow: Mildly atheromatous, patent

Veins: Patent hepatic veins, portal vein, SMV, splenic vein,
bilateral renal veins, IVC. No venous pathology identified.

Review of the MIP images confirms the above findings.

NON-VASCULAR

Lower chest: Progressive coarse subpleural interstitial markings in
the lung bases. Interval placement of transvenous pacing leads to
the right atrium and towards the right ventricular apex. Moderate
hiatal hernia. No pleural or pericardial effusion.

Hepatobiliary: No focal liver abnormality is seen. Status post
cholecystectomy. No biliary dilatation.

Pancreas: Unremarkable. No pancreatic ductal dilatation or
surrounding inflammatory changes.

Spleen: Normal in size without focal abnormality.

Adrenals/Urinary Tract: Adrenal glands negative. 8.6 cm
simple-appearing cystic lesion from the lower pole right kidney,
previously 4.1. 3.3 cm left renal parapelvic cyst, previously 1.9.
Additional smaller cystic renal lesions bilaterally. No
hydronephrosis. Urinary bladder physiologically distended.

Stomach/Bowel: Moderate hiatal hernia. The stomach is nondilated.
Small bowel decompressed. The colon is nondilated. Scattered colonic
diverticula most numerous in the distal descending and sigmoid
segments without significant adjacent inflammatory change.

Lymphatic: No abdominal or pelvic adenopathy.

Reproductive: Prostate enlargement with central coarse
calcifications.

Other: Bilateral pelvic phleboliths.  No ascites.  No free air.

Musculoskeletal: Mild lumbar spondylitic change most marked L5-S1.
bilateral hip DJD. No fracture or worrisome bone lesion.
IMPRESSION: 1. 6 cm infrarenal abdominal aortic aneurysm (previously 4.8)
without complicating features. (The patient is currently under the
care of vascular surgeon, as recommended by ACR consensus
guidelines: White Paper of the ACR Incidental Findings Committee II
on Vascular Findings. [HOSPITAL] 8232; [DATE].)
2. Possible FMD involving distal right renal artery.
3. Moderate hiatal hernia.
4. Descending and sigmoid diverticulosis.

Aortic Atherosclerosis (UADA2-U5P.P). Aortic aneurysm NOS
(UADA2-4QE.B).

## 2021-01-12 DIAGNOSIS — D485 Neoplasm of uncertain behavior of skin: Secondary | ICD-10-CM | POA: Diagnosis not present

## 2021-01-25 ENCOUNTER — Other Ambulatory Visit: Payer: Self-pay | Admitting: Internal Medicine

## 2021-01-25 ENCOUNTER — Other Ambulatory Visit: Payer: Self-pay | Admitting: Cardiology

## 2021-01-25 DIAGNOSIS — I480 Paroxysmal atrial fibrillation: Secondary | ICD-10-CM

## 2021-01-25 NOTE — Telephone Encounter (Signed)
Prescription refill request for Xarelto received.  Indication: Afib  Last office visit: 12/01/20 ([facility])  Weight: 86.2kg Age: 87 Scr: 1.09 (11/05/19)  CrCl: 52.46ml/min  Pt labs overdue. Labs ordered to be completed in [place]. Called and spoke with pt. He stated he would go have labs done tomorrow or Monday. Also stated he had enough Xarelto until labs are resulted.

## 2021-01-31 ENCOUNTER — Other Ambulatory Visit: Payer: Self-pay | Admitting: Cardiology

## 2021-01-31 NOTE — Telephone Encounter (Signed)
Called pt and reminded him that labs have been ordered and need to be drawn before his Xarelto can be refilled. Pt stated he would try to go this week.

## 2021-02-01 DIAGNOSIS — D0439 Carcinoma in situ of skin of other parts of face: Secondary | ICD-10-CM | POA: Diagnosis not present

## 2021-02-05 DIAGNOSIS — D6869 Other thrombophilia: Secondary | ICD-10-CM | POA: Diagnosis not present

## 2021-02-05 DIAGNOSIS — I10 Essential (primary) hypertension: Secondary | ICD-10-CM | POA: Diagnosis not present

## 2021-02-07 DIAGNOSIS — Z20828 Contact with and (suspected) exposure to other viral communicable diseases: Secondary | ICD-10-CM | POA: Diagnosis not present

## 2021-02-07 NOTE — Telephone Encounter (Signed)
Xarelto 20mg  refill request received. Pt is 88 years old, weight-86.2kg, Crea-1.12 on 02/05/2021 via [facility], last seen by Dr. Rayann Heman on 12/01/2020, Diagnosis-Afib, CrCl-51.74ml/min; Dose is appropriate based on dosing criteria. Will send in refill to requested pharmacy.

## 2021-02-27 ENCOUNTER — Other Ambulatory Visit: Payer: Self-pay | Admitting: Cardiology

## 2021-03-05 DIAGNOSIS — H43813 Vitreous degeneration, bilateral: Secondary | ICD-10-CM | POA: Diagnosis not present

## 2021-03-05 DIAGNOSIS — H524 Presbyopia: Secondary | ICD-10-CM | POA: Diagnosis not present

## 2021-03-05 DIAGNOSIS — H401113 Primary open-angle glaucoma, right eye, severe stage: Secondary | ICD-10-CM | POA: Diagnosis not present

## 2021-03-05 DIAGNOSIS — H401122 Primary open-angle glaucoma, left eye, moderate stage: Secondary | ICD-10-CM | POA: Diagnosis not present

## 2021-03-07 DIAGNOSIS — D485 Neoplasm of uncertain behavior of skin: Secondary | ICD-10-CM | POA: Diagnosis not present

## 2021-03-07 DIAGNOSIS — D0439 Carcinoma in situ of skin of other parts of face: Secondary | ICD-10-CM | POA: Diagnosis not present

## 2021-03-07 DIAGNOSIS — C44319 Basal cell carcinoma of skin of other parts of face: Secondary | ICD-10-CM | POA: Diagnosis not present

## 2021-03-22 DIAGNOSIS — M79671 Pain in right foot: Secondary | ICD-10-CM | POA: Diagnosis not present

## 2021-03-22 DIAGNOSIS — L110 Acquired keratosis follicularis: Secondary | ICD-10-CM | POA: Diagnosis not present

## 2021-03-22 DIAGNOSIS — I739 Peripheral vascular disease, unspecified: Secondary | ICD-10-CM | POA: Diagnosis not present

## 2021-03-22 DIAGNOSIS — M79672 Pain in left foot: Secondary | ICD-10-CM | POA: Diagnosis not present

## 2021-03-28 ENCOUNTER — Ambulatory Visit (INDEPENDENT_AMBULATORY_CARE_PROVIDER_SITE_OTHER): Payer: Medicare Other

## 2021-03-28 DIAGNOSIS — I495 Sick sinus syndrome: Secondary | ICD-10-CM

## 2021-03-28 LAB — CUP PACEART REMOTE DEVICE CHECK
Battery Remaining Longevity: 89 mo
Battery Remaining Percentage: 72 %
Battery Voltage: 3.01 V
Brady Statistic AP VP Percent: 1 %
Brady Statistic AP VS Percent: 92 %
Brady Statistic AS VP Percent: 1 %
Brady Statistic AS VS Percent: 7.7 %
Brady Statistic RA Percent Paced: 91 %
Brady Statistic RV Percent Paced: 1 %
Date Time Interrogation Session: 20230301020014
Implantable Lead Implant Date: 20191105
Implantable Lead Implant Date: 20191105
Implantable Lead Location: 753859
Implantable Lead Location: 753860
Implantable Pulse Generator Implant Date: 20191105
Lead Channel Impedance Value: 410 Ohm
Lead Channel Impedance Value: 410 Ohm
Lead Channel Pacing Threshold Amplitude: 0.25 V
Lead Channel Pacing Threshold Amplitude: 0.875 V
Lead Channel Pacing Threshold Pulse Width: 0.5 ms
Lead Channel Pacing Threshold Pulse Width: 0.5 ms
Lead Channel Sensing Intrinsic Amplitude: 3.2 mV
Lead Channel Sensing Intrinsic Amplitude: 7.5 mV
Lead Channel Setting Pacing Amplitude: 1.125
Lead Channel Setting Pacing Amplitude: 1.25 V
Lead Channel Setting Pacing Pulse Width: 0.5 ms
Lead Channel Setting Sensing Sensitivity: 2 mV
Pulse Gen Model: 2272
Pulse Gen Serial Number: 9079121

## 2021-04-06 NOTE — Progress Notes (Signed)
Remote pacemaker transmission.   

## 2021-05-27 ENCOUNTER — Other Ambulatory Visit: Payer: Self-pay | Admitting: Cardiology

## 2021-06-20 ENCOUNTER — Other Ambulatory Visit: Payer: Self-pay

## 2021-06-20 DIAGNOSIS — Z08 Encounter for follow-up examination after completed treatment for malignant neoplasm: Secondary | ICD-10-CM | POA: Diagnosis not present

## 2021-06-20 DIAGNOSIS — D0439 Carcinoma in situ of skin of other parts of face: Secondary | ICD-10-CM | POA: Diagnosis not present

## 2021-06-20 DIAGNOSIS — I714 Abdominal aortic aneurysm, without rupture, unspecified: Secondary | ICD-10-CM

## 2021-06-20 DIAGNOSIS — Z86007 Personal history of in-situ neoplasm of skin: Secondary | ICD-10-CM | POA: Diagnosis not present

## 2021-06-20 DIAGNOSIS — C44319 Basal cell carcinoma of skin of other parts of face: Secondary | ICD-10-CM | POA: Diagnosis not present

## 2021-06-26 ENCOUNTER — Other Ambulatory Visit: Payer: Self-pay | Admitting: Urology

## 2021-06-26 DIAGNOSIS — N401 Enlarged prostate with lower urinary tract symptoms: Secondary | ICD-10-CM

## 2021-06-27 ENCOUNTER — Ambulatory Visit (INDEPENDENT_AMBULATORY_CARE_PROVIDER_SITE_OTHER): Payer: Medicare Other

## 2021-06-27 DIAGNOSIS — I495 Sick sinus syndrome: Secondary | ICD-10-CM | POA: Diagnosis not present

## 2021-06-28 ENCOUNTER — Ambulatory Visit (HOSPITAL_COMMUNITY)
Admission: RE | Admit: 2021-06-28 | Discharge: 2021-06-28 | Disposition: A | Discharge status: Home / Self Care | Payer: Medicare Other | Source: Ambulatory Visit | Attending: Vascular Surgery | Admitting: Vascular Surgery

## 2021-06-28 ENCOUNTER — Ambulatory Visit (INDEPENDENT_AMBULATORY_CARE_PROVIDER_SITE_OTHER): Payer: Medicare Other | Admitting: Vascular Surgery

## 2021-06-28 ENCOUNTER — Encounter: Payer: Self-pay | Admitting: Vascular Surgery

## 2021-06-28 VITALS — BP 114/75 | HR 61 | Temp 98.0°F | Resp 20 | Ht 68.0 in | Wt 181.0 lb

## 2021-06-28 DIAGNOSIS — I7143 Infrarenal abdominal aortic aneurysm, without rupture: Secondary | ICD-10-CM

## 2021-06-28 DIAGNOSIS — I714 Abdominal aortic aneurysm, without rupture, unspecified: Secondary | ICD-10-CM | POA: Insufficient documentation

## 2021-06-28 LAB — CUP PACEART REMOTE DEVICE CHECK
Battery Remaining Longevity: 86 mo
Battery Remaining Percentage: 69 %
Battery Voltage: 3.01 V
Brady Statistic AP VP Percent: 1 %
Brady Statistic AP VS Percent: 92 %
Brady Statistic AS VP Percent: 1 %
Brady Statistic AS VS Percent: 7.9 %
Brady Statistic RA Percent Paced: 91 %
Brady Statistic RV Percent Paced: 1 %
Date Time Interrogation Session: 20230531020014
Implantable Lead Implant Date: 20191105
Implantable Lead Implant Date: 20191105
Implantable Lead Location: 753859
Implantable Lead Location: 753860
Implantable Pulse Generator Implant Date: 20191105
Lead Channel Impedance Value: 400 Ohm
Lead Channel Impedance Value: 410 Ohm
Lead Channel Pacing Threshold Amplitude: 0.25 V
Lead Channel Pacing Threshold Amplitude: 0.875 V
Lead Channel Pacing Threshold Pulse Width: 0.5 ms
Lead Channel Pacing Threshold Pulse Width: 0.5 ms
Lead Channel Sensing Intrinsic Amplitude: 3.2 mV
Lead Channel Sensing Intrinsic Amplitude: 7.2 mV
Lead Channel Setting Pacing Amplitude: 1.125
Lead Channel Setting Pacing Amplitude: 1.25 V
Lead Channel Setting Pacing Pulse Width: 0.5 ms
Lead Channel Setting Sensing Sensitivity: 2 mV
Pulse Gen Model: 2272
Pulse Gen Serial Number: 9079121

## 2021-06-28 NOTE — Progress Notes (Signed)
REASON FOR VISIT:   Follow-up of abdominal aortic aneurysm  MEDICAL ISSUES:   ABDOMINAL AORTIC ANEURYSM: The patient's aneurysm has increased to 6.7 cm.  I explained to the patient that the risk of rupture for an aneurysm of this size is 15 to 20 %/year.  I reviewed his CT scan from 2021 and at that time he appeared to be a candidate for endovascular approach.  I again discussed the option of proceeding with a CT scan and work-up so that we could consider an endovascular repair of this aneurysm which is enlarging.  He feels very strongly about not having any surgery and understands the risks involved.  I have again discussed with him the need to consider whether or not he would want to proceed urgently if the aneurysm rupture.  We have had this discussion multiple times.  He is not willing to commit 1 way or the other at this point again which is very reasonable.  I offered getting a follow-up scan sooner rather than later given that if the aneurysm continues to enlarge we may need to push harder to convince him to proceed with elective repair.  However again he feels very strongly about not having surgery.  Fortunately he is not a smoker.  His blood pressure is under good control.  HPI:   [name] is a pleasant 88 y.o. male following with an abdominal aortic aneurysm.  I last saw him on 06/07/2020.  At that time the aneurysm measured 5.9 cm in maximum diameter.  Based on previous review his of his CT scan I felt that he would be a candidate for endovascular repair potentially.  I explained that his risk of rupture was 10 to 15 %/year.  After extensive discussion he felt strongly about not having surgery given his age and this is certainly reasonable.  He did want to have the aneurysm checked again in a year.  Since I saw him last, he does have some occasional aches and pains in his back but no sudden onset abdominal pain or back pain.  Is been no changes in his medical history.  He denies  any previous history of myocardial infarction or history of congestive heart failure.  He denies chest pain.  He is able to walk up a flight of stairs without significant shortness of breath.  I do not get any history of claudication or rest pain.  Past Medical History:  Diagnosis Date   AAA (abdominal aortic aneurysm) (HCC)    Atrial fibrillation (HCC)    bicarbonate perfusion study 10/11 EF 69%. small defects no ischemia. 7 metastases acheived. ER visit 11/10/09 ruled out MI normal sinus rhythm orthostasis after sublingual nitroglycerin    BPH (benign prostatic hyperplasia)    Chronic back pain    GERD (gastroesophageal reflux disease)    Hypertension    Orthostatic hypotension    PONV (postoperative nausea and vomiting)    Sleep apnea    CPAP at night   Stress fracture of foot    left   Swelling of both lower extremities     Family History  Problem Relation Age of Onset   Heart failure Mother    Heart failure Father    Cancer Sister     SOCIAL HISTORY: Social History   Tobacco Use   Smoking status: Former    Packs/day: 1.00    Years: 10.00    Pack years: 10.00    Types: Cigarettes    Start date: 10/15/1943  Quit date: 01/28/1954    Years since quitting: 67.4   Smokeless tobacco: Never   Tobacco comments:    tobacco use - no  Substance Use Topics   Alcohol use: No    Alcohol/week: 0.0 standard drinks    Allergies  Allergen Reactions   Flexeril [Cyclobenzaprine Hcl] Nausea And Vomiting   Percocet [Oxycodone-Acetaminophen] Nausea And Vomiting   Vicodin [Hydrocodone-Acetaminophen] Nausea And Vomiting   Sulfonamide Derivatives Rash    Current Outpatient Medications  Medication Sig Dispense Refill   alfuzosin (UROXATRAL) 10 MG 24 hr tablet Take 1 tablet (10 mg total) by mouth daily with breakfast. 90 tablet 3   CALCIUM-MAGNESIUM-ZINC PO Take 1 tablet by mouth daily.     cholecalciferol (VITAMIN D3) 25 MCG (1000 UT) tablet Take 1,000 Units by mouth daily.      dorzolamide (TRUSOPT) 2 % ophthalmic solution Place 1 drop into the right eye 2 (two) times daily.     esomeprazole (NEXIUM) 20 MG capsule Take 20 mg by mouth daily.     finasteride (PROSCAR) 5 MG tablet Take 1 tablet (5 mg total) by mouth daily. 90 tablet 3   furosemide (LASIX) 40 MG tablet TAKE 1 TABLET BY MOUTH EVERY DAY 90 tablet 0   latanoprost (XALATAN) 0.005 % ophthalmic solution Place 1 drop into both eyes at bedtime.     metoprolol tartrate (LOPRESSOR) 25 MG tablet TAKE 1/2 TABLET BY MOUTH ONCE DAILY 30 tablet 6   Omega-3 Fatty Acids (OMEGA-3 2100 PO) Take 2,100 mg by mouth daily.     potassium chloride SA (KLOR-CON M) 20 MEQ tablet TAKE 1 TABLET BY MOUTH EVERY DAY 90 tablet 1   rivaroxaban (XARELTO) 20 MG TABS tablet TAKE 1 TABLET BY MOUTH EVERY DAY WITH SUPPER. 30 tablet 6   UNABLE TO FIND at bedtime. On CPAP     Current Facility-Administered Medications  Medication Dose Route Frequency Provider Last Rate Last Admin   sodium chloride flush (NS) 0.9 % injection 3 mL  3 mL Intravenous Q12H Herminio Commons, MD        REVIEW OF SYSTEMS:  '[X]'$  denotes positive finding, '[ ]'$  denotes negative finding Cardiac  Comments:  Chest pain or chest pressure:    Shortness of breath upon exertion:    Short of breath when lying flat:    Irregular heart rhythm:        Vascular    Pain in calf, thigh, or hip brought on by ambulation:    Pain in feet at night that wakes you up from your sleep:     Blood clot in your veins:    Leg swelling:         Pulmonary    Oxygen at home:    Productive cough:     Wheezing:         Neurologic    Sudden weakness in arms or legs:     Sudden numbness in arms or legs:     Sudden onset of difficulty speaking or slurred speech:    Temporary loss of vision in one eye:     Problems with dizziness:         Gastrointestinal    Blood in stool:     Vomited blood:         Genitourinary    Burning when urinating:     Blood in urine:        Psychiatric     Major depression:  Hematologic    Bleeding problems:    Problems with blood clotting too easily:        Skin    Rashes or ulcers:        Constitutional    Fever or chills:     PHYSICAL EXAM:   Vitals:   06/28/21 1015  BP: 114/75  Pulse: 61  Resp: 20  Temp: 98 F (36.7 C)  SpO2: 93%  Weight: 181 lb (82.1 kg)  Height: '5\' 8"'$  (1.727 m)    GENERAL: The patient is a well-nourished male, in no acute distress. The vital signs are documented above. CARDIAC: There is a regular rate and rhythm.  VASCULAR: I do not detect carotid bruits. He has palpable femoral pulses. I cannot palpate pedal pulses however both feet are warm and well-perfused. PULMONARY: There is good air exchange bilaterally without wheezing or rales. ABDOMEN: Soft and non-tender with normal pitched bowel sounds.  I do not palpate an aneurysm MUSCULOSKELETAL: There are no major deformities or cyanosis. NEUROLOGIC: No focal weakness or paresthesias are detected. SKIN: There are no ulcers or rashes noted. PSYCHIATRIC: The patient has a normal affect.  DATA:    DUPLEX ABDOMINAL AORTA: I have independently interpreted his duplex of the abdominal aorta today.  The maximum diameter is 6.7 cm which has increased from 5.9 a year ago.  The right common iliac artery measures 1.2 cm in maximum diameter.  The left common iliac artery measures 1.5 cm in maximum diameter.  CT ABDOMEN PELVIS: His most recent CT of the abdomen and pelvis was in November 2021.  Based on his anatomy at that time he looks like he was a candidate for endovascular repair.  LABS: The most recent labs I have were from June of 2022.  GFR was greater than 60.    Deitra Mayo [facility] [phone]

## 2021-07-02 DIAGNOSIS — M79671 Pain in right foot: Secondary | ICD-10-CM | POA: Diagnosis not present

## 2021-07-02 DIAGNOSIS — M79672 Pain in left foot: Secondary | ICD-10-CM | POA: Diagnosis not present

## 2021-07-02 DIAGNOSIS — I739 Peripheral vascular disease, unspecified: Secondary | ICD-10-CM | POA: Diagnosis not present

## 2021-07-02 DIAGNOSIS — L110 Acquired keratosis follicularis: Secondary | ICD-10-CM | POA: Diagnosis not present

## 2021-07-04 ENCOUNTER — Other Ambulatory Visit: Payer: Self-pay | Admitting: Urology

## 2021-07-04 DIAGNOSIS — N401 Enlarged prostate with lower urinary tract symptoms: Secondary | ICD-10-CM

## 2021-07-05 ENCOUNTER — Ambulatory Visit: Payer: Medicare Other | Admitting: Urology

## 2021-07-10 NOTE — Progress Notes (Signed)
Remote pacemaker transmission.   

## 2021-07-12 ENCOUNTER — Ambulatory Visit (INDEPENDENT_AMBULATORY_CARE_PROVIDER_SITE_OTHER): Payer: Medicare Other | Admitting: Urology

## 2021-07-12 ENCOUNTER — Encounter: Payer: Self-pay | Admitting: Urology

## 2021-07-12 VITALS — BP 103/66 | HR 79

## 2021-07-12 DIAGNOSIS — R351 Nocturia: Secondary | ICD-10-CM | POA: Diagnosis not present

## 2021-07-12 DIAGNOSIS — N401 Enlarged prostate with lower urinary tract symptoms: Secondary | ICD-10-CM

## 2021-07-12 MED ORDER — FINASTERIDE 5 MG PO TABS: 5.0000 mg | ORAL_TABLET | Freq: Every day | ORAL | 3 refills | Status: DC

## 2021-07-12 NOTE — Progress Notes (Signed)
Subjective:  1. Benign localized prostatic hyperplasia with lower urinary tract symptoms (LUTS)   2. Nocturia     [name] returns today in f/u for his history of BPH with BOO.  He is on finasteride and alfuzosin. His UA is clear and his IPSS is 10 with nocturia x 2.  He has had no associated signs or syptoms.   He has some chronic right low back pain.    IPSS     Row Name 07/12/21 1500         International Prostate Symptom Score   How often have you had the sensation of not emptying your bladder? Less than half the time     How often have you had to urinate less than every two hours? About half the time     How often have you found you stopped and started again several times when you urinated? Less than 1 in 5 times     How often have you found it difficult to postpone urination? About half the time     How often have you had a weak urinary stream? Not at All     How often have you had to strain to start urination? Not at All     How many times did you typically get up at night to urinate? 1 Time     Total IPSS Score 10       Quality of Life due to urinary symptoms   If you were to spend the rest of your life with your urinary condition just the way it is now how would you feel about that? Mostly Satisfied               ROS:  ROS:  A complete review of systems was performed.  All systems are negative except for pertinent findings as noted.   Review of Systems  Musculoskeletal:  Positive for back pain.  All other systems reviewed and are negative.   Allergies  Allergen Reactions   Flexeril [Cyclobenzaprine Hcl] Nausea And Vomiting   Percocet [Oxycodone-Acetaminophen] Nausea And Vomiting   Vicodin [Hydrocodone-Acetaminophen] Nausea And Vomiting   Sulfonamide Derivatives Rash    Outpatient Encounter Medications as of 07/12/2021  Medication Sig   alfuzosin (UROXATRAL) 10 MG 24 hr tablet TAKE 1 TABLET BY MOUTH DAILY WITH BREAKFAST   CALCIUM-MAGNESIUM-ZINC PO  Take 1 tablet by mouth daily.   cholecalciferol (VITAMIN D3) 25 MCG (1000 UT) tablet Take 1,000 Units by mouth daily.   dorzolamide (TRUSOPT) 2 % ophthalmic solution Place 1 drop into the right eye 2 (two) times daily.   esomeprazole (NEXIUM) 20 MG capsule Take 20 mg by mouth daily.   finasteride (PROSCAR) 5 MG tablet Take 1 tablet (5 mg total) by mouth daily.   furosemide (LASIX) 40 MG tablet TAKE 1 TABLET BY MOUTH EVERY DAY   latanoprost (XALATAN) 0.005 % ophthalmic solution Place 1 drop into both eyes at bedtime.   metoprolol tartrate (LOPRESSOR) 25 MG tablet TAKE 1/2 TABLET BY MOUTH ONCE DAILY   Omega-3 Fatty Acids (OMEGA-3 2100 PO) Take 2,100 mg by mouth daily.   potassium chloride SA (KLOR-CON M) 20 MEQ tablet TAKE 1 TABLET BY MOUTH EVERY DAY   rivaroxaban (XARELTO) 20 MG TABS tablet TAKE 1 TABLET BY MOUTH EVERY DAY WITH SUPPER.   UNABLE TO FIND at bedtime. On CPAP   [DISCONTINUED] finasteride (PROSCAR) 5 MG tablet Take 1 tablet (5 mg total) by mouth daily.   Facility-Administered Encounter Medications as of  07/12/2021  Medication   sodium chloride flush (NS) 0.9 % injection 3 mL    Past Medical History:  Diagnosis Date   AAA (abdominal aortic aneurysm) (HCC)    Atrial fibrillation (HCC)    bicarbonate perfusion study 10/11 EF 69%. small defects no ischemia. 7 metastases acheived. ER visit 11/10/09 ruled out MI normal sinus rhythm orthostasis after sublingual nitroglycerin    BPH (benign prostatic hyperplasia)    Chronic back pain    GERD (gastroesophageal reflux disease)    Hypertension    Orthostatic hypotension    PONV (postoperative nausea and vomiting)    Sleep apnea    CPAP at night   Stress fracture of foot    left   Swelling of both lower extremities     Past Surgical History:  Procedure Laterality Date   CHOLECYSTECTOMY  2012   [facility] by Dr. Geroge Baseman   COLONOSCOPY N/A 08/27/2012   Procedure: COLONOSCOPY;  Surgeon: Rogene Houston, MD;  [facility];  Service: Endoscopy;  Laterality: N/A;  325   COLONOSCOPY N/A 01/12/2018   Procedure: COLONOSCOPY;  Surgeon: Rogene Houston, MD;  [facility];  Service: Endoscopy;  Laterality: N/A;  Has staff Christmas party at his house at lunch today per office   COLONOSCOPY N/A 07/21/2019   Procedure: COLONOSCOPY;  Surgeon: Rogene Houston, MD;  [facility];  Service: Endoscopy;  Laterality: N/A;   HERNIA REPAIR Bilateral    Inguinal and umbilical   INCISIONAL HERNIA REPAIR N/A 01/31/2014   Procedure: Fatima Blank HERNIORRHAPHY WITH MESH;  Surgeon: Jamesetta So, MD;  Location: [facility];  Service: General;  Laterality: N/A;   INSERTION OF MESH N/A 01/31/2014   Procedure: INSERTION OF MESH;  Surgeon: Jamesetta So, MD;  Location: [facility];  Service: General;  Laterality: N/A;   PACEMAKER IMPLANT N/A 12/02/2017   St Jude Medical Assurity MRI conditional  dual-chamber pacemaker by Dr Rayann Heman for symptomatic sinus bradycardia   POLYPECTOMY  01/12/2018   Procedure: POLYPECTOMY;  Surgeon: Rogene Houston, MD;  [facility];  Service: Endoscopy;;  transverse colon polyp, ascending colon polyps x2, hepatic flexure polyps x2    POLYPECTOMY  07/21/2019   Procedure: POLYPECTOMY;  Surgeon: Rogene Houston, MD;  [facility];  Service: Endoscopy;;   SHOULDER SURGERY Right 2005?   for open rotator cuff repair. [place]   WOUND DEBRIDEMENT Right 01/31/2014   Procedure: DEBRIDEMENT WOUND RIGHT LEG;  Surgeon: Jamesetta So, MD;  Location: [facility];  Service: General;  Laterality: Right;   WRIST FRACTURE SURGERY Right     Social History   Socioeconomic History   Marital status: Divorced    Spouse name: Not on file   Number of children: Not on file   Years of education: Not on file   Highest education level: Not on file  Occupational History   Not on file  Tobacco Use   Smoking status: Former    Packs/day: 1.00    Years: 10.00    Total pack years: 10.00     Types: Cigarettes    Start date: 10/15/1943    Quit date: 01/28/1954    Years since quitting: 67.5   Smokeless tobacco: Never   Tobacco comments:    tobacco use - no  Vaping Use   Vaping Use: Never used  Substance and Sexual Activity   Alcohol use: No    Alcohol/week: 0.0 standard drinks  of alcohol   Drug use: No   Sexual activity: Not on file  Other Topics Concern   Not on file  Social History Narrative   Not on file   Social Determinants of Health   Financial Resource Strain: Not on file  Food Insecurity: Not on file  Transportation Needs: Not on file  Physical Activity: Not on file  Stress: Not on file  Social Connections: Not on file  Intimate Partner Violence: Not on file    Family History  Problem Relation Age of Onset   Heart failure Mother    Heart failure Father    Cancer Sister        Objective: Vitals:   07/12/21 1504  BP: 103/66  Pulse: 79     Physical Exam  Lab Results:  PSA No results found for: "PSA" No results found for: "TESTOSTERONE"  UA is clear.   Studies/Results: No results found.    Assessment & Plan: BPH with BOO.  He is doing well on current therapy.    Meds ordered this encounter  Medications   finasteride (PROSCAR) 5 MG tablet    Sig: Take 1 tablet (5 mg total) by mouth daily.    Dispense:  90 tablet    Refill:  3     Orders Placed This Encounter  Procedures   Urinalysis, Routine w reflex microscopic      Return in about 1 year (around 07/13/2022).   CC: Burdine, Virgina Evener, MD      [name] 07/13/2021 Patient ID: [name], male   DOB: 1926/05/04, 88 y.o.   MRN: [id number]

## 2021-07-13 LAB — URINALYSIS, ROUTINE W REFLEX MICROSCOPIC
Bilirubin, UA: NEGATIVE
Glucose, UA: NEGATIVE
Ketones, UA: NEGATIVE
Leukocytes,UA: NEGATIVE
Nitrite, UA: NEGATIVE
Protein,UA: NEGATIVE
RBC, UA: NEGATIVE
Specific Gravity, UA: 1.01 (ref 1.005–1.030)
Urobilinogen, Ur: 0.2 mg/dL (ref 0.2–1.0)
pH, UA: 5.5 (ref 5.0–7.5)

## 2021-07-16 ENCOUNTER — Telehealth: Payer: Self-pay

## 2021-07-16 NOTE — Telephone Encounter (Signed)
Pt's son, Braxxton Stoudt, called wanting some information r/t his last doctor visit since the pt is [facility] and has memory issues.  Reviewed chart, son not listed on DPR. Returned call and informed him that this office could not give out any information d/t HIPAA. He asked who was on the [facility] and again reiterated that no info could be given. He stated that he would find out from the pt or his close friend more information and have them reach out.

## 2021-07-24 ENCOUNTER — Other Ambulatory Visit: Payer: Self-pay | Admitting: Internal Medicine

## 2021-08-23 ENCOUNTER — Other Ambulatory Visit: Payer: Self-pay | Admitting: Cardiology

## 2021-08-23 ENCOUNTER — Other Ambulatory Visit: Payer: Self-pay | Admitting: Internal Medicine

## 2021-08-23 DIAGNOSIS — I480 Paroxysmal atrial fibrillation: Secondary | ICD-10-CM

## 2021-08-23 NOTE — Telephone Encounter (Signed)
Prescription refill request for Xarelto received.  Indication:Afib Last office visit:11/22 Weight:82.1 kg Age:88 Scr:1.1 CrCl:49.76  Prescription refilled

## 2021-09-03 DIAGNOSIS — M79675 Pain in left toe(s): Secondary | ICD-10-CM | POA: Diagnosis not present

## 2021-09-03 DIAGNOSIS — S93129A Dislocation of metatarsophalangeal joint of unspecified toe(s), initial encounter: Secondary | ICD-10-CM | POA: Diagnosis not present

## 2021-09-03 DIAGNOSIS — M79672 Pain in left foot: Secondary | ICD-10-CM | POA: Diagnosis not present

## 2021-09-04 DIAGNOSIS — H401113 Primary open-angle glaucoma, right eye, severe stage: Secondary | ICD-10-CM | POA: Diagnosis not present

## 2021-09-04 DIAGNOSIS — H0100A Unspecified blepharitis right eye, upper and lower eyelids: Secondary | ICD-10-CM | POA: Diagnosis not present

## 2021-09-04 DIAGNOSIS — H401122 Primary open-angle glaucoma, left eye, moderate stage: Secondary | ICD-10-CM | POA: Diagnosis not present

## 2021-09-04 DIAGNOSIS — H0100B Unspecified blepharitis left eye, upper and lower eyelids: Secondary | ICD-10-CM | POA: Diagnosis not present

## 2021-09-05 DIAGNOSIS — R2242 Localized swelling, mass and lump, left lower limb: Secondary | ICD-10-CM | POA: Diagnosis not present

## 2021-09-05 DIAGNOSIS — M19072 Primary osteoarthritis, left ankle and foot: Secondary | ICD-10-CM | POA: Diagnosis not present

## 2021-09-05 DIAGNOSIS — M79672 Pain in left foot: Secondary | ICD-10-CM | POA: Diagnosis not present

## 2021-09-05 DIAGNOSIS — M7989 Other specified soft tissue disorders: Secondary | ICD-10-CM | POA: Diagnosis not present

## 2021-09-10 DIAGNOSIS — M79672 Pain in left foot: Secondary | ICD-10-CM | POA: Diagnosis not present

## 2021-09-10 DIAGNOSIS — M1990 Unspecified osteoarthritis, unspecified site: Secondary | ICD-10-CM | POA: Diagnosis not present

## 2021-09-10 DIAGNOSIS — M25579 Pain in unspecified ankle and joints of unspecified foot: Secondary | ICD-10-CM | POA: Diagnosis not present

## 2021-09-26 ENCOUNTER — Ambulatory Visit (INDEPENDENT_AMBULATORY_CARE_PROVIDER_SITE_OTHER): Payer: Medicare Other

## 2021-09-26 DIAGNOSIS — I495 Sick sinus syndrome: Secondary | ICD-10-CM | POA: Diagnosis not present

## 2021-09-26 LAB — CUP PACEART REMOTE DEVICE CHECK
Battery Remaining Longevity: 84 mo
Battery Remaining Percentage: 67 %
Battery Voltage: 3.01 V
Brady Statistic AP VP Percent: 1 %
Brady Statistic AP VS Percent: 92 %
Brady Statistic AS VP Percent: 1 %
Brady Statistic AS VS Percent: 7.7 %
Brady Statistic RA Percent Paced: 91 %
Brady Statistic RV Percent Paced: 1 %
Date Time Interrogation Session: 20230830020014
Implantable Lead Implant Date: 20191105
Implantable Lead Implant Date: 20191105
Implantable Lead Location: 753859
Implantable Lead Location: 753860
Implantable Pulse Generator Implant Date: 20191105
Lead Channel Impedance Value: 410 Ohm
Lead Channel Impedance Value: 440 Ohm
Lead Channel Pacing Threshold Amplitude: 0.25 V
Lead Channel Pacing Threshold Amplitude: 0.875 V
Lead Channel Pacing Threshold Pulse Width: 0.5 ms
Lead Channel Pacing Threshold Pulse Width: 0.5 ms
Lead Channel Sensing Intrinsic Amplitude: 3 mV
Lead Channel Sensing Intrinsic Amplitude: 8.5 mV
Lead Channel Setting Pacing Amplitude: 1.125
Lead Channel Setting Pacing Amplitude: 1.25 V
Lead Channel Setting Pacing Pulse Width: 0.5 ms
Lead Channel Setting Sensing Sensitivity: 2 mV
Pulse Gen Model: 2272
Pulse Gen Serial Number: 9079121

## 2021-10-08 DIAGNOSIS — M79672 Pain in left foot: Secondary | ICD-10-CM | POA: Diagnosis not present

## 2021-10-08 DIAGNOSIS — L110 Acquired keratosis follicularis: Secondary | ICD-10-CM | POA: Diagnosis not present

## 2021-10-08 DIAGNOSIS — M79671 Pain in right foot: Secondary | ICD-10-CM | POA: Diagnosis not present

## 2021-10-08 DIAGNOSIS — I739 Peripheral vascular disease, unspecified: Secondary | ICD-10-CM | POA: Diagnosis not present

## 2021-10-18 NOTE — Progress Notes (Signed)
Remote pacemaker transmission.   

## 2021-11-06 ENCOUNTER — Encounter: Payer: Self-pay | Admitting: Cardiovascular Disease

## 2021-11-13 DIAGNOSIS — W19XXXA Unspecified fall, initial encounter: Secondary | ICD-10-CM | POA: Diagnosis not present

## 2021-11-13 DIAGNOSIS — S0081XA Abrasion of other part of head, initial encounter: Secondary | ICD-10-CM | POA: Diagnosis not present

## 2021-11-13 DIAGNOSIS — M25461 Effusion, right knee: Secondary | ICD-10-CM | POA: Diagnosis not present

## 2021-11-13 DIAGNOSIS — S61411A Laceration without foreign body of right hand, initial encounter: Secondary | ICD-10-CM | POA: Diagnosis not present

## 2021-11-13 DIAGNOSIS — R030 Elevated blood-pressure reading, without diagnosis of hypertension: Secondary | ICD-10-CM | POA: Diagnosis not present

## 2021-11-16 DIAGNOSIS — E871 Hypo-osmolality and hyponatremia: Secondary | ICD-10-CM | POA: Diagnosis not present

## 2021-11-16 DIAGNOSIS — K219 Gastro-esophageal reflux disease without esophagitis: Secondary | ICD-10-CM | POA: Diagnosis not present

## 2021-11-16 DIAGNOSIS — Z1329 Encounter for screening for other suspected endocrine disorder: Secondary | ICD-10-CM | POA: Diagnosis not present

## 2021-11-16 DIAGNOSIS — E7849 Other hyperlipidemia: Secondary | ICD-10-CM | POA: Diagnosis not present

## 2021-11-16 DIAGNOSIS — E559 Vitamin D deficiency, unspecified: Secondary | ICD-10-CM | POA: Diagnosis not present

## 2021-11-16 DIAGNOSIS — R412 Retrograde amnesia: Secondary | ICD-10-CM | POA: Diagnosis not present

## 2021-11-16 DIAGNOSIS — I10 Essential (primary) hypertension: Secondary | ICD-10-CM | POA: Diagnosis not present

## 2021-11-16 DIAGNOSIS — R5383 Other fatigue: Secondary | ICD-10-CM | POA: Diagnosis not present

## 2021-11-21 ENCOUNTER — Other Ambulatory Visit: Payer: Self-pay | Admitting: Cardiology

## 2021-11-27 ENCOUNTER — Encounter: Payer: Self-pay | Admitting: Orthopaedic Surgery

## 2021-11-27 ENCOUNTER — Telehealth: Payer: Self-pay

## 2021-11-27 ENCOUNTER — Ambulatory Visit (INDEPENDENT_AMBULATORY_CARE_PROVIDER_SITE_OTHER): Payer: Medicare Other | Admitting: Orthopaedic Surgery

## 2021-11-27 ENCOUNTER — Ambulatory Visit (INDEPENDENT_AMBULATORY_CARE_PROVIDER_SITE_OTHER): Payer: Medicare Other

## 2021-11-27 DIAGNOSIS — M79604 Pain in right leg: Secondary | ICD-10-CM | POA: Diagnosis not present

## 2021-11-27 NOTE — Telephone Encounter (Signed)
I called to schedule venous doppler of RLE, first at [facility] per patient's request -- 12/03/21 is the first available. I called here in [place] - scheduled for 11/28/21 at 2 pm at [facility]. As the patient was no longer in our office, I called him about the appointment -- he said he could not make that, due to another appointment tomorrow around the [place] area. I cancelled that appointment at the vascular lab and gave the patient the phone number to call to reschedule the ultrasound. I did reiterate to him that he needs to call this ASAP to reschedule this - Dr. Durward Fortes is recommending this be done soon. The patient voiced understanding.   I will hold this message, so to keep a check on this.

## 2021-11-27 NOTE — Progress Notes (Signed)
Office Visit Note   Patient: [name]           Date of Birth: [birth year]           MRN: [id number] Visit Date: 11/27/2021              Requested by: Curlene Labrum, MD [place],  [place] [id number] PCP: Curlene Labrum, MD   Assessment & Plan: Visit Diagnoses:  1. Pain in right leg     Plan: [name] is 88 years old and visited the office with his wife for evaluation of right leg pain.  On October 15 he fell landing directly on the anterior aspect of his right knee and leg.  He is been on Xarelto for "a long time" for heart valve problem and notes that he had quite a bit of bruising.  That seems to be resolving.  His present pain is localized over about the patella extending to the mid tibia.  He was seen by his primary care physician who felt that this was simply a bruise and would resolve over time.  X-rays today were negative.  He does have some resolving ecchymosis about the patella but he had good quick extension and can flex to least 95 to 100 degrees.  He does have some calf pain and although he is on Xarelto would like to obtain a Doppler to be sure that there is not a DVT.  If negative I think he is going to be fine over time and I would agree with the soft tissue bruising diagnosis  Follow-Up Instructions: Return if symptoms worsen or fail to improve.   Orders:  Orders Placed This Encounter  Procedures   XR Tibia/Fibula Right   VAS [place] LOWER EXTREMITY VENOUS (DVT)   No orders of the defined types were placed in this encounter.     Procedures: No procedures performed   Clinical Data: No additional findings.   Subjective: Chief Complaint  Patient presents with   Right Leg - Pain  16 days ago fell directly along the anterior aspect of his right knee and leg.  Still having some discomfort although notes that he is having less bruising.  He is on Xarelto for a heart valve problem.  He does use a cane starts about the patella extending to the mid  tibia  HPI  Review of Systems   Objective: Vital Signs: There were no vitals taken for this visit.  Physical Exam Constitutional:      Appearance: He is well-developed.  Pulmonary:     Effort: Pulmonary effort is normal.  Skin:    General: Skin is warm and dry.  Neurological:     Mental Status: He is alert and oriented to person, place, and time.  Psychiatric:        Behavior: Behavior normal.     Ortho Exam awake alert and oriented x3.  Comfortable sitting.  There is some resolving ecchymosis about the right patella but there is no knee effusion.  He is able to quickly extend his right knee and flex at least 95 degrees without instability.  No specific joint pain.  No crepitation about the patella no pain over the patella tendon.  There was some soft tissue discomfort along the anterior tibia region from the proximal tibia to about the mid tibia.  There are some venous stasis changes distally which she notes are chronic.  He does have some chronic edema both of his ankles.  There was some calf pain and minimally positive [place]  Specialty Comments:  No specialty comments available.  Imaging: XR Tibia/Fibula Right  Result Date: 11/27/2021 Films of the right tib-fib were obtained in 2 projections.  There is no obvious acute injury.  Limited view of the knee did not demonstrate any acute injury either.  No patella fracture.  Patient is complaining of pain along the anterior aspect of the knee extending to about the mid tibia but no obvious changes by film    PMFS History: Patient Active Problem List   Diagnosis Date Noted   Pain in right leg 11/27/2021   Educated about COVID-19 virus infection 09/23/2019   GI bleed 07/20/2019   Rectal bleeding 01/11/2018   BPH (benign prostatic hyperplasia) 01/11/2018   AAA (abdominal aortic aneurysm) ([place]) 01/11/2018   Hypertension 01/11/2018   GERD (gastroesophageal reflux disease) 01/11/2018   Sick sinus syndrome ([place]) 12/02/2017    Generalized weakness 09/25/2016   Shortness of breath 09/25/2016   Colitis 11/17/2014   Colonic adenoma 08/11/2012   Preoperative clearance 08/16/2010   LEG EDEMA 05/10/2009   DYSLIPIDEMIA 01/24/2009   HIATAL HERNIA WITH REFLUX 01/24/2009   NONSPEC ABN FINDNG RAD & OTH EXAM ABDOMINAL AREA 11/17/2008   ATRIAL FIBRILLATION 09/21/2008   ABDOMINAL AORTIC ANEURYSM 09/21/2008   ORTHOSTATIC HYPOTENSION 09/21/2008   Past Medical History:  Diagnosis Date   AAA (abdominal aortic aneurysm) (HCC)    Atrial fibrillation (HCC)    bicarbonate perfusion study 10/11 EF 69%. small defects no ischemia. 7 metastases acheived. ER visit 11/10/09 ruled out MI normal sinus rhythm orthostasis after sublingual nitroglycerin    BPH (benign prostatic hyperplasia)    Chronic back pain    GERD (gastroesophageal reflux disease)    Hypertension    Orthostatic hypotension    PONV (postoperative nausea and vomiting)    Sleep apnea    CPAP at night   Stress fracture of foot    left   Swelling of both lower extremities     Family History  Problem Relation Age of Onset   Heart failure Mother    Heart failure Father    Cancer Sister     Past Surgical History:  Procedure Laterality Date   CHOLECYSTECTOMY  2012   [facility] by Dr. Geroge Baseman   COLONOSCOPY N/A 08/27/2012   Procedure: COLONOSCOPY;  Surgeon: Rogene Houston, MD;  [facility];  Service: Endoscopy;  Laterality: N/A;  325   COLONOSCOPY N/A 01/12/2018   Procedure: COLONOSCOPY;  Surgeon: Rogene Houston, MD;  [facility];  Service: Endoscopy;  Laterality: N/A;  Has staff Christmas party at his house at lunch today per office   COLONOSCOPY N/A 07/21/2019   Procedure: COLONOSCOPY;  Surgeon: Rogene Houston, MD;  [facility];  Service: Endoscopy;  Laterality: N/A;   HERNIA REPAIR Bilateral    Inguinal and umbilical   INCISIONAL HERNIA REPAIR N/A 01/31/2014   Procedure: Fatima Blank HERNIORRHAPHY WITH MESH;  Surgeon:  Jamesetta So, MD;  Location: [facility];  Service: General;  Laterality: N/A;   INSERTION OF MESH N/A 01/31/2014   Procedure: INSERTION OF MESH;  Surgeon: Jamesetta So, MD;  Location: [facility];  Service: General;  Laterality: N/A;   PACEMAKER IMPLANT N/A 12/02/2017   St Jude Medical Assurity MRI conditional  dual-chamber pacemaker by Dr Rayann Heman for symptomatic sinus bradycardia   POLYPECTOMY  01/12/2018   Procedure: POLYPECTOMY;  Surgeon: Rogene Houston, MD;  [facility];  Service: Endoscopy;;  transverse colon polyp, ascending colon polyps x2, hepatic flexure polyps x2    POLYPECTOMY  07/21/2019   Procedure: POLYPECTOMY;  Surgeon: Rogene Houston, MD;  [facility];  Service: Endoscopy;;   SHOULDER SURGERY Right 2005?   for open rotator cuff repair. [place]   WOUND DEBRIDEMENT Right 01/31/2014   Procedure: DEBRIDEMENT WOUND RIGHT LEG;  Surgeon: Jamesetta So, MD;  Location: [facility];  Service: General;  Laterality: Right;   WRIST FRACTURE SURGERY Right    Social History   Occupational History   Not on file  Tobacco Use   Smoking status: Former    Packs/day: 1.00    Years: 10.00    Total pack years: 10.00    Types: Cigarettes    Start date: 10/15/1943    Quit date: 01/28/1954    Years since quitting: 67.8   Smokeless tobacco: Never   Tobacco comments:    tobacco use - no  Vaping Use   Vaping Use: Never used  Substance and Sexual Activity   Alcohol use: No    Alcohol/week: 0.0 standard drinks of alcohol   Drug use: No   Sexual activity: Not on file     Garald Balding, MD   Note - This record has been created using [employer].  Chart creation errors have been sought, but may not always  have been located. Such creation errors do not reflect on  the standard of medical care.

## 2021-11-28 ENCOUNTER — Encounter (HOSPITAL_COMMUNITY): Payer: Medicare Other

## 2021-11-28 NOTE — Telephone Encounter (Signed)
thanks

## 2021-11-30 ENCOUNTER — Encounter: Payer: Medicare Other | Admitting: Internal Medicine

## 2021-11-30 ENCOUNTER — Ambulatory Visit (HOSPITAL_COMMUNITY)
Admission: RE | Admit: 2021-11-30 | Discharge: 2021-11-30 | Disposition: A | Discharge status: Home / Self Care | Payer: Medicare Other | Source: Ambulatory Visit | Attending: Physician Assistant | Admitting: Physician Assistant

## 2021-11-30 DIAGNOSIS — M79604 Pain in right leg: Secondary | ICD-10-CM | POA: Diagnosis not present

## 2021-12-04 ENCOUNTER — Encounter: Payer: Medicare Other | Admitting: Cardiovascular Disease

## 2021-12-06 DIAGNOSIS — Z23 Encounter for immunization: Secondary | ICD-10-CM | POA: Diagnosis not present

## 2021-12-17 DIAGNOSIS — M79672 Pain in left foot: Secondary | ICD-10-CM | POA: Diagnosis not present

## 2021-12-17 DIAGNOSIS — I739 Peripheral vascular disease, unspecified: Secondary | ICD-10-CM | POA: Diagnosis not present

## 2021-12-17 DIAGNOSIS — L110 Acquired keratosis follicularis: Secondary | ICD-10-CM | POA: Diagnosis not present

## 2021-12-17 DIAGNOSIS — M79671 Pain in right foot: Secondary | ICD-10-CM | POA: Diagnosis not present

## 2021-12-26 ENCOUNTER — Ambulatory Visit (INDEPENDENT_AMBULATORY_CARE_PROVIDER_SITE_OTHER): Payer: Medicare Other

## 2021-12-26 DIAGNOSIS — I495 Sick sinus syndrome: Secondary | ICD-10-CM

## 2021-12-26 LAB — CUP PACEART REMOTE DEVICE CHECK
Battery Remaining Longevity: 80 mo
Battery Remaining Percentage: 65 %
Battery Voltage: 3.01 V
Brady Statistic AP VP Percent: 1 %
Brady Statistic AP VS Percent: 92 %
Brady Statistic AS VP Percent: 1 %
Brady Statistic AS VS Percent: 8 %
Brady Statistic RA Percent Paced: 91 %
Brady Statistic RV Percent Paced: 1 %
Date Time Interrogation Session: 20231129020013
Implantable Lead Connection Status: 753985
Implantable Lead Connection Status: 753985
Implantable Lead Implant Date: 20191105
Implantable Lead Implant Date: 20191105
Implantable Lead Location: 753859
Implantable Lead Location: 753860
Implantable Pulse Generator Implant Date: 20191105
Lead Channel Impedance Value: 430 Ohm
Lead Channel Impedance Value: 440 Ohm
Lead Channel Pacing Threshold Amplitude: 0.375 V
Lead Channel Pacing Threshold Amplitude: 0.875 V
Lead Channel Pacing Threshold Pulse Width: 0.5 ms
Lead Channel Pacing Threshold Pulse Width: 0.5 ms
Lead Channel Sensing Intrinsic Amplitude: 2.1 mV
Lead Channel Sensing Intrinsic Amplitude: 8.8 mV
Lead Channel Setting Pacing Amplitude: 1.125
Lead Channel Setting Pacing Amplitude: 1.375
Lead Channel Setting Pacing Pulse Width: 0.5 ms
Lead Channel Setting Sensing Sensitivity: 2 mV
Pulse Gen Model: 2272
Pulse Gen Serial Number: 9079121

## 2022-01-04 ENCOUNTER — Encounter: Payer: Self-pay | Admitting: Cardiovascular Disease

## 2022-01-04 ENCOUNTER — Ambulatory Visit: Payer: Medicare Other | Attending: Internal Medicine | Admitting: Cardiovascular Disease

## 2022-01-04 VITALS — BP 108/82 | HR 69 | Ht 68.0 in | Wt 182.6 lb

## 2022-01-04 DIAGNOSIS — I480 Paroxysmal atrial fibrillation: Secondary | ICD-10-CM

## 2022-01-04 NOTE — Patient Instructions (Signed)
Medication Instructions:  Continue all current medications.  Labwork: none  Testing/Procedures: none  Follow-Up: 1 year - Dr.  Mealor    Any Other Special Instructions Will Be Listed Below (If Applicable).   If you need a refill on your cardiac medications before your next appointment, please call your pharmacy.  

## 2022-01-04 NOTE — Progress Notes (Signed)
PCP: Curlene Labrum, MD   Primary EP:  Dr Myles Gip   [name] is a 88 y.o. male who presents today for routine electrophysiology followup.  Since last being seen in our clinic, the patient reports doing very well.  Today, he denies symptoms of palpitations, chest pain, shortness of breath,  lower extremity edema, dizziness, presyncope, or syncope.  The patient is otherwise without complaint today.   Past Medical History:  Diagnosis Date   AAA (abdominal aortic aneurysm) (HCC)    Atrial fibrillation (HCC)    bicarbonate perfusion study 10/11 EF 69%. small defects no ischemia. 7 metastases acheived. ER visit 11/10/09 ruled out MI normal sinus rhythm orthostasis after sublingual nitroglycerin    BPH (benign prostatic hyperplasia)    Chronic back pain    GERD (gastroesophageal reflux disease)    Hypertension    Orthostatic hypotension    PONV (postoperative nausea and vomiting)    Sleep apnea    CPAP at night   Stress fracture of foot    left   Swelling of both lower extremities    Past Surgical History:  Procedure Laterality Date   CHOLECYSTECTOMY  2012   [facility] by Dr. Geroge Baseman   COLONOSCOPY N/A 08/27/2012   Procedure: COLONOSCOPY;  Surgeon: Rogene Houston, MD;  [facility];  Service: Endoscopy;  Laterality: N/A;  325   COLONOSCOPY N/A 01/12/2018   Procedure: COLONOSCOPY;  Surgeon: Rogene Houston, MD;  [facility];  Service: Endoscopy;  Laterality: N/A;  Has staff Christmas party at his house at lunch today per office   COLONOSCOPY N/A 07/21/2019   Procedure: COLONOSCOPY;  Surgeon: Rogene Houston, MD;  [facility];  Service: Endoscopy;  Laterality: N/A;   HERNIA REPAIR Bilateral    Inguinal and umbilical   INCISIONAL HERNIA REPAIR N/A 01/31/2014   Procedure: Fatima Blank HERNIORRHAPHY WITH MESH;  Surgeon: Jamesetta So, MD;  Location: [facility];  Service: General;  Laterality: N/A;   INSERTION OF MESH N/A 01/31/2014   Procedure:  INSERTION OF MESH;  Surgeon: Jamesetta So, MD;  Location: [facility];  Service: General;  Laterality: N/A;   PACEMAKER IMPLANT N/A 12/02/2017   St Jude Medical Assurity MRI conditional  dual-chamber pacemaker by Dr Rayann Heman for symptomatic sinus bradycardia   POLYPECTOMY  01/12/2018   Procedure: POLYPECTOMY;  Surgeon: Rogene Houston, MD;  [facility];  Service: Endoscopy;;  transverse colon polyp, ascending colon polyps x2, hepatic flexure polyps x2    POLYPECTOMY  07/21/2019   Procedure: POLYPECTOMY;  Surgeon: Rogene Houston, MD;  [facility];  Service: Endoscopy;;   SHOULDER SURGERY Right 2005?   for open rotator cuff repair. [facility]   WOUND DEBRIDEMENT Right 01/31/2014   Procedure: DEBRIDEMENT WOUND RIGHT LEG;  Surgeon: Jamesetta So, MD;  Location: [facility];  Service: General;  Laterality: Right;   WRIST FRACTURE SURGERY Right     ROS- all systems are reviewed and negative except as per HPI above  Current Outpatient Medications  Medication Sig Dispense Refill   alfuzosin (UROXATRAL) 10 MG 24 hr tablet TAKE 1 TABLET BY MOUTH DAILY WITH BREAKFAST 90 tablet 3   CALCIUM-MAGNESIUM-ZINC PO Take 1 tablet by mouth daily.     cholecalciferol (VITAMIN D3) 25 MCG (1000 UT) tablet Take 1,000 Units by mouth daily.     dorzolamide (TRUSOPT) 2 % ophthalmic solution Place 1 drop into the right eye  2 (two) times daily.     esomeprazole (NEXIUM) 20 MG capsule Take 20 mg by mouth daily.     finasteride (PROSCAR) 5 MG tablet Take 1 tablet (5 mg total) by mouth daily. 90 tablet 3   furosemide (LASIX) 40 MG tablet TAKE 1 TABLET BY MOUTH EVERY DAY 90 tablet 3   latanoprost (XALATAN) 0.005 % ophthalmic solution Place 1 drop into both eyes at bedtime.     metoprolol tartrate (LOPRESSOR) 25 MG tablet TAKE 1/2 TABLET BY MOUTH ONCE DAILY 30 tablet 6   Omega-3 Fatty Acids (OMEGA-3 2100 PO) Take 2,100 mg by mouth daily.     potassium chloride SA (KLOR-CON M) 20 MEQ tablet TAKE 1  TABLET BY MOUTH EVERY DAY 90 tablet 1   UNABLE TO FIND at bedtime. On CPAP     XARELTO 20 MG TABS tablet TAKE 1 TABLET BY MOUTH EVERY DAY WITH SUPPER. 30 tablet 6   Current Facility-Administered Medications  Medication Dose Route Frequency Provider Last Rate Last Admin   sodium chloride flush (NS) 0.9 % injection 3 mL  3 mL Intravenous Q12H Herminio Commons, MD        Physical Exam: Vitals:   01/04/22 1345  BP: 108/82  Pulse: 69  SpO2: 97%  Weight: 182 lb 9.6 oz (82.8 kg)  Height: '5\' 8"'$  (1.727 m)    GEN- The patient is well appearing, alert and oriented x 3 today.   Lungs- Clear to ausculation bilaterally Chest- pacemaker pocket is well healed Heart- Regular rate and rhythm, no murmurs, rubs or gallops Extremities- no clubbing, cyanosis, or edema  Pacemaker interrogation- reviewed in detail today,  See PACEART report  Assessment and Plan:  1. Symptomatic sinus bradycardia  Normal pacemaker function See Pace Art report No changes today he is not device dependant today  2. Paroxysmal atrial fibrillation Afib burden is <1%  Chads2vasc score is is at least 3.  Continue xarelto Obtain records for recent bmet from PCP  3. HTN Stable No change required today  Return in a year  Melida Quitter, MD  01/04/2022 2:05 PM

## 2022-01-07 ENCOUNTER — Encounter: Payer: Self-pay | Admitting: *Deleted

## 2022-01-21 ENCOUNTER — Other Ambulatory Visit: Payer: Self-pay | Admitting: Internal Medicine

## 2022-01-23 NOTE — Progress Notes (Signed)
Remote pacemaker transmission.   

## 2022-02-01 ENCOUNTER — Other Ambulatory Visit: Payer: Self-pay | Admitting: Internal Medicine

## 2022-02-25 DIAGNOSIS — M79671 Pain in right foot: Secondary | ICD-10-CM | POA: Diagnosis not present

## 2022-02-25 DIAGNOSIS — I739 Peripheral vascular disease, unspecified: Secondary | ICD-10-CM | POA: Diagnosis not present

## 2022-02-25 DIAGNOSIS — L110 Acquired keratosis follicularis: Secondary | ICD-10-CM | POA: Diagnosis not present

## 2022-02-25 DIAGNOSIS — M79672 Pain in left foot: Secondary | ICD-10-CM | POA: Diagnosis not present

## 2022-03-25 ENCOUNTER — Other Ambulatory Visit: Payer: Self-pay | Admitting: Cardiology

## 2022-03-25 DIAGNOSIS — I480 Paroxysmal atrial fibrillation: Secondary | ICD-10-CM

## 2022-03-25 NOTE — Telephone Encounter (Signed)
Prescription refill request for Xarelto received.  Indication: afib  Last office visit:01/04/2022 Weight: 82.8 kg  Age: 89 yo  Scr: 1.12, 02/05/2021 CrCl: 48 ml/min   Pt is overdue for blood work and needs dose change.

## 2022-03-25 NOTE — Telephone Encounter (Signed)
Attempted to call pt and received a busy signal.  Attempted to call PCP, however, received message the number on file is no longer in service.

## 2022-03-27 ENCOUNTER — Ambulatory Visit: Payer: Medicare Other

## 2022-03-27 DIAGNOSIS — I495 Sick sinus syndrome: Secondary | ICD-10-CM

## 2022-03-27 LAB — CUP PACEART REMOTE DEVICE CHECK
Battery Remaining Longevity: 78 mo
Battery Remaining Percentage: 62 %
Battery Voltage: 3.01 V
Brady Statistic AP VP Percent: 1 %
Brady Statistic AP VS Percent: 83 %
Brady Statistic AS VP Percent: 1 %
Brady Statistic AS VS Percent: 16 %
Brady Statistic RA Percent Paced: 82 %
Brady Statistic RV Percent Paced: 1 %
Date Time Interrogation Session: 20240228035134
Implantable Lead Connection Status: 753985
Implantable Lead Connection Status: 753985
Implantable Lead Implant Date: 20191105
Implantable Lead Implant Date: 20191105
Implantable Lead Location: 753859
Implantable Lead Location: 753860
Implantable Pulse Generator Implant Date: 20191105
Lead Channel Impedance Value: 430 Ohm
Lead Channel Impedance Value: 450 Ohm
Lead Channel Pacing Threshold Amplitude: 0.375 V
Lead Channel Pacing Threshold Amplitude: 0.875 V
Lead Channel Pacing Threshold Pulse Width: 0.5 ms
Lead Channel Pacing Threshold Pulse Width: 0.5 ms
Lead Channel Sensing Intrinsic Amplitude: 2.1 mV
Lead Channel Sensing Intrinsic Amplitude: 8.6 mV
Lead Channel Setting Pacing Amplitude: 1.125
Lead Channel Setting Pacing Amplitude: 1.375
Lead Channel Setting Pacing Pulse Width: 0.5 ms
Lead Channel Setting Sensing Sensitivity: 2 mV
Pulse Gen Model: 2272
Pulse Gen Serial Number: 9079121

## 2022-03-27 NOTE — Telephone Encounter (Signed)
Prescription refill request for Xarelto received.  Indication: afib  Last office visit:01/04/2022 Weight: 82.8 kg  Age: 89 yo  Scr: 1.01 on 11/19/21 CrCl: 53.51 ml/min      Most recent lab documented was 11/19/21. Based on above findings Xarelto '20mg'$  daily is the appropriate dose.  Refill approved.

## 2022-05-01 NOTE — Progress Notes (Signed)
Remote pacemaker transmission.   

## 2022-05-13 DIAGNOSIS — L110 Acquired keratosis follicularis: Secondary | ICD-10-CM | POA: Diagnosis not present

## 2022-05-13 DIAGNOSIS — I739 Peripheral vascular disease, unspecified: Secondary | ICD-10-CM | POA: Diagnosis not present

## 2022-05-13 DIAGNOSIS — M79671 Pain in right foot: Secondary | ICD-10-CM | POA: Diagnosis not present

## 2022-05-13 DIAGNOSIS — M79672 Pain in left foot: Secondary | ICD-10-CM | POA: Diagnosis not present

## 2022-05-28 DIAGNOSIS — I480 Paroxysmal atrial fibrillation: Secondary | ICD-10-CM | POA: Diagnosis not present

## 2022-05-28 DIAGNOSIS — E782 Mixed hyperlipidemia: Secondary | ICD-10-CM | POA: Diagnosis not present

## 2022-05-28 DIAGNOSIS — M810 Age-related osteoporosis without current pathological fracture: Secondary | ICD-10-CM | POA: Diagnosis not present

## 2022-06-21 DIAGNOSIS — Z961 Presence of intraocular lens: Secondary | ICD-10-CM | POA: Diagnosis not present

## 2022-06-21 DIAGNOSIS — H26493 Other secondary cataract, bilateral: Secondary | ICD-10-CM | POA: Diagnosis not present

## 2022-06-21 DIAGNOSIS — H52203 Unspecified astigmatism, bilateral: Secondary | ICD-10-CM | POA: Diagnosis not present

## 2022-06-21 DIAGNOSIS — H0100B Unspecified blepharitis left eye, upper and lower eyelids: Secondary | ICD-10-CM | POA: Diagnosis not present

## 2022-06-21 DIAGNOSIS — H0100A Unspecified blepharitis right eye, upper and lower eyelids: Secondary | ICD-10-CM | POA: Diagnosis not present

## 2022-06-21 DIAGNOSIS — H524 Presbyopia: Secondary | ICD-10-CM | POA: Diagnosis not present

## 2022-06-21 DIAGNOSIS — H401113 Primary open-angle glaucoma, right eye, severe stage: Secondary | ICD-10-CM | POA: Diagnosis not present

## 2022-06-21 DIAGNOSIS — H35363 Drusen (degenerative) of macula, bilateral: Secondary | ICD-10-CM | POA: Diagnosis not present

## 2022-06-24 ENCOUNTER — Other Ambulatory Visit: Payer: Self-pay | Admitting: Urology

## 2022-06-24 DIAGNOSIS — N401 Enlarged prostate with lower urinary tract symptoms: Secondary | ICD-10-CM

## 2022-06-26 ENCOUNTER — Ambulatory Visit (INDEPENDENT_AMBULATORY_CARE_PROVIDER_SITE_OTHER): Payer: Medicare Other

## 2022-06-26 DIAGNOSIS — I495 Sick sinus syndrome: Secondary | ICD-10-CM

## 2022-06-27 LAB — CUP PACEART REMOTE DEVICE CHECK
Battery Remaining Longevity: 75 mo
Battery Remaining Percentage: 60 %
Battery Voltage: 3.01 V
Brady Statistic AP VP Percent: 1 %
Brady Statistic AP VS Percent: 88 %
Brady Statistic AS VP Percent: 1 %
Brady Statistic AS VS Percent: 12 %
Brady Statistic RA Percent Paced: 87 %
Brady Statistic RV Percent Paced: 1 %
Date Time Interrogation Session: 20240529020015
Implantable Lead Connection Status: 753985
Implantable Lead Connection Status: 753985
Implantable Lead Implant Date: 20191105
Implantable Lead Implant Date: 20191105
Implantable Lead Location: 753859
Implantable Lead Location: 753860
Implantable Pulse Generator Implant Date: 20191105
Lead Channel Impedance Value: 400 Ohm
Lead Channel Impedance Value: 430 Ohm
Lead Channel Pacing Threshold Amplitude: 0.375 V
Lead Channel Pacing Threshold Amplitude: 1 V
Lead Channel Pacing Threshold Pulse Width: 0.5 ms
Lead Channel Pacing Threshold Pulse Width: 0.5 ms
Lead Channel Sensing Intrinsic Amplitude: 2.7 mV
Lead Channel Sensing Intrinsic Amplitude: 7.4 mV
Lead Channel Setting Pacing Amplitude: 1.25 V
Lead Channel Setting Pacing Amplitude: 1.375
Lead Channel Setting Pacing Pulse Width: 0.5 ms
Lead Channel Setting Sensing Sensitivity: 2 mV
Pulse Gen Model: 2272
Pulse Gen Serial Number: 9079121

## 2022-07-11 ENCOUNTER — Ambulatory Visit: Payer: Medicare Other | Admitting: Urology

## 2022-07-11 ENCOUNTER — Encounter: Payer: Self-pay | Admitting: Urology

## 2022-07-11 ENCOUNTER — Ambulatory Visit (INDEPENDENT_AMBULATORY_CARE_PROVIDER_SITE_OTHER): Payer: Medicare Other | Admitting: Urology

## 2022-07-11 VITALS — BP 114/77 | HR 65

## 2022-07-11 DIAGNOSIS — N138 Other obstructive and reflux uropathy: Secondary | ICD-10-CM | POA: Diagnosis not present

## 2022-07-11 DIAGNOSIS — N401 Enlarged prostate with lower urinary tract symptoms: Secondary | ICD-10-CM

## 2022-07-11 DIAGNOSIS — R350 Frequency of micturition: Secondary | ICD-10-CM

## 2022-07-11 DIAGNOSIS — R339 Retention of urine, unspecified: Secondary | ICD-10-CM

## 2022-07-11 LAB — BLADDER SCAN AMB NON-IMAGING: Scan Result: 184

## 2022-07-11 NOTE — Progress Notes (Signed)
Pt here today for bladder scan. Bladder was scanned and 184 was visualized.    Performed by Kennyth Lose, CMA

## 2022-07-11 NOTE — Progress Notes (Signed)
Subjective:  1. Michael localized prostatic hyperplasia with lower urinary tract symptoms (LUTS)   2. Frequent urination   3. Incomplete bladder emptying     Mr. Bandura returns today in f/u for his history of BPH with BOO.  He is on finasteride and alfuzosin. His UA is clear and his IPSS is 4 with nocturia x 1.  He has had no associated signs or syptoms.   He has some chronic right low back pain.  PVR is .  His meds were renewed by me on 06/21/22.      IPSS     Row Name 07/11/22 1600         International Prostate Symptom Score   How often have you had the sensation of not emptying your bladder? Not at All     How often have you had to urinate less than every two hours? Less than half the time     How often have you found you stopped and started again several times when you urinated? Not at All     How often have you found it difficult to postpone urination? Not at All     How often have you had a weak urinary stream? Less than 1 in 5 times     How often have you had to strain to start urination? Not at All     How many times did you typically get up at night to urinate? 1 Time     Total IPSS Score 4       Quality of Life due to urinary symptoms   If you were to spend the rest of your life with your urinary condition just the way it is now how would you feel about that? Pleased                ROS:  ROS:  A complete review of systems was performed.  All systems are negative except for pertinent findings as noted.   Review of Systems  Musculoskeletal:  Positive for back pain.  All other systems reviewed and are negative.   Allergies  Allergen Reactions   Flexeril [Cyclobenzaprine Hcl] Nausea And Vomiting   Percocet [Oxycodone-Acetaminophen] Nausea And Vomiting   Vicodin [Hydrocodone-Acetaminophen] Nausea And Vomiting   Sulfonamide Derivatives Rash    Outpatient Encounter Medications as of 07/11/2022  Medication Sig   alfuzosin (UROXATRAL) 10 MG 24 hr tablet  TAKE 1 TABLET BY MOUTH DAILY WITH BREAKFAST   amLODipine (NORVASC) 2.5 MG tablet Take 2.5 mg by mouth daily. Patient takes 1/2 tablet daily   CALCIUM-MAGNESIUM-ZINC PO Take 1 tablet by mouth daily.   cholecalciferol (VITAMIN D3) 25 MCG (1000 UT) tablet Take 1,000 Units by mouth daily.   dorzolamide (TRUSOPT) 2 % ophthalmic solution Place 1 drop into the right eye 2 (two) times daily.   esomeprazole (NEXIUM) 20 MG capsule Take 20 mg by mouth daily.   finasteride (PROSCAR) 5 MG tablet TAKE 1 TABLET BY MOUTH DAILY   furosemide (LASIX) 40 MG tablet TAKE 1 TABLET BY MOUTH EVERY DAY   latanoprost (XALATAN) 0.005 % ophthalmic solution Place 1 drop into both eyes at bedtime.   metoprolol tartrate (LOPRESSOR) 25 MG tablet TAKE 1/2 TABLET BY MOUTH ONCE DAILY   Omega-3 Fatty Acids (OMEGA-3 2100 PO) Take 2,100 mg by mouth daily.   potassium chloride SA (KLOR-CON M) 20 MEQ tablet TAKE 1 TABLET BY MOUTH EVERY DAY   rivaroxaban (XARELTO) 20 MG TABS tablet TAKE 1 TABLET BY MOUTH EVERY DAY  WITH SUPPER.   UNABLE TO FIND at bedtime. On CPAP   Facility-Administered Encounter Medications as of 07/11/2022  Medication   sodium chloride flush (NS) 0.9 % injection 3 mL    Past Medical History:  Diagnosis Date   AAA (abdominal aortic aneurysm) (HCC)    Atrial fibrillation (HCC)    bicarbonate perfusion study 10/11 EF 69%. small defects no ischemia. 7 metastases acheived. ER visit 11/10/09 ruled out MI normal sinus rhythm orthostasis after sublingual nitroglycerin    BPH (Michael prostatic hyperplasia)    Chronic back pain    CPAP (continuous positive airway pressure) dependence    GERD (gastroesophageal reflux disease)    Hypertension    Orthostatic hypotension    Pacemaker    PONV (postoperative nausea and vomiting)    Sleep apnea    CPAP at night   Stress fracture of foot    left   Swelling of both lower extremities     Past Surgical History:  Procedure Laterality Date   CHOLECYSTECTOMY  2012    [facility] by Dr. Leticia Penna   COLONOSCOPY N/A 08/27/2012   Procedure: COLONOSCOPY;  Surgeon: Malissa Hippo, MD;  [facility];  Service: Endoscopy;  Laterality: N/A;  325   COLONOSCOPY N/A 01/12/2018   Procedure: COLONOSCOPY;  Surgeon: Malissa Hippo, MD;  [facility];  Service: Endoscopy;  Laterality: N/A;  Has staff Christmas party at his house at lunch today per office   COLONOSCOPY N/A 07/21/2019   Procedure: COLONOSCOPY;  Surgeon: Malissa Hippo, MD;  [facility];  Service: Endoscopy;  Laterality: N/A;   HERNIA REPAIR Bilateral    Inguinal and umbilical   INCISIONAL HERNIA REPAIR N/A 01/31/2014   Procedure: Sherald Hess HERNIORRHAPHY WITH MESH;  Surgeon: Dalia Heading, MD;  Location: [facility];  Service: General;  Laterality: N/A;   INSERTION OF MESH N/A 01/31/2014   Procedure: INSERTION OF MESH;  Surgeon: Dalia Heading, MD;  Location: [facility];  Service: General;  Laterality: N/A;   PACEMAKER IMPLANT N/A 12/02/2017   St Jude Medical Assurity MRI conditional  dual-chamber pacemaker by Dr Johney Frame for symptomatic sinus bradycardia   POLYPECTOMY  01/12/2018   Procedure: POLYPECTOMY;  Surgeon: Malissa Hippo, MD;  [facility];  Service: Endoscopy;;  transverse colon polyp, ascending colon polyps x2, hepatic flexure polyps x2    POLYPECTOMY  07/21/2019   Procedure: POLYPECTOMY;  Surgeon: Malissa Hippo, MD;  [facility];  Service: Endoscopy;;   SHOULDER SURGERY Right 2005?   for open rotator cuff repair. Outpt [facility]   WOUND DEBRIDEMENT Right 01/31/2014   Procedure: DEBRIDEMENT WOUND RIGHT LEG;  Surgeon: Dalia Heading, MD;  Location: [facility];  Service: General;  Laterality: Right;   WRIST FRACTURE SURGERY Right     Social History   Socioeconomic History   Marital status: Divorced    Spouse name: Not on file   Number of children: Not on file   Years of education: Not on file   Highest education level: Not on file   Occupational History   Not on file  Tobacco Use   Smoking status: Former    Packs/day: 1.00    Years: 10.00    Additional pack years: 0.00    Total pack years: 10.00    Types: Cigarettes    Start date: 10/15/1943    Quit date: 01/28/1954    Years since quitting: 68.5   Smokeless tobacco:  Never   Tobacco comments:    tobacco use - no  Vaping Use   Vaping Use: Never used  Substance and Sexual Activity   Alcohol use: No    Alcohol/week: 0.0 standard drinks of alcohol   Drug use: No   Sexual activity: Not on file  Other Topics Concern   Not on file  Social History Narrative   Not on file   Social Determinants of Health   Financial Resource Strain: Not on file  Food Insecurity: Not on file  Transportation Needs: Not on file  Physical Activity: Not on file  Stress: Not on file  Social Connections: Not on file  Intimate Partner Violence: Not on file    Family History  Problem Relation Age of Onset   Heart failure Mother    Heart failure Father    Cancer Sister        Objective: Vitals:   07/11/22 1550  BP: 114/77  Pulse: 65     Physical Exam  Lab Results:  PSA No results found for: "PSA" No results found for: "TESTOSTERONE"  UA is clear.  Results for orders placed or performed in visit on 07/11/22 (from the past 72 hour(s))  Urinalysis, Routine w reflex microscopic     Status: None   Collection Time: 07/11/22  3:51 PM  Result Value Ref Range   Specific Gravity, UA 1.015 1.005 - 1.030   pH, UA 6.0 5.0 - 7.5   Color, UA Yellow Yellow   Appearance Ur Clear Clear   Leukocytes,UA Negative Negative   Protein,UA Negative Negative/Trace   Glucose, UA Negative Negative   Ketones, UA Negative Negative   RBC, UA Negative Negative   Bilirubin, UA Negative Negative   Urobilinogen, Ur 0.2 0.2 - 1.0 mg/dL   Nitrite, UA Negative Negative   Microscopic Examination Comment     Comment: Microscopic not indicated and not performed.    Studies/Results: No  results found. PVR is   Assessment & Plan: BPH with BOO.  He is doing well on current therapy.    No orders of the defined types were placed in this encounter.    Orders Placed This Encounter  Procedures   Urinalysis, Routine w reflex microscopic   BLADDER SCAN AMB NON-IMAGING      Return in about 1 year (around 07/11/2023) for with PVR. Marland Kitchen   CC: Burdine, Ananias Pilgrim, MD      Bjorn Pippin 07/13/2022 Patient ID: Rebecka Apley, male   DOB: 30-Apr-1926, 89 y.o.   MRN: [id number] Patient ID: BEULAH SOPPE, male   DOB: September 29, 1926, 89 y.o.   MRN: [id number]

## 2022-07-12 LAB — URINALYSIS, ROUTINE W REFLEX MICROSCOPIC
Bilirubin, UA: NEGATIVE
Glucose, UA: NEGATIVE
Ketones, UA: NEGATIVE
Leukocytes,UA: NEGATIVE
Nitrite, UA: NEGATIVE
Protein,UA: NEGATIVE
RBC, UA: NEGATIVE
Specific Gravity, UA: 1.015 (ref 1.005–1.030)
Urobilinogen, Ur: 0.2 mg/dL (ref 0.2–1.0)
pH, UA: 6 (ref 5.0–7.5)

## 2022-07-18 NOTE — Progress Notes (Signed)
Remote pacemaker transmission.   

## 2022-07-22 ENCOUNTER — Other Ambulatory Visit: Payer: Self-pay | Admitting: *Deleted

## 2022-07-22 MED ORDER — POTASSIUM CHLORIDE CRYS ER 20 MEQ PO TBCR: 20.0000 meq | EXTENDED_RELEASE_TABLET | Freq: Every day | ORAL | 1 refills | Status: DC

## 2022-07-23 DIAGNOSIS — M79671 Pain in right foot: Secondary | ICD-10-CM | POA: Diagnosis not present

## 2022-07-23 DIAGNOSIS — L110 Acquired keratosis follicularis: Secondary | ICD-10-CM | POA: Diagnosis not present

## 2022-07-23 DIAGNOSIS — I739 Peripheral vascular disease, unspecified: Secondary | ICD-10-CM | POA: Diagnosis not present

## 2022-07-23 DIAGNOSIS — M79672 Pain in left foot: Secondary | ICD-10-CM | POA: Diagnosis not present

## 2022-09-19 ENCOUNTER — Other Ambulatory Visit: Payer: Self-pay | Admitting: Cardiology

## 2022-09-19 DIAGNOSIS — I480 Paroxysmal atrial fibrillation: Secondary | ICD-10-CM

## 2022-09-19 NOTE — Telephone Encounter (Signed)
  Prescription refill request for Xarelto received.  Indication: a fib Last office visit: 01/04/22 Weight: 182 Age: 89 Scr: 1.01 11/20/21 CrCl: 53 ml.min

## 2022-09-25 ENCOUNTER — Ambulatory Visit (INDEPENDENT_AMBULATORY_CARE_PROVIDER_SITE_OTHER): Payer: Medicare Other

## 2022-09-25 DIAGNOSIS — I495 Sick sinus syndrome: Secondary | ICD-10-CM | POA: Diagnosis not present

## 2022-09-25 DIAGNOSIS — I480 Paroxysmal atrial fibrillation: Secondary | ICD-10-CM

## 2022-09-26 LAB — CUP PACEART REMOTE DEVICE CHECK
Battery Remaining Longevity: 72 mo
Battery Remaining Percentage: 58 %
Battery Voltage: 3.01 V
Brady Statistic AP VP Percent: 1 %
Brady Statistic AP VS Percent: 89 %
Brady Statistic AS VP Percent: 1 %
Brady Statistic AS VS Percent: 10 %
Brady Statistic RA Percent Paced: 89 %
Brady Statistic RV Percent Paced: 1 %
Date Time Interrogation Session: 20240828020030
Implantable Lead Connection Status: 753985
Implantable Lead Connection Status: 753985
Implantable Lead Implant Date: 20191105
Implantable Lead Implant Date: 20191105
Implantable Lead Location: 753859
Implantable Lead Location: 753860
Implantable Pulse Generator Implant Date: 20191105
Lead Channel Impedance Value: 390 Ohm
Lead Channel Impedance Value: 410 Ohm
Lead Channel Pacing Threshold Amplitude: 0.375 V
Lead Channel Pacing Threshold Amplitude: 0.875 V
Lead Channel Pacing Threshold Pulse Width: 0.5 ms
Lead Channel Pacing Threshold Pulse Width: 0.5 ms
Lead Channel Sensing Intrinsic Amplitude: 1.7 mV
Lead Channel Sensing Intrinsic Amplitude: 6.6 mV
Lead Channel Setting Pacing Amplitude: 1.125
Lead Channel Setting Pacing Amplitude: 1.375
Lead Channel Setting Pacing Pulse Width: 0.5 ms
Lead Channel Setting Sensing Sensitivity: 2 mV
Pulse Gen Model: 2272
Pulse Gen Serial Number: 9079121

## 2022-10-03 NOTE — Progress Notes (Signed)
Remote pacemaker transmission.   

## 2022-10-07 DIAGNOSIS — M79672 Pain in left foot: Secondary | ICD-10-CM | POA: Diagnosis not present

## 2022-10-07 DIAGNOSIS — M79671 Pain in right foot: Secondary | ICD-10-CM | POA: Diagnosis not present

## 2022-10-07 DIAGNOSIS — I739 Peripheral vascular disease, unspecified: Secondary | ICD-10-CM | POA: Diagnosis not present

## 2022-10-07 DIAGNOSIS — L110 Acquired keratosis follicularis: Secondary | ICD-10-CM | POA: Diagnosis not present

## 2022-11-19 ENCOUNTER — Other Ambulatory Visit: Payer: Self-pay | Admitting: *Deleted

## 2022-11-19 ENCOUNTER — Other Ambulatory Visit: Payer: Self-pay | Admitting: Cardiology

## 2022-11-19 NOTE — Telephone Encounter (Signed)
This is an [place] patient

## 2022-11-28 DIAGNOSIS — I10 Essential (primary) hypertension: Secondary | ICD-10-CM | POA: Diagnosis not present

## 2022-11-28 DIAGNOSIS — K219 Gastro-esophageal reflux disease without esophagitis: Secondary | ICD-10-CM | POA: Diagnosis not present

## 2022-11-28 DIAGNOSIS — I480 Paroxysmal atrial fibrillation: Secondary | ICD-10-CM | POA: Diagnosis not present

## 2022-12-06 DIAGNOSIS — H401113 Primary open-angle glaucoma, right eye, severe stage: Secondary | ICD-10-CM | POA: Diagnosis not present

## 2022-12-17 DIAGNOSIS — L110 Acquired keratosis follicularis: Secondary | ICD-10-CM | POA: Diagnosis not present

## 2022-12-17 DIAGNOSIS — M79672 Pain in left foot: Secondary | ICD-10-CM | POA: Diagnosis not present

## 2022-12-17 DIAGNOSIS — I739 Peripheral vascular disease, unspecified: Secondary | ICD-10-CM | POA: Diagnosis not present

## 2022-12-17 DIAGNOSIS — M79671 Pain in right foot: Secondary | ICD-10-CM | POA: Diagnosis not present

## 2022-12-19 ENCOUNTER — Other Ambulatory Visit: Payer: Self-pay | Admitting: Cardiology

## 2022-12-19 DIAGNOSIS — I480 Paroxysmal atrial fibrillation: Secondary | ICD-10-CM

## 2022-12-19 NOTE — Telephone Encounter (Signed)
Prescription refill request for Xarelto received.  Indication: Afib  Last office visit: 01/04/22 (Mealor)  Weight: 82.8kg Age: 89 Scr: 1.01 (11/16/21)  CrCl: 54.92ml/min  Labs overdue. Pt has scheduled appt with Dr Nelly Laurence on 04/04/23. Note placed on upcoming appt for labs to be drawn at appt. Refill sent to prevent any missed doses.

## 2022-12-21 DIAGNOSIS — Z23 Encounter for immunization: Secondary | ICD-10-CM | POA: Diagnosis not present

## 2022-12-25 ENCOUNTER — Ambulatory Visit (INDEPENDENT_AMBULATORY_CARE_PROVIDER_SITE_OTHER): Payer: Medicare Other

## 2022-12-25 DIAGNOSIS — I495 Sick sinus syndrome: Secondary | ICD-10-CM

## 2022-12-25 LAB — CUP PACEART REMOTE DEVICE CHECK
Battery Remaining Longevity: 68 mo
Battery Remaining Percentage: 55 %
Battery Voltage: 3.01 V
Brady Statistic AP VP Percent: 1 %
Brady Statistic AP VS Percent: 90 %
Brady Statistic AS VP Percent: 1 %
Brady Statistic AS VS Percent: 9.3 %
Brady Statistic RA Percent Paced: 89 %
Brady Statistic RV Percent Paced: 1 %
Date Time Interrogation Session: 20241127020013
Implantable Lead Connection Status: 753985
Implantable Lead Connection Status: 753985
Implantable Lead Implant Date: 20191105
Implantable Lead Implant Date: 20191105
Implantable Lead Location: 753859
Implantable Lead Location: 753860
Implantable Pulse Generator Implant Date: 20191105
Lead Channel Impedance Value: 390 Ohm
Lead Channel Impedance Value: 410 Ohm
Lead Channel Pacing Threshold Amplitude: 0.375 V
Lead Channel Pacing Threshold Amplitude: 0.875 V
Lead Channel Pacing Threshold Pulse Width: 0.5 ms
Lead Channel Pacing Threshold Pulse Width: 0.5 ms
Lead Channel Sensing Intrinsic Amplitude: 1.8 mV
Lead Channel Sensing Intrinsic Amplitude: 6.4 mV
Lead Channel Setting Pacing Amplitude: 1.125
Lead Channel Setting Pacing Amplitude: 1.375
Lead Channel Setting Pacing Pulse Width: 0.5 ms
Lead Channel Setting Sensing Sensitivity: 2 mV
Pulse Gen Model: 2272
Pulse Gen Serial Number: 9079121

## 2023-01-03 ENCOUNTER — Encounter: Payer: Self-pay | Admitting: Cardiovascular Disease

## 2023-01-03 ENCOUNTER — Ambulatory Visit: Payer: Medicare Other | Attending: Cardiovascular Disease | Admitting: Cardiovascular Disease

## 2023-01-03 VITALS — BP 124/72 | HR 61 | Ht 68.0 in | Wt 178.0 lb

## 2023-01-03 DIAGNOSIS — Z79899 Other long term (current) drug therapy: Secondary | ICD-10-CM | POA: Insufficient documentation

## 2023-01-03 DIAGNOSIS — I480 Paroxysmal atrial fibrillation: Secondary | ICD-10-CM | POA: Insufficient documentation

## 2023-01-03 DIAGNOSIS — I10 Essential (primary) hypertension: Secondary | ICD-10-CM | POA: Insufficient documentation

## 2023-01-03 NOTE — Progress Notes (Signed)
    PCP: Juliette Alcide, MD   Primary EP:  Dr Nelly Laurence   [name] is a 89 y.o. male who presents today for routine electrophysiology followup.  Since last being seen in our clinic, the patient reports doing very well.  Today, he denies symptoms of palpitations, chest pain, shortness of breath,  lower extremity edema, dizziness, presyncope, or syncope.  The patient is otherwise without complaint today.   he has no device related complaints -- no new tenderness, drainage, redness.   Physical Exam: Vitals:   01/03/23 1458  BP: 124/72  Pulse: 61  SpO2: 94%  Weight: 178 lb (80.7 kg)  Height: 5\' 8"  (1.727 m)    GEN- The patient is well appearing, alert and oriented x 3 today.   Lungs- Clear to ausculation bilaterally Chest- pacemaker pocket is well healed Heart- Regular rate and rhythm, no murmurs, rubs or gallops Extremities- no clubbing, cyanosis, or edema  Pacemaker interrogation- reviewed in detail today,  See PACEART report  Assessment and Plan:  1. Symptomatic sinus bradycardia  Normal pacemaker function See Pace Art report No changes today he is not device dependant today  2. Paroxysmal atrial fibrillation Afib burden is <1%  Chads2vasc score is is at least 3.  Continue xarelto Bmet, CBC today  3. HTN Stable No change required today  Return in a year  Maurice Small, MD  01/03/2023 3:38 PM

## 2023-01-03 NOTE — Patient Instructions (Signed)
Medication Instructions:   Continue all current medications.   Labwork:  CBC, BMET - orders given today Office will contact with results via phone, letter or mychart.     Testing/Procedures:  none  Follow-Up:  6 months   Any Other Special Instructions Will Be Listed Below (If Applicable).   If you need a refill on your cardiac medications before your next appointment, please call your pharmacy.

## 2023-01-07 DIAGNOSIS — I10 Essential (primary) hypertension: Secondary | ICD-10-CM | POA: Diagnosis not present

## 2023-01-07 DIAGNOSIS — I480 Paroxysmal atrial fibrillation: Secondary | ICD-10-CM | POA: Diagnosis not present

## 2023-01-07 DIAGNOSIS — Z79899 Other long term (current) drug therapy: Secondary | ICD-10-CM | POA: Diagnosis not present

## 2023-01-08 LAB — CUP PACEART INCLINIC DEVICE CHECK
Date Time Interrogation Session: 20241206153908
Implantable Lead Connection Status: 753985
Implantable Lead Connection Status: 753985
Implantable Lead Implant Date: 20191105
Implantable Lead Implant Date: 20191105
Implantable Lead Location: 753859
Implantable Lead Location: 753860
Implantable Pulse Generator Implant Date: 20191105
Pulse Gen Model: 2272
Pulse Gen Serial Number: 9079121

## 2023-01-21 ENCOUNTER — Other Ambulatory Visit: Payer: Self-pay | Admitting: Cardiovascular Disease

## 2023-01-21 ENCOUNTER — Other Ambulatory Visit: Payer: Self-pay

## 2023-01-21 MED ORDER — METOPROLOL TARTRATE 25 MG PO TABS: 12.5000 mg | ORAL_TABLET | Freq: Every day | ORAL | 3 refills | Status: DC

## 2023-02-20 ENCOUNTER — Other Ambulatory Visit: Payer: Self-pay | Admitting: Cardiology

## 2023-02-25 DIAGNOSIS — M79672 Pain in left foot: Secondary | ICD-10-CM | POA: Diagnosis not present

## 2023-02-25 DIAGNOSIS — L110 Acquired keratosis follicularis: Secondary | ICD-10-CM | POA: Diagnosis not present

## 2023-02-25 DIAGNOSIS — M79671 Pain in right foot: Secondary | ICD-10-CM | POA: Diagnosis not present

## 2023-02-25 DIAGNOSIS — I739 Peripheral vascular disease, unspecified: Secondary | ICD-10-CM | POA: Diagnosis not present

## 2023-03-20 ENCOUNTER — Other Ambulatory Visit: Payer: Self-pay | Admitting: Cardiovascular Disease

## 2023-03-20 DIAGNOSIS — I480 Paroxysmal atrial fibrillation: Secondary | ICD-10-CM

## 2023-03-20 NOTE — Telephone Encounter (Signed)
 LM for pt on voice mail regarding dose reduction.  New Rx sent to Pharmacy.

## 2023-03-20 NOTE — Telephone Encounter (Signed)
 Prescription refill request for Xarelto received.  Indication: PAF Last office visit: 01/03/23  A Mealor MD Weight: 80.7kg Age: 89 Scr: 1.12 on 01/08/23  Epic CrCl: 46.03  Pt is currently taking Xarelto 20mg  daily.  Based on above findings Xarelto 15mg  daily would be the appropriate dose.  Message sent to Pharm[facility] to advise on dose reduction.

## 2023-03-26 ENCOUNTER — Ambulatory Visit (INDEPENDENT_AMBULATORY_CARE_PROVIDER_SITE_OTHER): Payer: Medicare Other

## 2023-03-26 DIAGNOSIS — I495 Sick sinus syndrome: Secondary | ICD-10-CM | POA: Diagnosis not present

## 2023-03-27 LAB — CUP PACEART REMOTE DEVICE CHECK
Battery Remaining Longevity: 65 mo
Battery Remaining Percentage: 53 %
Battery Voltage: 2.99 V
Brady Statistic AP VP Percent: 1 %
Brady Statistic AP VS Percent: 91 %
Brady Statistic AS VP Percent: 1 %
Brady Statistic AS VS Percent: 8.9 %
Brady Statistic RA Percent Paced: 90 %
Brady Statistic RV Percent Paced: 1 %
Date Time Interrogation Session: 20250226020013
Implantable Lead Connection Status: 753985
Implantable Lead Connection Status: 753985
Implantable Lead Implant Date: 20191105
Implantable Lead Implant Date: 20191105
Implantable Lead Location: 753859
Implantable Lead Location: 753860
Implantable Pulse Generator Implant Date: 20191105
Lead Channel Impedance Value: 400 Ohm
Lead Channel Impedance Value: 410 Ohm
Lead Channel Pacing Threshold Amplitude: 0.375 V
Lead Channel Pacing Threshold Amplitude: 0.875 V
Lead Channel Pacing Threshold Pulse Width: 0.5 ms
Lead Channel Pacing Threshold Pulse Width: 0.5 ms
Lead Channel Sensing Intrinsic Amplitude: 2.3 mV
Lead Channel Sensing Intrinsic Amplitude: 6.3 mV
Lead Channel Setting Pacing Amplitude: 1.125
Lead Channel Setting Pacing Amplitude: 1.375
Lead Channel Setting Pacing Pulse Width: 0.5 ms
Lead Channel Setting Sensing Sensitivity: 2 mV
Pulse Gen Model: 2272
Pulse Gen Serial Number: 9079121

## 2023-04-02 ENCOUNTER — Encounter: Payer: Self-pay | Admitting: Cardiovascular Disease

## 2023-04-04 ENCOUNTER — Encounter: Payer: Medicare Other | Admitting: Cardiovascular Disease

## 2023-04-30 NOTE — Addendum Note (Signed)
 Addended by: Elease Etienne A on: 04/30/2023 10:14 AM   Modules accepted: Orders

## 2023-04-30 NOTE — Progress Notes (Signed)
 Remote pacemaker transmission.

## 2023-05-13 DIAGNOSIS — M79671 Pain in right foot: Secondary | ICD-10-CM | POA: Diagnosis not present

## 2023-05-13 DIAGNOSIS — I739 Peripheral vascular disease, unspecified: Secondary | ICD-10-CM | POA: Diagnosis not present

## 2023-05-13 DIAGNOSIS — L110 Acquired keratosis follicularis: Secondary | ICD-10-CM | POA: Diagnosis not present

## 2023-05-13 DIAGNOSIS — M79672 Pain in left foot: Secondary | ICD-10-CM | POA: Diagnosis not present

## 2023-06-10 DIAGNOSIS — D23121 Other benign neoplasm of skin of left upper eyelid, including canthus: Secondary | ICD-10-CM | POA: Diagnosis not present

## 2023-06-10 DIAGNOSIS — H0100B Unspecified blepharitis left eye, upper and lower eyelids: Secondary | ICD-10-CM | POA: Diagnosis not present

## 2023-06-10 DIAGNOSIS — H26493 Other secondary cataract, bilateral: Secondary | ICD-10-CM | POA: Diagnosis not present

## 2023-06-10 DIAGNOSIS — H353131 Nonexudative age-related macular degeneration, bilateral, early dry stage: Secondary | ICD-10-CM | POA: Diagnosis not present

## 2023-06-10 DIAGNOSIS — D23122 Other benign neoplasm of skin of left lower eyelid, including canthus: Secondary | ICD-10-CM | POA: Diagnosis not present

## 2023-06-10 DIAGNOSIS — H401113 Primary open-angle glaucoma, right eye, severe stage: Secondary | ICD-10-CM | POA: Diagnosis not present

## 2023-06-10 DIAGNOSIS — H0100A Unspecified blepharitis right eye, upper and lower eyelids: Secondary | ICD-10-CM | POA: Diagnosis not present

## 2023-06-10 DIAGNOSIS — H43813 Vitreous degeneration, bilateral: Secondary | ICD-10-CM | POA: Diagnosis not present

## 2023-06-18 ENCOUNTER — Other Ambulatory Visit: Payer: Self-pay | Admitting: Urology

## 2023-06-18 DIAGNOSIS — N401 Enlarged prostate with lower urinary tract symptoms: Secondary | ICD-10-CM

## 2023-06-25 ENCOUNTER — Ambulatory Visit: Payer: Medicare Other

## 2023-06-25 DIAGNOSIS — I495 Sick sinus syndrome: Secondary | ICD-10-CM

## 2023-06-26 LAB — CUP PACEART REMOTE DEVICE CHECK
Battery Remaining Longevity: 62 mo
Battery Remaining Percentage: 51 %
Battery Voltage: 2.99 V
Brady Statistic AP VP Percent: 1 %
Brady Statistic AP VS Percent: 91 %
Brady Statistic AS VP Percent: 1 %
Brady Statistic AS VS Percent: 8.6 %
Brady Statistic RA Percent Paced: 90 %
Brady Statistic RV Percent Paced: 1 %
Date Time Interrogation Session: 20250528020013
Implantable Lead Connection Status: 753985
Implantable Lead Connection Status: 753985
Implantable Lead Implant Date: 20191105
Implantable Lead Implant Date: 20191105
Implantable Lead Location: 753859
Implantable Lead Location: 753860
Implantable Pulse Generator Implant Date: 20191105
Lead Channel Impedance Value: 400 Ohm
Lead Channel Impedance Value: 410 Ohm
Lead Channel Pacing Threshold Amplitude: 0.375 V
Lead Channel Pacing Threshold Amplitude: 1 V
Lead Channel Pacing Threshold Pulse Width: 0.5 ms
Lead Channel Pacing Threshold Pulse Width: 0.5 ms
Lead Channel Sensing Intrinsic Amplitude: 2.1 mV
Lead Channel Sensing Intrinsic Amplitude: 5.9 mV
Lead Channel Setting Pacing Amplitude: 1.25 V
Lead Channel Setting Pacing Amplitude: 1.375
Lead Channel Setting Pacing Pulse Width: 0.5 ms
Lead Channel Setting Sensing Sensitivity: 2 mV
Pulse Gen Model: 2272
Pulse Gen Serial Number: 9079121

## 2023-07-02 ENCOUNTER — Ambulatory Visit: Payer: Self-pay | Admitting: Cardiovascular Disease

## 2023-07-04 ENCOUNTER — Encounter: Payer: Self-pay | Admitting: Cardiovascular Disease

## 2023-07-04 ENCOUNTER — Ambulatory Visit: Payer: Medicare Other | Attending: Cardiovascular Disease | Admitting: Cardiovascular Disease

## 2023-07-04 VITALS — BP 102/64 | HR 63 | Ht 68.0 in | Wt 171.6 lb

## 2023-07-04 DIAGNOSIS — I480 Paroxysmal atrial fibrillation: Secondary | ICD-10-CM | POA: Diagnosis present

## 2023-07-04 LAB — CUP PACEART INCLINIC DEVICE CHECK
Battery Remaining Longevity: 63 mo
Battery Voltage: 2.99 V
Brady Statistic RA Percent Paced: 90 %
Brady Statistic RV Percent Paced: 0.34 %
Date Time Interrogation Session: 20250606171850
Implantable Lead Connection Status: 753985
Implantable Lead Connection Status: 753985
Implantable Lead Implant Date: 20191105
Implantable Lead Implant Date: 20191105
Implantable Lead Location: 753859
Implantable Lead Location: 753860
Implantable Pulse Generator Implant Date: 20191105
Lead Channel Impedance Value: 387.5 Ohm
Lead Channel Impedance Value: 400 Ohm
Lead Channel Pacing Threshold Amplitude: 0.5 V
Lead Channel Pacing Threshold Amplitude: 0.5 V
Lead Channel Pacing Threshold Amplitude: 1 V
Lead Channel Pacing Threshold Amplitude: 1 V
Lead Channel Pacing Threshold Pulse Width: 0.5 ms
Lead Channel Pacing Threshold Pulse Width: 0.5 ms
Lead Channel Pacing Threshold Pulse Width: 0.5 ms
Lead Channel Pacing Threshold Pulse Width: 0.5 ms
Lead Channel Sensing Intrinsic Amplitude: 2.1 mV
Lead Channel Sensing Intrinsic Amplitude: 6.9 mV
Lead Channel Setting Pacing Amplitude: 1.125
Lead Channel Setting Pacing Amplitude: 1.375
Lead Channel Setting Pacing Pulse Width: 0.5 ms
Lead Channel Setting Sensing Sensitivity: 2 mV
Pulse Gen Model: 2272
Pulse Gen Serial Number: 9079121

## 2023-07-04 NOTE — Patient Instructions (Signed)

## 2023-07-04 NOTE — Progress Notes (Signed)
    PCP: Alston Jerry, MD   Primary EP:  Dr Arlester Ladd   [name] is a 89 y.o. male who presents today for routine electrophysiology followup.  Since last being seen in our clinic, the patient reports doing very well.  Today, he denies symptoms of palpitations, chest pain, shortness of breath,  lower extremity edema, dizziness, presyncope, or syncope.  The patient is otherwise without complaint today.   he has no device related complaints -- no new tenderness, drainage, redness.   Physical Exam: Vitals:   07/04/23 1407  BP: 102/64  Pulse: 63  SpO2: 92%  Weight: 171 lb 9.6 oz (77.8 kg)  Height: 5\' 8"  (1.727 m)     GEN- The patient is well appearing, alert and oriented x 3 today.   Lungs- Clear to ausculation bilaterally Chest- pacemaker pocket is well healed Heart- Regular rate and rhythm, no murmurs, rubs or gallops Extremities- no clubbing, cyanosis, or edema  Pacemaker interrogation- reviewed in detail today,  See PACEART report  Assessment and Plan:  1. Symptomatic sinus bradycardia  Normal pacemaker function See Pace Art report No changes today he is not device dependant today  2. Paroxysmal atrial fibrillation Afib burden is <1%  Chads2vasc score is is at least 3.  Continue xarelto    3. HTN Stable No change required today  Return in a year  Efraim Grange, MD  07/04/2023 2:28 PM

## 2023-07-08 ENCOUNTER — Ambulatory Visit: Payer: Self-pay | Admitting: Cardiovascular Disease

## 2023-07-10 ENCOUNTER — Ambulatory Visit: Payer: Medicare Other | Admitting: Urology

## 2023-07-10 ENCOUNTER — Encounter: Payer: Self-pay | Admitting: Urology

## 2023-07-10 VITALS — BP 109/70 | HR 60

## 2023-07-10 DIAGNOSIS — R3129 Other microscopic hematuria: Secondary | ICD-10-CM

## 2023-07-10 DIAGNOSIS — N401 Enlarged prostate with lower urinary tract symptoms: Secondary | ICD-10-CM

## 2023-07-10 DIAGNOSIS — R350 Frequency of micturition: Secondary | ICD-10-CM | POA: Diagnosis not present

## 2023-07-10 DIAGNOSIS — R351 Nocturia: Secondary | ICD-10-CM

## 2023-07-10 DIAGNOSIS — R339 Retention of urine, unspecified: Secondary | ICD-10-CM

## 2023-07-10 DIAGNOSIS — R338 Other retention of urine: Secondary | ICD-10-CM

## 2023-07-10 LAB — MICROSCOPIC EXAMINATION
Bacteria, UA: NONE SEEN
RBC, Urine: >30 /HPF — AB (ref 0–2)

## 2023-07-10 LAB — URINALYSIS, ROUTINE W REFLEX MICROSCOPIC
Bilirubin, UA: NEGATIVE
Glucose, UA: NEGATIVE
Ketones, UA: NEGATIVE
Leukocytes,UA: NEGATIVE
Nitrite, UA: NEGATIVE
Protein,UA: NEGATIVE
Specific Gravity, UA: 1.02 (ref 1.005–1.030)
Urobilinogen, Ur: 0.2 mg/dL (ref 0.2–1.0)
pH, UA: 6 (ref 5.0–7.5)

## 2023-07-10 NOTE — Progress Notes (Signed)
post void residual=98

## 2023-07-10 NOTE — Progress Notes (Signed)
 Subjective:  1. Benign localized prostatic hyperplasia with lower urinary tract symptoms (LUTS)   2. Incomplete bladder emptying   3. Frequent urination   4. Nocturia   5. Microhematuria     [name] returns today in f/u for his history of BPH with BOO.  He is on finasteride  and alfuzosin . I sent refills on 06/18/23. His UA is clear and his IPSS is 6  with nocturia x 1.  He has had no associated signs or syptoms.  He has had no gross hematuria or flank pain and his PVR is only 91ml but he has >30 RBC's on UA today.  He is on Xarelto  but recently had the dose reduced.   IPSS     Row Name 07/10/23 1400         International Prostate Symptom Score   How often have you had the sensation of not emptying your bladder? Less than 1 in 5     How often have you had to urinate less than every two hours? Less than 1 in 5 times     How often have you found you stopped and started again several times when you urinated? Less than half the time     How often have you found it difficult to postpone urination? Not at All     How often have you had a weak urinary stream? Less than 1 in 5 times     How often have you had to strain to start urination? Not at All     How many times did you typically get up at night to urinate? 1 Time     Total IPSS Score 6       Quality of Life due to urinary symptoms   If you were to spend the rest of your life with your urinary condition just the way it is now how would you feel about that? Pleased           ROS:  ROS:  A complete review of systems was performed.  All systems are negative except for pertinent findings as noted.   Review of Systems  All other systems reviewed and are negative.   Allergies  Allergen Reactions   Flexeril [Cyclobenzaprine Hcl] Nausea And Vomiting   Percocet [Oxycodone-Acetaminophen ] Nausea And Vomiting   Vicodin [Hydrocodone-Acetaminophen ] Nausea And Vomiting   Sulfonamide Derivatives Rash    Outpatient Encounter  Medications as of 07/10/2023  Medication Sig   alfuzosin  (UROXATRAL ) 10 MG 24 hr tablet TAKE 1 TABLET BY MOUTH DAILY WITH BREAKFAST   amLODipine  (NORVASC ) 2.5 MG tablet Take 2.5 mg by mouth daily. Patient takes 1/2 tablet daily   CALCIUM-MAGNESIUM-ZINC PO Take 1 tablet by mouth daily.   cholecalciferol  (VITAMIN D3) 25 MCG (1000 UT) tablet Take 1,000 Units by mouth daily.   dorzolamide  (TRUSOPT ) 2 % ophthalmic solution Place 1 drop into the right eye 2 (two) times daily.   esomeprazole (NEXIUM) 20 MG capsule Take 20 mg by mouth daily.   finasteride  (PROSCAR ) 5 MG tablet TAKE 1 TABLET BY MOUTH DAILY   furosemide  (LASIX ) 40 MG tablet TAKE 1 TABLET BY MOUTH EVERY DAY   latanoprost  (XALATAN ) 0.005 % ophthalmic solution Place 1 drop into both eyes at bedtime.   metoprolol  tartrate (LOPRESSOR ) 25 MG tablet Take 0.5 tablets (12.5 mg total) by mouth daily.   Omega-3 Fatty Acids (OMEGA-3 2100 PO) Take 2,100 mg by mouth daily.   potassium chloride  SA (KLOR-CON  M) 20 MEQ tablet TAKE 1 TABLET BY  MOUTH DAILY   Rivaroxaban  (XARELTO ) 15 MG TABS tablet Take 1 tablet (15 mg total) by mouth daily with supper.   UNABLE TO FIND at bedtime. On CPAP   Facility-Administered Encounter Medications as of 07/10/2023  Medication   sodium chloride  flush (NS) 0.9 % injection 3 mL    Past Medical History:  Diagnosis Date   AAA (abdominal aortic aneurysm) (HCC)    Atrial fibrillation (HCC)    bicarbonate perfusion study 10/11 EF 69%. small defects no ischemia. 7 metastases acheived. ER visit 11/10/09 ruled out MI normal sinus rhythm orthostasis after sublingual nitroglycerin    BPH (benign prostatic hyperplasia)    Chronic back pain    CPAP (continuous positive airway pressure) dependence    GERD (gastroesophageal reflux disease)    Hypertension    Orthostatic hypotension    Pacemaker    PONV (postoperative nausea and vomiting)    Sleep apnea    CPAP at night   Stress fracture of foot    left   Swelling of both  lower extremities     Past Surgical History:  Procedure Laterality Date   CHOLECYSTECTOMY  2012   [facility] by [name]   COLONOSCOPY N/A 08/27/2012   Procedure: COLONOSCOPY;  Surgeon: Michael Corporal, MD;  [facility];  Service: Endoscopy;  Laterality: N/A;  325   COLONOSCOPY N/A 01/12/2018   Procedure: COLONOSCOPY;  Surgeon: Michael Corporal, MD;  [facility]: [facility];  Service: Endoscopy;  Laterality: N/A;  Has staff [name] party at his house at lunch today per office   COLONOSCOPY N/A 07/21/2019   Procedure: COLONOSCOPY;  Surgeon: Michael Corporal, MD;  [facility];  Service: Endoscopy;  Laterality: N/A;   HERNIA REPAIR Bilateral    Inguinal and umbilical   INCISIONAL HERNIA REPAIR N/A 01/31/2014   Procedure: [name] HERNIORRHAPHY WITH MESH;  Surgeon: Michael Bound, MD;  Location: [facility];  Service: General;  Laterality: N/A;   INSERTION OF MESH N/A 01/31/2014   Procedure: INSERTION OF MESH;  Surgeon: Michael Bound, MD;  Location: [facility];  Service: General;  Laterality: N/A;   PACEMAKER IMPLANT N/A 12/02/2017   [employer] MRI conditional  dual-chamber pacemaker by Dr Nunzio Fitzpatrick for symptomatic sinus bradycardia   POLYPECTOMY  01/12/2018   Procedure: POLYPECTOMY;  Surgeon: Michael Corporal, MD;  [facility];  Service: Endoscopy;;  transverse colon polyp, ascending colon polyps x2, hepatic flexure polyps x2    POLYPECTOMY  07/21/2019   Procedure: POLYPECTOMY;  Surgeon: Michael Corporal, MD;  [facility];  Service: Endoscopy;;   SHOULDER SURGERY Right 2005?   for open rotator cuff repair. Outpt [facility]   WOUND DEBRIDEMENT Right 01/31/2014   Procedure: DEBRIDEMENT WOUND RIGHT LEG;  Surgeon: Michael Bound, MD;  Location: [facility];  Service: General;  Laterality: Right;   WRIST FRACTURE SURGERY Right     Social History   Socioeconomic History   Marital status: Divorced    Spouse name: Not on file   Number  of children: Not on file   Years of education: Not on file   Highest education level: Not on file  Occupational History   Not on file  Tobacco Use   Smoking status: Former    Current packs/day: 0.00    Average packs/day: 1 pack/day for 10.3 years (10.3 ttl pk-yrs)    Types: Cigarettes    Start date: 10/15/1943  Quit date: 01/28/1954    Years since quitting: 69.4   Smokeless tobacco: Never   Tobacco comments:    tobacco use - no  Vaping Use   Vaping status: Never Used  Substance and Sexual Activity   Alcohol  use: No    Alcohol /week: 0.0 standard drinks of alcohol    Drug use: No   Sexual activity: Not on file  Other Topics Concern   Not on file  Social History Narrative   Not on file   Social Drivers of Health   Financial Resource Strain: Not on file  Food Insecurity: Not on file  Transportation Needs: Not on file  Physical Activity: Not on file  Stress: Not on file  Social Connections: Not on file  Intimate Partner Violence: Not on file    Family History  Problem Relation Age of Onset   Heart failure Mother    Heart failure Father    Cancer Sister        Objective: Vitals:   07/10/23 1357  BP: 109/70  Pulse: 60      Physical Exam Vitals reviewed.  Constitutional:      Appearance: Normal appearance.   Neurological:     Mental Status: He is alert.     Lab Results:  PSA No results found for: PSA No results found for: TESTOSTERONE   UA has >30 RBC's Results for orders placed or performed in visit on 07/10/23 (from the past 72 hours)  Urinalysis, Routine w reflex microscopic     Status: Abnormal   Collection Time: 07/10/23  1:57 PM  Result Value Ref Range   Specific Gravity, UA 1.020 1.005 - 1.030   pH, UA 6.0 5.0 - 7.5   Color, UA Yellow Yellow   Appearance Ur Clear Clear   Leukocytes,UA Negative Negative   Protein,UA Negative Negative/Trace   Glucose, UA Negative Negative   Ketones, UA Negative Negative   RBC, UA 2+ (A) Negative    Bilirubin, UA Negative Negative   Urobilinogen, Ur 0.2 0.2 - 1.0 mg/dL   Nitrite, UA Negative Negative   Microscopic Examination See below:     Comment: Microscopic was indicated and was performed.  Microscopic Examination     Status: Abnormal   Collection Time: 07/10/23  1:57 PM   Urine  Result Value Ref Range   WBC, UA 0-5 0 - 5 /hpf   RBC, Urine >30 (A) 0 - 2 /hpf   Epithelial Cells (non renal) 0-10 0 - 10 /hpf   Bacteria, UA None seen None seen/Few     Studies/Results: No results found. PVR is 98ml   Assessment & Plan: BPH with BOO.  He is doing well on current therapy.    Microhematuria.  This is a new finding.  I will get a BMP and CT hematuria study.  He will return for cystoscopy.   No orders of the defined types were placed in this encounter.    Orders Placed This Encounter  Procedures   Microscopic Examination   CT HEMATURIA WORKUP    Standing Status:   Future    Expected Date:   07/11/2023    Expiration Date:   07/09/2024    Reason for Exam (SYMPTOM  OR DIAGNOSIS REQUIRED):   microhematuria    Preferred imaging location?:   [facility]    Radiology Contrast Protocol - do NOT remove file path:   [email or web address]   Urinalysis, Routine w reflex microscopic   Basic metabolic panel with GFR   Ambulatory referral  to Urology    Referral Priority:   Routine    Referral Type:   Consultation    Referral Reason:   Patient Preference    Referred to Provider:   Homero Luster, MD    Requested Specialty:   Urology    Number of Visits Requested:   1   Bladder scan   BLADDER SCAN AMB NON-IMAGING      Return in about 1 year (around 07/09/2024) for Next available in [place] with CT results for cystoscopy. .   CC: Burdine, Maceo Sax, MD      Homero Luster 07/11/2023 Patient ID: [name], male   DOB: 08-17-1926, 89 y.o.   MRN: [id number] Patient ID: [name], male   DOB: 12/25/1926, 89 y.o.   MRN: [id number]

## 2023-07-20 ENCOUNTER — Other Ambulatory Visit: Payer: Self-pay | Admitting: Cardiovascular Disease

## 2023-07-22 DIAGNOSIS — M79672 Pain in left foot: Secondary | ICD-10-CM | POA: Diagnosis not present

## 2023-07-22 DIAGNOSIS — L110 Acquired keratosis follicularis: Secondary | ICD-10-CM | POA: Diagnosis not present

## 2023-07-22 DIAGNOSIS — I739 Peripheral vascular disease, unspecified: Secondary | ICD-10-CM | POA: Diagnosis not present

## 2023-07-22 DIAGNOSIS — M79671 Pain in right foot: Secondary | ICD-10-CM | POA: Diagnosis not present

## 2023-07-25 ENCOUNTER — Ambulatory Visit (HOSPITAL_COMMUNITY)
Admission: RE | Admit: 2023-07-25 | Discharge: 2023-07-25 | Disposition: A | Discharge status: Home / Self Care | Source: Ambulatory Visit | Attending: Urology | Admitting: Urology

## 2023-07-25 DIAGNOSIS — R3129 Other microscopic hematuria: Secondary | ICD-10-CM | POA: Insufficient documentation

## 2023-07-25 DIAGNOSIS — K449 Diaphragmatic hernia without obstruction or gangrene: Secondary | ICD-10-CM | POA: Diagnosis not present

## 2023-07-25 DIAGNOSIS — K5730 Diverticulosis of large intestine without perforation or abscess without bleeding: Secondary | ICD-10-CM | POA: Diagnosis not present

## 2023-07-25 DIAGNOSIS — N281 Cyst of kidney, acquired: Secondary | ICD-10-CM | POA: Diagnosis not present

## 2023-07-25 MED ORDER — IOHEXOL 300 MG/ML  SOLN
125.0000 mL | Freq: Once | INTRAMUSCULAR | Status: AC | PRN
  Administered 2023-07-25: 125 mL via INTRAVENOUS

## 2023-07-28 ENCOUNTER — Ambulatory Visit: Payer: Self-pay

## 2023-07-29 LAB — POCT I-STAT CREATININE: Creatinine, Ser: 1.1 mg/dL (ref 0.61–1.24)

## 2023-08-15 NOTE — Progress Notes (Signed)
 Remote pacemaker transmission.

## 2023-08-15 NOTE — Addendum Note (Signed)
 Addended by: VICCI SELLER A on: 08/15/2023 03:03 PM   Modules accepted: Orders

## 2023-09-20 ENCOUNTER — Other Ambulatory Visit: Payer: Self-pay | Admitting: Cardiology

## 2023-09-20 DIAGNOSIS — I480 Paroxysmal atrial fibrillation: Secondary | ICD-10-CM

## 2023-09-24 ENCOUNTER — Ambulatory Visit (INDEPENDENT_AMBULATORY_CARE_PROVIDER_SITE_OTHER): Payer: Medicare Other

## 2023-09-24 DIAGNOSIS — I495 Sick sinus syndrome: Secondary | ICD-10-CM | POA: Diagnosis not present

## 2023-09-24 LAB — CUP PACEART REMOTE DEVICE CHECK
Battery Remaining Longevity: 59 mo
Battery Remaining Percentage: 48 %
Battery Voltage: 2.99 V
Brady Statistic AP VP Percent: 1 %
Brady Statistic AP VS Percent: 92 %
Brady Statistic AS VP Percent: 1 %
Brady Statistic AS VS Percent: 7.3 %
Brady Statistic RA Percent Paced: 91 %
Brady Statistic RV Percent Paced: 1 %
Date Time Interrogation Session: 20250827020014
Implantable Lead Connection Status: 753985
Implantable Lead Connection Status: 753985
Implantable Lead Implant Date: 20191105
Implantable Lead Implant Date: 20191105
Implantable Lead Location: 753859
Implantable Lead Location: 753860
Implantable Pulse Generator Implant Date: 20191105
Lead Channel Impedance Value: 390 Ohm
Lead Channel Impedance Value: 390 Ohm
Lead Channel Pacing Threshold Amplitude: 0.375 V
Lead Channel Pacing Threshold Amplitude: 0.875 V
Lead Channel Pacing Threshold Pulse Width: 0.5 ms
Lead Channel Pacing Threshold Pulse Width: 0.5 ms
Lead Channel Sensing Intrinsic Amplitude: 1.5 mV
Lead Channel Sensing Intrinsic Amplitude: 5.8 mV
Lead Channel Setting Pacing Amplitude: 1.125
Lead Channel Setting Pacing Amplitude: 1.375
Lead Channel Setting Pacing Pulse Width: 0.5 ms
Lead Channel Setting Sensing Sensitivity: 2 mV
Pulse Gen Model: 2272
Pulse Gen Serial Number: 9079121

## 2023-10-02 ENCOUNTER — Ambulatory Visit: Payer: Self-pay | Admitting: Cardiovascular Disease

## 2023-10-11 DIAGNOSIS — I10 Essential (primary) hypertension: Secondary | ICD-10-CM | POA: Diagnosis not present

## 2023-10-11 DIAGNOSIS — I4891 Unspecified atrial fibrillation: Secondary | ICD-10-CM | POA: Diagnosis not present

## 2023-10-11 DIAGNOSIS — K5730 Diverticulosis of large intestine without perforation or abscess without bleeding: Secondary | ICD-10-CM | POA: Diagnosis not present

## 2023-10-11 DIAGNOSIS — I7143 Infrarenal abdominal aortic aneurysm, without rupture: Secondary | ICD-10-CM | POA: Diagnosis not present

## 2023-10-11 DIAGNOSIS — R188 Other ascites: Secondary | ICD-10-CM | POA: Diagnosis not present

## 2023-10-11 DIAGNOSIS — K219 Gastro-esophageal reflux disease without esophagitis: Secondary | ICD-10-CM | POA: Diagnosis not present

## 2023-10-11 DIAGNOSIS — M549 Dorsalgia, unspecified: Secondary | ICD-10-CM | POA: Diagnosis not present

## 2023-10-11 DIAGNOSIS — M47816 Spondylosis without myelopathy or radiculopathy, lumbar region: Secondary | ICD-10-CM | POA: Diagnosis not present

## 2023-10-11 DIAGNOSIS — M545 Low back pain, unspecified: Secondary | ICD-10-CM | POA: Diagnosis not present

## 2023-10-11 DIAGNOSIS — M7918 Myalgia, other site: Secondary | ICD-10-CM | POA: Diagnosis not present

## 2023-10-11 DIAGNOSIS — I2510 Atherosclerotic heart disease of native coronary artery without angina pectoris: Secondary | ICD-10-CM | POA: Diagnosis not present

## 2023-10-13 NOTE — Progress Notes (Signed)
 Remote PPM Transmission

## 2023-10-19 DIAGNOSIS — Z792 Long term (current) use of antibiotics: Secondary | ICD-10-CM | POA: Diagnosis not present

## 2023-10-19 DIAGNOSIS — M549 Dorsalgia, unspecified: Secondary | ICD-10-CM | POA: Diagnosis not present

## 2023-10-19 DIAGNOSIS — Z888 Allergy status to other drugs, medicaments and biological substances status: Secondary | ICD-10-CM | POA: Diagnosis not present

## 2023-10-19 DIAGNOSIS — Z886 Allergy status to analgesic agent status: Secondary | ICD-10-CM | POA: Diagnosis not present

## 2023-10-19 DIAGNOSIS — I10 Essential (primary) hypertension: Secondary | ICD-10-CM | POA: Diagnosis not present

## 2023-10-19 DIAGNOSIS — S32010A Wedge compression fracture of first lumbar vertebra, initial encounter for closed fracture: Secondary | ICD-10-CM | POA: Diagnosis not present

## 2023-10-19 DIAGNOSIS — Z7901 Long term (current) use of anticoagulants: Secondary | ICD-10-CM | POA: Diagnosis not present

## 2023-10-19 DIAGNOSIS — I714 Abdominal aortic aneurysm, without rupture, unspecified: Secondary | ICD-10-CM | POA: Diagnosis not present

## 2023-10-19 DIAGNOSIS — J189 Pneumonia, unspecified organism: Secondary | ICD-10-CM | POA: Diagnosis not present

## 2023-10-19 DIAGNOSIS — R0602 Shortness of breath: Secondary | ICD-10-CM | POA: Diagnosis not present

## 2023-10-19 DIAGNOSIS — Z950 Presence of cardiac pacemaker: Secondary | ICD-10-CM | POA: Diagnosis not present

## 2023-10-19 DIAGNOSIS — J690 Pneumonitis due to inhalation of food and vomit: Secondary | ICD-10-CM | POA: Diagnosis not present

## 2023-10-19 DIAGNOSIS — Z885 Allergy status to narcotic agent status: Secondary | ICD-10-CM | POA: Diagnosis not present

## 2023-10-19 DIAGNOSIS — Z87891 Personal history of nicotine dependence: Secondary | ICD-10-CM | POA: Diagnosis not present

## 2023-10-19 DIAGNOSIS — Z79899 Other long term (current) drug therapy: Secondary | ICD-10-CM | POA: Diagnosis not present

## 2023-10-19 DIAGNOSIS — R918 Other nonspecific abnormal finding of lung field: Secondary | ICD-10-CM | POA: Diagnosis not present

## 2023-10-19 DIAGNOSIS — R0609 Other forms of dyspnea: Secondary | ICD-10-CM | POA: Diagnosis not present

## 2023-10-19 DIAGNOSIS — R059 Cough, unspecified: Secondary | ICD-10-CM | POA: Diagnosis not present

## 2023-10-19 DIAGNOSIS — Z882 Allergy status to sulfonamides status: Secondary | ICD-10-CM | POA: Diagnosis not present

## 2023-10-19 DIAGNOSIS — D72829 Elevated white blood cell count, unspecified: Secondary | ICD-10-CM | POA: Diagnosis not present

## 2023-10-19 DIAGNOSIS — I2510 Atherosclerotic heart disease of native coronary artery without angina pectoris: Secondary | ICD-10-CM | POA: Diagnosis not present

## 2023-10-19 DIAGNOSIS — Z66 Do not resuscitate: Secondary | ICD-10-CM | POA: Diagnosis not present

## 2023-10-19 DIAGNOSIS — W19XXXA Unspecified fall, initial encounter: Secondary | ICD-10-CM | POA: Diagnosis not present

## 2023-10-19 DIAGNOSIS — Z1152 Encounter for screening for COVID-19: Secondary | ICD-10-CM | POA: Diagnosis not present

## 2023-10-19 DIAGNOSIS — Z9981 Dependence on supplemental oxygen: Secondary | ICD-10-CM | POA: Diagnosis not present

## 2023-10-19 DIAGNOSIS — I4891 Unspecified atrial fibrillation: Secondary | ICD-10-CM | POA: Diagnosis not present

## 2023-10-19 DIAGNOSIS — J90 Pleural effusion, not elsewhere classified: Secondary | ICD-10-CM | POA: Diagnosis not present

## 2023-10-19 DIAGNOSIS — Z20822 Contact with and (suspected) exposure to covid-19: Secondary | ICD-10-CM | POA: Diagnosis not present

## 2023-10-19 DIAGNOSIS — R0902 Hypoxemia: Secondary | ICD-10-CM | POA: Diagnosis not present

## 2023-10-19 DIAGNOSIS — K449 Diaphragmatic hernia without obstruction or gangrene: Secondary | ICD-10-CM | POA: Diagnosis not present

## 2023-10-19 DIAGNOSIS — S32019A Unspecified fracture of first lumbar vertebra, initial encounter for closed fracture: Secondary | ICD-10-CM | POA: Diagnosis not present

## 2023-10-22 ENCOUNTER — Ambulatory Visit: Admitting: Surgical

## 2023-10-22 NOTE — Progress Notes (Signed)
 [facility] INFECTIOUS DISEASES CONSULT SERVICE SIGN OFF    Hoyte Ziebell is being evaluated in consultation at the request of [facility] for evaluation and management of possible aspiration pneumonia.    ASSESSMENT:   # Aspiration pneumonia [name] presented with lower respiratory symptoms and was found to have leukocytosis and infiltrate on CXR consistent with pneumonia.  He has been lying in bed for the past week following a fall and lumbar wedge fracture, which may have increased his risk for aspiration.  No obvious aspiration on bedside swallow, but patient/family declining barium swallow study. Primary team was concerned about lack of improvement after 24 hours of antibiotics, but this is not unusual especially if his aspiration event was recent or ongoing.  The fact that CXR infiltrate worsened from 9/21 to 9/22 suggest to me that this pneumonia was caught early and CXR and WBC were catching up rather than signs of clinical worsening or antibiotic failure.  Chest CT on 9/23 shows a posterior right lower lobe pneumonia, suggestive of aspiration as had been clinically suspected.  He now appears to be clinically improving with improving leukocytosis and stable oxygen  requirements.  Typical duration of treatment for community-acquired aspiration pneumonia is 5 days total, but I think it is reasonable to offer a slightly longer course here given his degree of illness.    RECOMMENDATIONS FOR 10/22/2023  Diagnostic If fever develops, repeat blood cultures  Monitor for toxicity of antibiotics with CBC and CMP  Treatment Has completed azithromycin 500mg  daily x3 days  Continue ceftriaxone 1g daily  Reasonable to continue metronidazole ; this can be dosed 500 mg every 12 hours p.o. When patient is ready for discharge, can transition to amoxicillin-clavulanate 875 mg every 12 hours to complete a total 7-day course of antibiotics (end date 9/27) Please  counsel patient to obtain influenza vaccine this season    Thank you for involving us  in the care of this patient. Our service will sign off.     _____________________________________   Disclaimer: Please note that the above dictation was performed using Dragon dictation software. Inaccuracies may be present due to transcription errors, which does not reflect care given to the patient. For any questions or clarification please contact the me directly.   Total time reviewing records, charting, coordination of care, and including a written report to the treating provider via electronic health record regarding the condition of this patient.: 20 minutes The recommendations provided in this eConsult are based on the clinical data available to me and are furnished without the benefit of a comprehensive in-person evaluation of the patient. Any new clinical issues or changes in patient status not available to me will need to be taken into account when assessing these recommendations. The ongoing management of this patient is the responsibility of the referring clinician. Please contact me if you have further questions.   I was outside the hospital.  The patient and/or parent/guardian has been advised of the potential risks and limitations of this mode of treatment (including, but not limited to, the absence of in-person examination) and has agreed to be treated using telemedicine. The  Patient's, patient's guardian's and/or patient's family's questions regarding telemedicine have been answered.      Medical decision-making for October 22, 2023    Patient has: [x]  acute illness w/systemic sxs  [mod] []  illness posing risk to life or function  [high] Probs At least 2:  Probs, Data, Risk  I reviewed: [x]  primary team note []   consultant note(s) []  procedure/op note(s) [x]  micro result(s) >=3 Data Review (2 of 3)    [x]  CBC results [x]  chemistry results [x]  radiology report(s) []  w/indep. historian      I independently interpreted: []  cultures in lab []  plain film images [x]  CT images []  PET images Any     []  path slide(s) []  ECG tracing []  MRI images []  nuclear scan     I discussed: []  micro and/or path w/lab personnel []  drug options and/or interactions w/ID pharmD Any     []  procedure/OR findings w/other MD(s) []  echo and/or imaging w/other MD(s)      [x]  mgm't w/attending(s) involved in case []  setting up home abx w/OPAT team     Mgm't requires: [x]  prescription drug(s)  [mod] [x]  intensive toxicity monitoring  [high] Risk      Initial Consult Documentation   History of Present Illness: Sources of information include: chart review, patient, and family/friend(s).  89 y.o. male with AAA, Afib on Xarelto , PPM, peripheral edema, GERD, and recent fall complicated by L1 wedge fracture and neck skin tear 1 week prior who presented to the ED 9/21 with dyspnea, cough, and congestion lasting 2 days and associated with decreased appetite.    On presentation, he was afebrile, HR 90s-100s, BP 110s-120s systolic, satting 92% on RA.  Labs notable for WBC 14.5 (up from 8.5 on 9/13), Cr 0.86, lactate 1.6. Negative RVP, urin legionella antigen test. Blood culture negative  CXR with patchy opacity right medial lung base, which became more prominent on 9/22 repeat Xray  He was started on azithromycin and ceftriaxone.   9/22 - Remained afebrile. WBC 20.7. Endorsed still feeling poorly.   Underwent swallow study at bedside without obvious aspiration.  Family declined barium swallow study but agreed to modified diet to reduce aspiration risk.    Today he remains afebrile, WBC down to 14.3.   When I spoke with Mr. Mcglothen and his partner, they stated he developed a cough on his birthday that progressively worsened.  No N/V/D, no fevers or chills, no dyspnea.  No known sick contacts.  He was ambulating at home normally with walker and would eat and drink sitting up.  No known aspiration event.     Interval history 9/24: -CT chest obtained yesterday showing posterior right lower lobe pneumonia - Afebrile overnight -Stable O2 requirement on 1.5 L - WBC count downtrending to 12.1 today - sputum culture on admission with OP flora   Allergies: Acetaminophen , Cyclobenzaprine, Hydrocodone-acetaminophen , Oxycodone-acetaminophen , and Sulfamethoxazole   Review of Systems As per HPI. All others negative.   Past Medical History He has a past medical history of AAA (abdominal aortic aneurysm), Atrial fibrillation    (CMS-HCC), Coronary artery disease, GERD (gastroesophageal reflux disease), Hypertension, and Pacemaker.  Past Surgical History Past Surgical History[1]   Meds and Allergies He has a current medication list which includes the following prescription(s): alfuzosin , amlodipine , calcitonin (salmon), calcium/magnesium/zinc, cholecalciferol  (vitamin d3), dorzolamide -timolol, esomeprazole, finasteride , fish oil-omega-3 fatty acids, furosemide , latanoprost , metoprolol  tartrate, potassium chloride , and xarelto , and the following Facility-Administered Medications: amlodipine , benzonatate, ceftriaxone in dextrose  (premix), dorzolamide -timolol, furosemide , latanoprost , metoprolol  tartrate, metronidazole , rivaroxaban , and tamsulosin.       Social History Smoked years ago from teenager; smoked about 30 years  No hx asthma or COPD CPAP at night  No heavy alcohol  history     Family History His family history is not on file.   Antimicrobials  ( )   Current Azithromycin, ceftriaxone, and metronidazole   Previous -  None  Current/prior immunomodulators - None   Current Medications as of 10/22/2023 Scheduled  PRN  amlodipine , 2.5 mg, Daily cefTRIAXone, 1 g, Q24H dorzolamide -timolol, 1 drop, BID furosemide , 40 mg, Daily latanoprost , 1 drop, Nightly metoPROLOL  tartrate, 12.5 mg, Daily metroNIDAZOLE , 500 mg, Q8H SCH rivaroxaban , 15 mg, Daily tamsulosin, 0.4  mg, Nightly    benzonatate, 100 mg, TID PRN      Physical Exam Temp:  [36.7 C (98.1 F)] 36.7 C (98.1 F) Pulse:  [62-132] 132 Resp:  [14-17] 14 BP: (107-122)/(70-85) 107/85 MAP (mmHg):  [86-93] 93 FiO2 (%):  [26 %] 26 % SpO2:  [93 %-95 %] 93 %  Actual body weight: 73.7 kg (162 lb 8 oz)  Ideal body weight: 70.7 kg (155 lb 13.8 oz) Adjusted ideal body weight: 71.9 kg (158 lb 8.3 oz)     Patient Lines/Drains/Airways Status     Active Active Lines, Drains, & Airways     Name Placement date Placement time Site Days   Peripheral IV 10/20/23 Anterior;Right Forearm 10/20/23  2100  Forearm  1            Data for Medical Decision Making  ( IDGENCONMDM )    Recent Labs    Units 10/19/23 1056 10/20/23 0355 10/21/23 0450 10/22/23 0517  WBC 10*9/L 14.5* 20.7* 14.3* 12.1*  HGB g/dL 87.4 88.5* 89.3* 88.8*  PLT 10*9/L 169 157 165 198  NEUTROABS 10*9/L 12.6* 17.8* 12.0* 9.9*  LYMPHSABS 10*9/L 0.7 1.3 1.1 1.1  EOSABS 10*9/L 0.0 0.0 0.1 0.1  CREATININE mg/dL 9.13 9.24* 9.15 9.25*  AST U/L 14*  --   --   --   ALT U/L 12  --   --   --   BILITOT mg/dL 1.1  --   --   --   K mmol/L 4.2 4.2 4.0 4.1  MG mg/dL  --  1.6  --   --   CALCIUM mg/dL 9.3 8.6 8.8 8.8    New Culture Data  [facility] )  Microbiology Results (last day)     Procedure Component Value Date/Time Date/Time   Sputum Culture [[id number]] Collected: 10/19/23 1126   Lab Status: Preliminary result Specimen: Sputum Induced Updated: 10/20/23 1335    Lower Respiratory Culture TOO YOUNG TO READ    Gram Stain <10  Epithelial cells/LPF     >25 PMNS/LPF     Smear Results Suggest Mixed oral flora     Acceptable for culture   Narrative:     Specimen Source: Sputum Induced   Blood Culture [[id number]]  (Normal) Collected: 10/19/23 1056   Lab Status: Preliminary result Specimen: Blood from 1 Peripheral Draw Updated: 10/20/23 1100    Blood Culture, Routine No Growth at 24 hours   Blood Culture [[id number]]   (Normal) Collected: 10/19/23 1056   Lab Status: Preliminary result Specimen: Blood from 1 Peripheral Draw Updated: 10/20/23 1100    Blood Culture, Routine No Growth at 24 hours          Relevant Historical Micro (Date - Source - Results)   ETTER MOMENT  /  00CXRESULT  /  00CXSUSC )    Recent Studies  ( RISRSLT )  CT Chest W Contrast Result Date: 10/21/2023 Exam: CT of the Chest with Contrast  History: Right hilar prominence on chest x-ray.  Former smoker.  Technique: After administration of intravenous contrast, contiguous axial images were obtained through the chest. Multiplanar and maximum intensity projection axial reformats were obtained. AEC (automated exposure control) and/or manual  techniques such as size-specific kV and mAs are employed where appropriate to reduce radiation exposure for all CT exams.  Comparison: Chest x-ray 10/20/2023.    Findings: LUNGS: There is right lower lobe pneumonia.  No focal masses seen.  There are no suspicious pulmonary nodules.  PLEURA: There are small bilateral pleural effusions.  There is no pneumothorax.  HEART: There is a left-sided pacemaker.  Normal heart size. No pericardial effusion. No significant coronary artery calcifications.  GREAT VESSELS: Thoracic aorta unremarkable. No central pulmonary embolism.   MEDIASTINUM: No lymphadenopathy.  There is a moderate hiatal hernia.  BONES: No aggressive lesion. No acute abnormality.  SOFT TISSUES: Normal.  UPPER ABDOMEN: Known abdominal aortic aneurysm is partially visualized.  Gallbladder is surgically absent.    1.    Right lower lobe pneumonia. 2.    Small bilateral pleural effusions. 3.    Moderate hiatal hernia. 4.    Known abdominal aortic aneurysm is partially visualized.  Signed (Electronic Signature): 10/21/2023 1:00 PM Signed By: Thresa DELENA Goldmann, MD    Relevant Historical Studies  ( RISRSLTADM - right click > make text editable > delete PRN )  CT Chest W Contrast Result Date: 10/21/2023 Exam: CT  of the Chest with Contrast  History: Right hilar prominence on chest x-ray.  Former smoker.  Technique: After administration of intravenous contrast, contiguous axial images were obtained through the chest. Multiplanar and maximum intensity projection axial reformats were obtained. AEC (automated exposure control) and/or manual techniques such as size-specific kV and mAs are employed where appropriate to reduce radiation exposure for all CT exams.  Comparison: Chest x-ray 10/20/2023.    Findings: LUNGS: There is right lower lobe pneumonia.  No focal masses seen.  There are no suspicious pulmonary nodules.  PLEURA: There are small bilateral pleural effusions.  There is no pneumothorax.  HEART: There is a left-sided pacemaker.  Normal heart size. No pericardial effusion. No significant coronary artery calcifications.  GREAT VESSELS: Thoracic aorta unremarkable. No central pulmonary embolism.   MEDIASTINUM: No lymphadenopathy.  There is a moderate hiatal hernia.  BONES: No aggressive lesion. No acute abnormality.  SOFT TISSUES: Normal.  UPPER ABDOMEN: Known abdominal aortic aneurysm is partially visualized.  Gallbladder is surgically absent.    1.    Right lower lobe pneumonia. 2.    Small bilateral pleural effusions. 3.    Moderate hiatal hernia. 4.    Known abdominal aortic aneurysm is partially visualized.  Signed (Electronic Signature): 10/21/2023 1:00 PM Signed By: Thresa DELENA Goldmann, MD  XR Chest Portable Result Date: 10/20/2023 Exam:  Portable Chest  History:  Pneumonia. Elevated WBC.  Technique:  Single frontal view.  Comparison:  10/19/2023.  Findings: Stable left-sided bipolar pacer. Stable cardiac mediastinal silhouette. Prominent soft tissue about the right hilum and right lower lobe. Minimal opacity in the left lung base. No effusions or pneumothorax.    1. Right hilar prominence and opacity in the right lower lobe. Although this could represent pneumonia, recommend chest CT to evaluate for right hilar  mass.    Signed (Electronic Signature): 10/20/2023 4:48 PM Signed By: Lindy Harriette RADDLE, MD  XR Chest Portable Result Date: 10/19/2023 Exam:  Portable Chest  History:  89 year old male with cough.  Technique:  Single frontal view.  Comparison:  Chest radiograph 11/10/2009.  FINDINGS:  Left chest wall pacer device in place.  Normal lung volumes. Patchy opacity in the medial right lung base.  No pleural effusion or pneumothorax.  Cardiomediastinal silhouette is unremarkable.  No acute osseous abnormality.     Patchy opacity in the medial right lung base may represent pneumonia.  Signed (Electronic Signature): 10/19/2023 10:42 AM Signed By: Odella Skiff, MD           [1] No past surgical history on file.

## 2023-10-25 DIAGNOSIS — I4891 Unspecified atrial fibrillation: Secondary | ICD-10-CM | POA: Diagnosis not present

## 2023-10-25 DIAGNOSIS — I714 Abdominal aortic aneurysm, without rupture, unspecified: Secondary | ICD-10-CM | POA: Diagnosis not present

## 2023-10-25 DIAGNOSIS — S32010D Wedge compression fracture of first lumbar vertebra, subsequent encounter for fracture with routine healing: Secondary | ICD-10-CM | POA: Diagnosis not present

## 2023-10-25 DIAGNOSIS — I2510 Atherosclerotic heart disease of native coronary artery without angina pectoris: Secondary | ICD-10-CM | POA: Diagnosis not present

## 2023-10-25 DIAGNOSIS — K219 Gastro-esophageal reflux disease without esophagitis: Secondary | ICD-10-CM | POA: Diagnosis not present

## 2023-10-25 DIAGNOSIS — J188 Other pneumonia, unspecified organism: Secondary | ICD-10-CM | POA: Diagnosis not present

## 2023-10-25 DIAGNOSIS — Z950 Presence of cardiac pacemaker: Secondary | ICD-10-CM | POA: Diagnosis not present

## 2023-10-25 DIAGNOSIS — I10 Essential (primary) hypertension: Secondary | ICD-10-CM | POA: Diagnosis not present

## 2023-10-25 DIAGNOSIS — R600 Localized edema: Secondary | ICD-10-CM | POA: Diagnosis not present

## 2023-10-25 DIAGNOSIS — J690 Pneumonitis due to inhalation of food and vomit: Secondary | ICD-10-CM | POA: Diagnosis not present

## 2023-10-25 DIAGNOSIS — D72829 Elevated white blood cell count, unspecified: Secondary | ICD-10-CM | POA: Diagnosis not present

## 2023-10-25 DIAGNOSIS — Z7901 Long term (current) use of anticoagulants: Secondary | ICD-10-CM | POA: Diagnosis not present

## 2023-10-25 DIAGNOSIS — Z9181 History of falling: Secondary | ICD-10-CM | POA: Diagnosis not present

## 2023-10-27 DIAGNOSIS — D72829 Elevated white blood cell count, unspecified: Secondary | ICD-10-CM | POA: Diagnosis not present

## 2023-10-28 DIAGNOSIS — I714 Abdominal aortic aneurysm, without rupture, unspecified: Secondary | ICD-10-CM | POA: Diagnosis not present

## 2023-10-28 DIAGNOSIS — I4891 Unspecified atrial fibrillation: Secondary | ICD-10-CM | POA: Diagnosis not present

## 2023-10-28 DIAGNOSIS — S32010D Wedge compression fracture of first lumbar vertebra, subsequent encounter for fracture with routine healing: Secondary | ICD-10-CM | POA: Diagnosis not present

## 2023-10-28 DIAGNOSIS — J690 Pneumonitis due to inhalation of food and vomit: Secondary | ICD-10-CM | POA: Diagnosis not present

## 2023-10-28 DIAGNOSIS — J188 Other pneumonia, unspecified organism: Secondary | ICD-10-CM | POA: Diagnosis not present

## 2023-10-28 DIAGNOSIS — D72829 Elevated white blood cell count, unspecified: Secondary | ICD-10-CM | POA: Diagnosis not present

## 2023-10-30 DIAGNOSIS — I714 Abdominal aortic aneurysm, without rupture, unspecified: Secondary | ICD-10-CM | POA: Diagnosis not present

## 2023-10-30 DIAGNOSIS — J690 Pneumonitis due to inhalation of food and vomit: Secondary | ICD-10-CM | POA: Diagnosis not present

## 2023-10-30 DIAGNOSIS — J188 Other pneumonia, unspecified organism: Secondary | ICD-10-CM | POA: Diagnosis not present

## 2023-10-30 DIAGNOSIS — S32010D Wedge compression fracture of first lumbar vertebra, subsequent encounter for fracture with routine healing: Secondary | ICD-10-CM | POA: Diagnosis not present

## 2023-10-30 DIAGNOSIS — D72829 Elevated white blood cell count, unspecified: Secondary | ICD-10-CM | POA: Diagnosis not present

## 2023-10-30 DIAGNOSIS — I4891 Unspecified atrial fibrillation: Secondary | ICD-10-CM | POA: Diagnosis not present

## 2023-10-31 DIAGNOSIS — D72829 Elevated white blood cell count, unspecified: Secondary | ICD-10-CM | POA: Diagnosis not present

## 2023-10-31 DIAGNOSIS — S32010D Wedge compression fracture of first lumbar vertebra, subsequent encounter for fracture with routine healing: Secondary | ICD-10-CM | POA: Diagnosis not present

## 2023-10-31 DIAGNOSIS — I714 Abdominal aortic aneurysm, without rupture, unspecified: Secondary | ICD-10-CM | POA: Diagnosis not present

## 2023-10-31 DIAGNOSIS — I4891 Unspecified atrial fibrillation: Secondary | ICD-10-CM | POA: Diagnosis not present

## 2023-10-31 DIAGNOSIS — J690 Pneumonitis due to inhalation of food and vomit: Secondary | ICD-10-CM | POA: Diagnosis not present

## 2023-10-31 DIAGNOSIS — J188 Other pneumonia, unspecified organism: Secondary | ICD-10-CM | POA: Diagnosis not present

## 2023-11-04 DIAGNOSIS — J690 Pneumonitis due to inhalation of food and vomit: Secondary | ICD-10-CM | POA: Diagnosis not present

## 2023-11-04 DIAGNOSIS — I714 Abdominal aortic aneurysm, without rupture, unspecified: Secondary | ICD-10-CM | POA: Diagnosis not present

## 2023-11-04 DIAGNOSIS — D72829 Elevated white blood cell count, unspecified: Secondary | ICD-10-CM | POA: Diagnosis not present

## 2023-11-04 DIAGNOSIS — I4891 Unspecified atrial fibrillation: Secondary | ICD-10-CM | POA: Diagnosis not present

## 2023-11-04 DIAGNOSIS — J188 Other pneumonia, unspecified organism: Secondary | ICD-10-CM | POA: Diagnosis not present

## 2023-11-04 DIAGNOSIS — S32010D Wedge compression fracture of first lumbar vertebra, subsequent encounter for fracture with routine healing: Secondary | ICD-10-CM | POA: Diagnosis not present

## 2023-11-05 DIAGNOSIS — J690 Pneumonitis due to inhalation of food and vomit: Secondary | ICD-10-CM | POA: Diagnosis not present

## 2023-11-05 DIAGNOSIS — J188 Other pneumonia, unspecified organism: Secondary | ICD-10-CM | POA: Diagnosis not present

## 2023-11-05 DIAGNOSIS — D72829 Elevated white blood cell count, unspecified: Secondary | ICD-10-CM | POA: Diagnosis not present

## 2023-11-05 DIAGNOSIS — S32010D Wedge compression fracture of first lumbar vertebra, subsequent encounter for fracture with routine healing: Secondary | ICD-10-CM | POA: Diagnosis not present

## 2023-11-05 DIAGNOSIS — I714 Abdominal aortic aneurysm, without rupture, unspecified: Secondary | ICD-10-CM | POA: Diagnosis not present

## 2023-11-05 DIAGNOSIS — I4891 Unspecified atrial fibrillation: Secondary | ICD-10-CM | POA: Diagnosis not present

## 2023-11-06 DIAGNOSIS — D72829 Elevated white blood cell count, unspecified: Secondary | ICD-10-CM | POA: Diagnosis not present

## 2023-11-06 DIAGNOSIS — S32010D Wedge compression fracture of first lumbar vertebra, subsequent encounter for fracture with routine healing: Secondary | ICD-10-CM | POA: Diagnosis not present

## 2023-11-06 DIAGNOSIS — J690 Pneumonitis due to inhalation of food and vomit: Secondary | ICD-10-CM | POA: Diagnosis not present

## 2023-11-06 DIAGNOSIS — I714 Abdominal aortic aneurysm, without rupture, unspecified: Secondary | ICD-10-CM | POA: Diagnosis not present

## 2023-11-06 DIAGNOSIS — I4891 Unspecified atrial fibrillation: Secondary | ICD-10-CM | POA: Diagnosis not present

## 2023-11-06 DIAGNOSIS — J188 Other pneumonia, unspecified organism: Secondary | ICD-10-CM | POA: Diagnosis not present

## 2023-11-07 DIAGNOSIS — D72829 Elevated white blood cell count, unspecified: Secondary | ICD-10-CM | POA: Diagnosis not present

## 2023-11-07 DIAGNOSIS — J188 Other pneumonia, unspecified organism: Secondary | ICD-10-CM | POA: Diagnosis not present

## 2023-11-07 DIAGNOSIS — I714 Abdominal aortic aneurysm, without rupture, unspecified: Secondary | ICD-10-CM | POA: Diagnosis not present

## 2023-11-07 DIAGNOSIS — J690 Pneumonitis due to inhalation of food and vomit: Secondary | ICD-10-CM | POA: Diagnosis not present

## 2023-11-07 DIAGNOSIS — S32010D Wedge compression fracture of first lumbar vertebra, subsequent encounter for fracture with routine healing: Secondary | ICD-10-CM | POA: Diagnosis not present

## 2023-11-07 DIAGNOSIS — I4891 Unspecified atrial fibrillation: Secondary | ICD-10-CM | POA: Diagnosis not present

## 2023-11-10 DIAGNOSIS — S32010D Wedge compression fracture of first lumbar vertebra, subsequent encounter for fracture with routine healing: Secondary | ICD-10-CM | POA: Diagnosis not present

## 2023-11-10 DIAGNOSIS — I4891 Unspecified atrial fibrillation: Secondary | ICD-10-CM | POA: Diagnosis not present

## 2023-11-10 DIAGNOSIS — D72829 Elevated white blood cell count, unspecified: Secondary | ICD-10-CM | POA: Diagnosis not present

## 2023-11-10 DIAGNOSIS — J690 Pneumonitis due to inhalation of food and vomit: Secondary | ICD-10-CM | POA: Diagnosis not present

## 2023-11-10 DIAGNOSIS — I714 Abdominal aortic aneurysm, without rupture, unspecified: Secondary | ICD-10-CM | POA: Diagnosis not present

## 2023-11-10 DIAGNOSIS — J188 Other pneumonia, unspecified organism: Secondary | ICD-10-CM | POA: Diagnosis not present

## 2023-11-11 DIAGNOSIS — I714 Abdominal aortic aneurysm, without rupture, unspecified: Secondary | ICD-10-CM | POA: Diagnosis not present

## 2023-11-11 DIAGNOSIS — S32010D Wedge compression fracture of first lumbar vertebra, subsequent encounter for fracture with routine healing: Secondary | ICD-10-CM | POA: Diagnosis not present

## 2023-11-11 DIAGNOSIS — J188 Other pneumonia, unspecified organism: Secondary | ICD-10-CM | POA: Diagnosis not present

## 2023-11-11 DIAGNOSIS — D72829 Elevated white blood cell count, unspecified: Secondary | ICD-10-CM | POA: Diagnosis not present

## 2023-11-11 DIAGNOSIS — J690 Pneumonitis due to inhalation of food and vomit: Secondary | ICD-10-CM | POA: Diagnosis not present

## 2023-11-11 DIAGNOSIS — I4891 Unspecified atrial fibrillation: Secondary | ICD-10-CM | POA: Diagnosis not present

## 2023-11-12 DIAGNOSIS — J188 Other pneumonia, unspecified organism: Secondary | ICD-10-CM | POA: Diagnosis not present

## 2023-11-12 DIAGNOSIS — D72829 Elevated white blood cell count, unspecified: Secondary | ICD-10-CM | POA: Diagnosis not present

## 2023-11-12 DIAGNOSIS — J690 Pneumonitis due to inhalation of food and vomit: Secondary | ICD-10-CM | POA: Diagnosis not present

## 2023-11-12 DIAGNOSIS — I4891 Unspecified atrial fibrillation: Secondary | ICD-10-CM | POA: Diagnosis not present

## 2023-11-12 DIAGNOSIS — S32010D Wedge compression fracture of first lumbar vertebra, subsequent encounter for fracture with routine healing: Secondary | ICD-10-CM | POA: Diagnosis not present

## 2023-11-12 DIAGNOSIS — I714 Abdominal aortic aneurysm, without rupture, unspecified: Secondary | ICD-10-CM | POA: Diagnosis not present

## 2023-11-13 DIAGNOSIS — S32010D Wedge compression fracture of first lumbar vertebra, subsequent encounter for fracture with routine healing: Secondary | ICD-10-CM | POA: Diagnosis not present

## 2023-11-13 DIAGNOSIS — I714 Abdominal aortic aneurysm, without rupture, unspecified: Secondary | ICD-10-CM | POA: Diagnosis not present

## 2023-11-13 DIAGNOSIS — J188 Other pneumonia, unspecified organism: Secondary | ICD-10-CM | POA: Diagnosis not present

## 2023-11-13 DIAGNOSIS — I4891 Unspecified atrial fibrillation: Secondary | ICD-10-CM | POA: Diagnosis not present

## 2023-11-13 DIAGNOSIS — J690 Pneumonitis due to inhalation of food and vomit: Secondary | ICD-10-CM | POA: Diagnosis not present

## 2023-11-13 DIAGNOSIS — D72829 Elevated white blood cell count, unspecified: Secondary | ICD-10-CM | POA: Diagnosis not present

## 2023-11-17 DIAGNOSIS — I4891 Unspecified atrial fibrillation: Secondary | ICD-10-CM | POA: Diagnosis not present

## 2023-11-17 DIAGNOSIS — S32010D Wedge compression fracture of first lumbar vertebra, subsequent encounter for fracture with routine healing: Secondary | ICD-10-CM | POA: Diagnosis not present

## 2023-11-17 DIAGNOSIS — D72829 Elevated white blood cell count, unspecified: Secondary | ICD-10-CM | POA: Diagnosis not present

## 2023-11-17 DIAGNOSIS — J690 Pneumonitis due to inhalation of food and vomit: Secondary | ICD-10-CM | POA: Diagnosis not present

## 2023-11-17 DIAGNOSIS — I714 Abdominal aortic aneurysm, without rupture, unspecified: Secondary | ICD-10-CM | POA: Diagnosis not present

## 2023-11-17 DIAGNOSIS — J188 Other pneumonia, unspecified organism: Secondary | ICD-10-CM | POA: Diagnosis not present

## 2023-11-18 DIAGNOSIS — J690 Pneumonitis due to inhalation of food and vomit: Secondary | ICD-10-CM | POA: Diagnosis not present

## 2023-11-18 DIAGNOSIS — S32010D Wedge compression fracture of first lumbar vertebra, subsequent encounter for fracture with routine healing: Secondary | ICD-10-CM | POA: Diagnosis not present

## 2023-11-18 DIAGNOSIS — J188 Other pneumonia, unspecified organism: Secondary | ICD-10-CM | POA: Diagnosis not present

## 2023-11-18 DIAGNOSIS — I714 Abdominal aortic aneurysm, without rupture, unspecified: Secondary | ICD-10-CM | POA: Diagnosis not present

## 2023-11-18 DIAGNOSIS — I4891 Unspecified atrial fibrillation: Secondary | ICD-10-CM | POA: Diagnosis not present

## 2023-11-18 DIAGNOSIS — D72829 Elevated white blood cell count, unspecified: Secondary | ICD-10-CM | POA: Diagnosis not present

## 2023-11-19 DIAGNOSIS — I714 Abdominal aortic aneurysm, without rupture, unspecified: Secondary | ICD-10-CM | POA: Diagnosis not present

## 2023-11-19 DIAGNOSIS — S32010D Wedge compression fracture of first lumbar vertebra, subsequent encounter for fracture with routine healing: Secondary | ICD-10-CM | POA: Diagnosis not present

## 2023-11-19 DIAGNOSIS — D72829 Elevated white blood cell count, unspecified: Secondary | ICD-10-CM | POA: Diagnosis not present

## 2023-11-19 DIAGNOSIS — I4891 Unspecified atrial fibrillation: Secondary | ICD-10-CM | POA: Diagnosis not present

## 2023-11-19 DIAGNOSIS — J690 Pneumonitis due to inhalation of food and vomit: Secondary | ICD-10-CM | POA: Diagnosis not present

## 2023-11-19 DIAGNOSIS — J188 Other pneumonia, unspecified organism: Secondary | ICD-10-CM | POA: Diagnosis not present

## 2023-11-23 DIAGNOSIS — J189 Pneumonia, unspecified organism: Secondary | ICD-10-CM | POA: Diagnosis not present

## 2023-11-23 DIAGNOSIS — R0902 Hypoxemia: Secondary | ICD-10-CM | POA: Diagnosis not present

## 2023-11-23 DIAGNOSIS — D72829 Elevated white blood cell count, unspecified: Secondary | ICD-10-CM | POA: Diagnosis not present

## 2023-11-23 DIAGNOSIS — R0609 Other forms of dyspnea: Secondary | ICD-10-CM | POA: Diagnosis not present

## 2023-11-24 DIAGNOSIS — R2681 Unsteadiness on feet: Secondary | ICD-10-CM | POA: Diagnosis not present

## 2023-11-24 DIAGNOSIS — Z23 Encounter for immunization: Secondary | ICD-10-CM | POA: Diagnosis not present

## 2023-11-24 DIAGNOSIS — Z6825 Body mass index (BMI) 25.0-25.9, adult: Secondary | ICD-10-CM | POA: Diagnosis not present

## 2023-11-24 DIAGNOSIS — R600 Localized edema: Secondary | ICD-10-CM | POA: Diagnosis not present

## 2023-11-24 DIAGNOSIS — I714 Abdominal aortic aneurysm, without rupture, unspecified: Secondary | ICD-10-CM | POA: Diagnosis not present

## 2023-11-24 DIAGNOSIS — I2510 Atherosclerotic heart disease of native coronary artery without angina pectoris: Secondary | ICD-10-CM | POA: Diagnosis not present

## 2023-11-24 DIAGNOSIS — S32010D Wedge compression fracture of first lumbar vertebra, subsequent encounter for fracture with routine healing: Secondary | ICD-10-CM | POA: Diagnosis not present

## 2023-11-24 DIAGNOSIS — J188 Other pneumonia, unspecified organism: Secondary | ICD-10-CM | POA: Diagnosis not present

## 2023-11-24 DIAGNOSIS — K219 Gastro-esophageal reflux disease without esophagitis: Secondary | ICD-10-CM | POA: Diagnosis not present

## 2023-11-24 DIAGNOSIS — J690 Pneumonitis due to inhalation of food and vomit: Secondary | ICD-10-CM | POA: Diagnosis not present

## 2023-11-24 DIAGNOSIS — R0902 Hypoxemia: Secondary | ICD-10-CM | POA: Diagnosis not present

## 2023-11-24 DIAGNOSIS — D72829 Elevated white blood cell count, unspecified: Secondary | ICD-10-CM | POA: Diagnosis not present

## 2023-11-24 DIAGNOSIS — Z9181 History of falling: Secondary | ICD-10-CM | POA: Diagnosis not present

## 2023-11-24 DIAGNOSIS — R0609 Other forms of dyspnea: Secondary | ICD-10-CM | POA: Diagnosis not present

## 2023-11-24 DIAGNOSIS — R5381 Other malaise: Secondary | ICD-10-CM | POA: Diagnosis not present

## 2023-11-24 DIAGNOSIS — L89102 Pressure ulcer of unspecified part of back, stage 2: Secondary | ICD-10-CM | POA: Diagnosis not present

## 2023-11-24 DIAGNOSIS — Z950 Presence of cardiac pacemaker: Secondary | ICD-10-CM | POA: Diagnosis not present

## 2023-11-24 DIAGNOSIS — Z7901 Long term (current) use of anticoagulants: Secondary | ICD-10-CM | POA: Diagnosis not present

## 2023-11-24 DIAGNOSIS — I10 Essential (primary) hypertension: Secondary | ICD-10-CM | POA: Diagnosis not present

## 2023-11-24 DIAGNOSIS — I4891 Unspecified atrial fibrillation: Secondary | ICD-10-CM | POA: Diagnosis not present

## 2023-11-25 DIAGNOSIS — I714 Abdominal aortic aneurysm, without rupture, unspecified: Secondary | ICD-10-CM | POA: Diagnosis not present

## 2023-11-25 DIAGNOSIS — J690 Pneumonitis due to inhalation of food and vomit: Secondary | ICD-10-CM | POA: Diagnosis not present

## 2023-11-25 DIAGNOSIS — S32010D Wedge compression fracture of first lumbar vertebra, subsequent encounter for fracture with routine healing: Secondary | ICD-10-CM | POA: Diagnosis not present

## 2023-11-25 DIAGNOSIS — D72829 Elevated white blood cell count, unspecified: Secondary | ICD-10-CM | POA: Diagnosis not present

## 2023-11-25 DIAGNOSIS — I4891 Unspecified atrial fibrillation: Secondary | ICD-10-CM | POA: Diagnosis not present

## 2023-11-25 DIAGNOSIS — J188 Other pneumonia, unspecified organism: Secondary | ICD-10-CM | POA: Diagnosis not present

## 2023-11-26 DIAGNOSIS — S32010D Wedge compression fracture of first lumbar vertebra, subsequent encounter for fracture with routine healing: Secondary | ICD-10-CM | POA: Diagnosis not present

## 2023-11-26 DIAGNOSIS — I714 Abdominal aortic aneurysm, without rupture, unspecified: Secondary | ICD-10-CM | POA: Diagnosis not present

## 2023-11-26 DIAGNOSIS — J690 Pneumonitis due to inhalation of food and vomit: Secondary | ICD-10-CM | POA: Diagnosis not present

## 2023-11-26 DIAGNOSIS — J188 Other pneumonia, unspecified organism: Secondary | ICD-10-CM | POA: Diagnosis not present

## 2023-11-26 DIAGNOSIS — I4891 Unspecified atrial fibrillation: Secondary | ICD-10-CM | POA: Diagnosis not present

## 2023-11-26 DIAGNOSIS — D72829 Elevated white blood cell count, unspecified: Secondary | ICD-10-CM | POA: Diagnosis not present

## 2023-12-01 ENCOUNTER — Encounter: Payer: Self-pay | Admitting: Radiology

## 2023-12-02 DIAGNOSIS — S32010D Wedge compression fracture of first lumbar vertebra, subsequent encounter for fracture with routine healing: Secondary | ICD-10-CM | POA: Diagnosis not present

## 2023-12-02 DIAGNOSIS — I4891 Unspecified atrial fibrillation: Secondary | ICD-10-CM | POA: Diagnosis not present

## 2023-12-02 DIAGNOSIS — J188 Other pneumonia, unspecified organism: Secondary | ICD-10-CM | POA: Diagnosis not present

## 2023-12-02 DIAGNOSIS — D72829 Elevated white blood cell count, unspecified: Secondary | ICD-10-CM | POA: Diagnosis not present

## 2023-12-02 DIAGNOSIS — J690 Pneumonitis due to inhalation of food and vomit: Secondary | ICD-10-CM | POA: Diagnosis not present

## 2023-12-02 DIAGNOSIS — I714 Abdominal aortic aneurysm, without rupture, unspecified: Secondary | ICD-10-CM | POA: Diagnosis not present

## 2023-12-03 DIAGNOSIS — I714 Abdominal aortic aneurysm, without rupture, unspecified: Secondary | ICD-10-CM | POA: Diagnosis not present

## 2023-12-03 DIAGNOSIS — S32010D Wedge compression fracture of first lumbar vertebra, subsequent encounter for fracture with routine healing: Secondary | ICD-10-CM | POA: Diagnosis not present

## 2023-12-03 DIAGNOSIS — J188 Other pneumonia, unspecified organism: Secondary | ICD-10-CM | POA: Diagnosis not present

## 2023-12-03 DIAGNOSIS — I4891 Unspecified atrial fibrillation: Secondary | ICD-10-CM | POA: Diagnosis not present

## 2023-12-03 DIAGNOSIS — J690 Pneumonitis due to inhalation of food and vomit: Secondary | ICD-10-CM | POA: Diagnosis not present

## 2023-12-03 DIAGNOSIS — D72829 Elevated white blood cell count, unspecified: Secondary | ICD-10-CM | POA: Diagnosis not present

## 2023-12-11 DIAGNOSIS — J188 Other pneumonia, unspecified organism: Secondary | ICD-10-CM | POA: Diagnosis not present

## 2023-12-11 DIAGNOSIS — I714 Abdominal aortic aneurysm, without rupture, unspecified: Secondary | ICD-10-CM | POA: Diagnosis not present

## 2023-12-11 DIAGNOSIS — J690 Pneumonitis due to inhalation of food and vomit: Secondary | ICD-10-CM | POA: Diagnosis not present

## 2023-12-11 DIAGNOSIS — S32010D Wedge compression fracture of first lumbar vertebra, subsequent encounter for fracture with routine healing: Secondary | ICD-10-CM | POA: Diagnosis not present

## 2023-12-11 DIAGNOSIS — I4891 Unspecified atrial fibrillation: Secondary | ICD-10-CM | POA: Diagnosis not present

## 2023-12-11 DIAGNOSIS — D72829 Elevated white blood cell count, unspecified: Secondary | ICD-10-CM | POA: Diagnosis not present

## 2023-12-17 DIAGNOSIS — M79672 Pain in left foot: Secondary | ICD-10-CM | POA: Diagnosis not present

## 2023-12-17 DIAGNOSIS — M79671 Pain in right foot: Secondary | ICD-10-CM | POA: Diagnosis not present

## 2023-12-17 DIAGNOSIS — I739 Peripheral vascular disease, unspecified: Secondary | ICD-10-CM | POA: Diagnosis not present

## 2023-12-17 DIAGNOSIS — L110 Acquired keratosis follicularis: Secondary | ICD-10-CM | POA: Diagnosis not present

## 2023-12-18 DIAGNOSIS — S32010D Wedge compression fracture of first lumbar vertebra, subsequent encounter for fracture with routine healing: Secondary | ICD-10-CM | POA: Diagnosis not present

## 2023-12-18 DIAGNOSIS — J690 Pneumonitis due to inhalation of food and vomit: Secondary | ICD-10-CM | POA: Diagnosis not present

## 2023-12-18 DIAGNOSIS — J188 Other pneumonia, unspecified organism: Secondary | ICD-10-CM | POA: Diagnosis not present

## 2023-12-18 DIAGNOSIS — I4891 Unspecified atrial fibrillation: Secondary | ICD-10-CM | POA: Diagnosis not present

## 2023-12-18 DIAGNOSIS — D72829 Elevated white blood cell count, unspecified: Secondary | ICD-10-CM | POA: Diagnosis not present

## 2023-12-18 DIAGNOSIS — I714 Abdominal aortic aneurysm, without rupture, unspecified: Secondary | ICD-10-CM | POA: Diagnosis not present

## 2023-12-19 DIAGNOSIS — I4891 Unspecified atrial fibrillation: Secondary | ICD-10-CM | POA: Diagnosis not present

## 2023-12-19 DIAGNOSIS — I714 Abdominal aortic aneurysm, without rupture, unspecified: Secondary | ICD-10-CM | POA: Diagnosis not present

## 2023-12-19 DIAGNOSIS — J188 Other pneumonia, unspecified organism: Secondary | ICD-10-CM | POA: Diagnosis not present

## 2023-12-19 DIAGNOSIS — S32010D Wedge compression fracture of first lumbar vertebra, subsequent encounter for fracture with routine healing: Secondary | ICD-10-CM | POA: Diagnosis not present

## 2023-12-19 DIAGNOSIS — J690 Pneumonitis due to inhalation of food and vomit: Secondary | ICD-10-CM | POA: Diagnosis not present

## 2023-12-19 DIAGNOSIS — D72829 Elevated white blood cell count, unspecified: Secondary | ICD-10-CM | POA: Diagnosis not present

## 2023-12-24 ENCOUNTER — Ambulatory Visit: Payer: Medicare Other

## 2023-12-24 DIAGNOSIS — I495 Sick sinus syndrome: Secondary | ICD-10-CM

## 2023-12-26 LAB — CUP PACEART REMOTE DEVICE CHECK
Battery Remaining Longevity: 55 mo
Battery Remaining Percentage: 46 %
Battery Voltage: 2.99 V
Brady Statistic AP VP Percent: 1 %
Brady Statistic AP VS Percent: 85 %
Brady Statistic AS VP Percent: 1 %
Brady Statistic AS VS Percent: 15 %
Brady Statistic RA Percent Paced: 83 %
Brady Statistic RV Percent Paced: 1 %
Date Time Interrogation Session: 20251126020013
Implantable Lead Connection Status: 753985
Implantable Lead Connection Status: 753985
Implantable Lead Implant Date: 20191105
Implantable Lead Implant Date: 20191105
Implantable Lead Location: 753859
Implantable Lead Location: 753860
Implantable Pulse Generator Implant Date: 20191105
Lead Channel Impedance Value: 380 Ohm
Lead Channel Impedance Value: 430 Ohm
Lead Channel Pacing Threshold Amplitude: 0.375 V
Lead Channel Pacing Threshold Amplitude: 0.75 V
Lead Channel Pacing Threshold Pulse Width: 0.5 ms
Lead Channel Pacing Threshold Pulse Width: 0.5 ms
Lead Channel Sensing Intrinsic Amplitude: 2.5 mV
Lead Channel Sensing Intrinsic Amplitude: 5.1 mV
Lead Channel Setting Pacing Amplitude: 1 V
Lead Channel Setting Pacing Amplitude: 1.375
Lead Channel Setting Pacing Pulse Width: 0.5 ms
Lead Channel Setting Sensing Sensitivity: 2 mV
Pulse Gen Model: 2272
Pulse Gen Serial Number: 9079121

## 2023-12-29 NOTE — Progress Notes (Signed)
 Remote PPM Transmission

## 2023-12-30 ENCOUNTER — Ambulatory Visit: Payer: Self-pay | Admitting: Cardiovascular Disease

## 2024-01-15 ENCOUNTER — Other Ambulatory Visit: Payer: Self-pay | Admitting: Cardiovascular Disease

## 2024-02-15 ENCOUNTER — Other Ambulatory Visit: Payer: Self-pay | Admitting: Cardiology

## 2024-03-24 ENCOUNTER — Ambulatory Visit: Payer: Medicare Other
